# Patient Record
Sex: Male | Born: 1965 | Race: Black or African American | Hispanic: No | Marital: Single | Smoking: Former smoker
Health system: Southern US, Community
[De-identification: ages and names within clinical notes are randomized; demographics above are authoritative.]

## PROBLEM LIST (undated history)

## (undated) DIAGNOSIS — M542 Cervicalgia: Secondary | ICD-10-CM

## (undated) DIAGNOSIS — I10 Essential (primary) hypertension: Secondary | ICD-10-CM

## (undated) DIAGNOSIS — R29898 Other symptoms and signs involving the musculoskeletal system: Secondary | ICD-10-CM

## (undated) DIAGNOSIS — F419 Anxiety disorder, unspecified: Secondary | ICD-10-CM

## (undated) DIAGNOSIS — F32A Depression, unspecified: Secondary | ICD-10-CM

## (undated) DIAGNOSIS — R0600 Dyspnea, unspecified: Secondary | ICD-10-CM

## (undated) DIAGNOSIS — F329 Major depressive disorder, single episode, unspecified: Secondary | ICD-10-CM

## (undated) HISTORY — PX: NO PAST SURGERIES: SHX2092

## (undated) HISTORY — PX: MULTIPLE TOOTH EXTRACTIONS: SHX2053

---

## 2005-12-31 ENCOUNTER — Ambulatory Visit: Payer: Self-pay | Admitting: Otolaryngology

## 2011-08-10 ENCOUNTER — Emergency Department: Payer: Self-pay | Admitting: Emergency Medicine

## 2012-09-17 ENCOUNTER — Ambulatory Visit: Payer: Self-pay | Admitting: Family Medicine

## 2012-09-22 ENCOUNTER — Ambulatory Visit: Payer: Self-pay | Admitting: Family Medicine

## 2017-06-30 ENCOUNTER — Emergency Department: Payer: Self-pay

## 2017-06-30 ENCOUNTER — Encounter: Payer: Self-pay | Admitting: Emergency Medicine

## 2017-06-30 ENCOUNTER — Emergency Department
Admission: EM | Admit: 2017-06-30 | Discharge: 2017-06-30 | Disposition: A | Discharge status: Home / Self Care | Payer: Self-pay | Attending: Emergency Medicine | Admitting: Emergency Medicine

## 2017-06-30 DIAGNOSIS — F17210 Nicotine dependence, cigarettes, uncomplicated: Secondary | ICD-10-CM | POA: Insufficient documentation

## 2017-06-30 DIAGNOSIS — M4722 Other spondylosis with radiculopathy, cervical region: Secondary | ICD-10-CM | POA: Insufficient documentation

## 2017-06-30 DIAGNOSIS — I10 Essential (primary) hypertension: Secondary | ICD-10-CM | POA: Insufficient documentation

## 2017-06-30 DIAGNOSIS — R202 Paresthesia of skin: Secondary | ICD-10-CM | POA: Insufficient documentation

## 2017-06-30 DIAGNOSIS — M5410 Radiculopathy, site unspecified: Secondary | ICD-10-CM | POA: Insufficient documentation

## 2017-06-30 DIAGNOSIS — M542 Cervicalgia: Secondary | ICD-10-CM | POA: Insufficient documentation

## 2017-06-30 HISTORY — DX: Essential (primary) hypertension: I10

## 2017-06-30 MED ORDER — KETOROLAC TROMETHAMINE 30 MG/ML IJ SOLN
30.0000 mg | Freq: Once | INTRAMUSCULAR | Status: AC
  Administered 2017-06-30: 30 mg via INTRAMUSCULAR
  Filled 2017-06-30: qty 1

## 2017-06-30 MED ORDER — CYCLOBENZAPRINE HCL 5 MG PO TABS: ORAL_TABLET | ORAL | 0 refills | Status: DC

## 2017-06-30 MED ORDER — OXYCODONE-ACETAMINOPHEN 5-325 MG PO TABS
1.0000 | ORAL_TABLET | Freq: Once | ORAL | Status: AC
  Administered 2017-06-30: 1 via ORAL
  Filled 2017-06-30: qty 1

## 2017-06-30 MED ORDER — METHYLPREDNISOLONE SODIUM SUCC 125 MG IJ SOLR
125.0000 mg | Freq: Once | INTRAMUSCULAR | Status: AC
  Administered 2017-06-30: 125 mg via INTRAMUSCULAR
  Filled 2017-06-30: qty 2

## 2017-06-30 MED ORDER — IBUPROFEN 800 MG PO TABS: 800.0000 mg | ORAL_TABLET | Freq: Three times a day (TID) | ORAL | 0 refills | Status: DC | PRN

## 2017-06-30 MED ORDER — PREDNISONE 10 MG PO TABS: ORAL_TABLET | ORAL | 0 refills | Status: DC

## 2017-06-30 NOTE — ED Triage Notes (Signed)
Pt here with c/o neck pain that started a week ago, states he thinks he has a disc issue, states pain to right arm began last week as well with some tingling and occasional numbness in his fingers. Pt unable to sit still denies cp.

## 2017-06-30 NOTE — ED Provider Notes (Signed)
[facility] Emergency Department Provider Note  ____________________________________________  Time seen: Approximately 7:07 PM  I have reviewed the triage vital signs and the nursing notes.   HISTORY  Chief Complaint Neck Pain    HPI James Padilla is a 51 y.o. male that presents the emergency department for numbness and tingling in right arm for several years that worsened 1 month ago.  Patient states that he has a disc ruptured in his neck and causes him to get numbness and tingling in his right hand.  He had a CT scan done several years ago and was supposed to get surgery but never did.  Symptoms worsened 1 month ago.  He has been having difficulty sleeping this week due to tingling.  Symptoms have not changed in character but are more constant than they have been previously.  Wife has been rubbing cream on his arm without relief.  He denies any trauma.  No shortness of breath, chest pain, nausea, vomiting, abdominal pain.   Past Medical History:  Diagnosis Date  . Hypertension     There are no active problems to display for this patient.   History reviewed. No pertinent surgical history.  Prior to Admission medications   Medication Sig Start Date End Date Taking? Authorizing Provider  cyclobenzaprine (FLEXERIL) 5 MG tablet Take 1-2 tablets 3 times daily as needed 06/30/17   Laban Emperor, PA-C  ibuprofen (ADVIL,MOTRIN) 800 MG tablet Take 1 tablet (800 mg total) every 8 (eight) hours as needed by mouth. 06/30/17   Laban Emperor, PA-C  predniSONE (DELTASONE) 10 MG tablet Take 6 tablets on day 1, take 5 tablets on day 2, take 4 tablets on day 3, take 3 tablets on day 4, take 2 tablets on day 5, take 1 tablet on day 6 06/30/17   Laban Emperor, PA-C    Allergies Patient has no known allergies.  No family history on file.  Social History Social History   Tobacco Use  . Smoking status: Current Every Day Smoker    Packs/day: 1.00    Types: Cigarettes   . Smokeless tobacco: Never Used  Substance Use Topics  . Alcohol use: Yes    Comment: occas.   . Drug use: Yes    Comment: states not recently     Review of Systems  Constitutional: No fever/chills Cardiovascular: No chest pain. Respiratory: No cough. No SOB. Gastrointestinal: No abdominal pain.  No nausea, no vomiting.  Skin: Negative for rash, abrasions, lacerations, ecchymosis.   ____________________________________________   PHYSICAL EXAM:  VITAL SIGNS: ED Triage Vitals  Enc Vitals Group     BP 06/30/17 1628 (!) 173/99     Pulse Rate 06/30/17 1627 65     Resp 06/30/17 1627 18     Temp 06/30/17 1627 98.2 F (36.8 C)     Temp Source 06/30/17 1627 Oral     SpO2 06/30/17 1627 99 %     Weight 06/30/17 1628 142 lb (64.4 kg)     Height 06/30/17 1628 5\' 6"  (1.676 m)     Head Circumference --      Peak Flow --      Pain Score 06/30/17 1627 10     Pain Loc --      Pain Edu? --      Excl. in [place]? --      Constitutional: Alert and oriented. Well appearing and in no acute distress. Eyes: Conjunctivae are normal. PERRL. EOMI. Head: Atraumatic. ENT:  Ears:      Nose: No congestion/rhinnorhea.      Mouth/Throat: Mucous membranes are moist.  Neck: No stridor. No cervical spine tenderness to palpation. Cardiovascular: Normal rate, regular rhythm.  Good peripheral circulation.  Symmetric radial pulses bilaterally. Respiratory: Normal respiratory effort without tachypnea or retractions. Lungs CTAB. Good air entry to the bases with no decreased or absent breath sounds. Musculoskeletal: Full range of motion to all extremities. No gross deformities appreciated.  Tenderness to palpation throughout upper right back and right shoulder. Neurologic:  Normal speech and language. No gross focal neurologic deficits are appreciated.  Skin:  Skin is warm, dry and intact. No rash noted.   ____________________________________________   LABS (all labs ordered are listed, but only  abnormal results are displayed)  Labs Reviewed - No data to display ____________________________________________  EKG   ____________________________________________  RADIOLOGY Robinette Haines, personally viewed and evaluated these images (plain radiographs) as part of my medical decision making, as well as reviewing the written report by the radiologist.  Dg Cervical Spine 2-3 Views  Result Date: 06/30/2017 CLINICAL DATA:  Neck pain starting week ago. EXAM: CERVICAL SPINE - 2-3 VIEW COMPARISON:  09/22/2012 MRI, 09/17/2012 cervical spine radiographs. FINDINGS: Reversal of cervical lordosis with multilevel degenerative disc disease, mild at C2-3, C3-4 and moderate-to-marked from C4 through C7. No jumped or perched facets. No acute fracture. No suspicious osseous lesions. Uncovertebral joint osteoarthritis on the right at C3-4, bilaterally at C4-5 and C5-6. IMPRESSION: Cervical spondylosis with reversal of cervical lordosis. Multilevel degenerative disc and facet arthropathy with uncovertebral joint osteoarthritis. No acute fracture. Electronically Signed   By: Sharde Gover Royalty M.D.   On: 06/30/2017 18:06    ____________________________________________    PROCEDURES  Procedure(s) performed:    Procedures    Medications  oxyCODONE-acetaminophen (PERCOCET/ROXICET) 5-325 MG per tablet 1 tablet (1 tablet Oral Given 06/30/17 1736)  ketorolac (TORADOL) 30 MG/ML injection 30 mg (30 mg Intramuscular Given 06/30/17 1834)  methylPREDNISolone sodium succinate (SOLU-MEDROL) 125 mg/2 mL injection 125 mg (125 mg Intramuscular Given 06/30/17 1834)     ____________________________________________   INITIAL IMPRESSION / ASSESSMENT AND PLAN / ED COURSE  Pertinent labs & imaging results that were available during my care of the patient were reviewed by me and considered in my medical decision making (see chart for details).  Review of the [place] CSRS was performed in accordance of the [place] prior to  dispensing any controlled drugs.   Patient presented to the emergency department for worsening radicular symptoms.  Vital signs and exam are reassuring.  Cervical x-ray consistent with osteoarthritis and cervical spondylosis with reversal of cervical lordosis.  No changes on EKG.  Patient felt better after oxycodone. He was then given IM Toradol, Solu-Medrol.  Patient will be discharged home with prescriptions for prednisone, ibuprofen and Flexeril.  Patient will follow up with previous surgeon. Patient is given ED precautions to return to the ED for any worsening or new symptoms.     ____________________________________________  FINAL CLINICAL IMPRESSION(S) / ED DIAGNOSES  Final diagnoses:  Neck pain  Radiculopathy, unspecified spinal region  Osteoarthritis of spine with radiculopathy, cervical region      NEW MEDICATIONS STARTED DURING THIS VISIT:  This SmartLink is deprecated. Use AVSMEDLIST instead to display the medication list for a patient.      This chart was dictated using voice recognition software/Dragon. Despite best efforts to proofread, errors can occur which can change the meaning. Any change was purely unintentional.    Earleen Newport,  Genevie Cheshire 06/30/17 1912    Hinda Kehr, MD 06/30/17 [phone]

## 2017-09-30 ENCOUNTER — Other Ambulatory Visit: Payer: Self-pay

## 2017-09-30 ENCOUNTER — Encounter: Payer: Self-pay | Admitting: Emergency Medicine

## 2017-09-30 ENCOUNTER — Emergency Department
Admission: EM | Admit: 2017-09-30 | Discharge: 2017-09-30 | Disposition: A | Discharge status: Home / Self Care | Payer: Self-pay | Attending: Emergency Medicine | Admitting: Emergency Medicine

## 2017-09-30 DIAGNOSIS — I10 Essential (primary) hypertension: Secondary | ICD-10-CM | POA: Insufficient documentation

## 2017-09-30 DIAGNOSIS — L0291 Cutaneous abscess, unspecified: Secondary | ICD-10-CM

## 2017-09-30 DIAGNOSIS — L02212 Cutaneous abscess of back [any part, except buttock]: Secondary | ICD-10-CM | POA: Insufficient documentation

## 2017-09-30 DIAGNOSIS — F17210 Nicotine dependence, cigarettes, uncomplicated: Secondary | ICD-10-CM | POA: Insufficient documentation

## 2017-09-30 MED ORDER — LIDOCAINE HCL (PF) 1 % IJ SOLN
INTRAMUSCULAR | Status: AC
  Administered 2017-09-30: 10 mL via INTRADERMAL
  Filled 2017-09-30: qty 10

## 2017-09-30 MED ORDER — SULFAMETHOXAZOLE-TRIMETHOPRIM 800-160 MG PO TABS
1.0000 | ORAL_TABLET | Freq: Once | ORAL | Status: AC
  Administered 2017-09-30: 1 via ORAL
  Filled 2017-09-30: qty 1

## 2017-09-30 MED ORDER — SULFAMETHOXAZOLE-TRIMETHOPRIM 800-160 MG PO TABS: 1.0000 | ORAL_TABLET | Freq: Two times a day (BID) | ORAL | 0 refills | Status: DC

## 2017-09-30 MED ORDER — OXYCODONE-ACETAMINOPHEN 5-325 MG PO TABS: 1.0000 | ORAL_TABLET | Freq: Four times a day (QID) | ORAL | 0 refills | Status: DC | PRN

## 2017-09-30 MED ORDER — OXYCODONE HCL 5 MG PO TABS
10.0000 mg | ORAL_TABLET | Freq: Once | ORAL | Status: AC
  Administered 2017-09-30: 10 mg via ORAL
  Filled 2017-09-30: qty 2

## 2017-09-30 MED ORDER — LIDOCAINE HCL (PF) 1 % IJ SOLN
10.0000 mL | Freq: Once | INTRAMUSCULAR | Status: AC
  Administered 2017-09-30: 10 mL via INTRADERMAL
  Filled 2017-09-30: qty 10

## 2017-09-30 NOTE — ED Notes (Signed)
Patient educated about not driving or performing other critical tasks (such as operating heavy machinery, caring for infant/toddler/child) due to sedative nature of narcotic medications received while in the ED.  Pt/caregiver verbalized understanding.   

## 2017-09-30 NOTE — ED Notes (Signed)
Pt stating that he has a "knot on my back." Pt has a dressed area on the upper left portion of his back . Pt is unsure of fevers. Pt stating it drained a yellow/bloody drainage. Old drainage noted. No edema noted.

## 2017-09-30 NOTE — ED Triage Notes (Signed)
Presents with a possible abscess area to back

## 2017-09-30 NOTE — ED Notes (Signed)
ED Provider at bedside for procedure. 

## 2017-10-01 NOTE — ED Provider Notes (Signed)
[facility] Emergency Department Provider Note  ____________________________________________  Time seen: Approximately 7:07 PM  I have reviewed the triage vital signs and the nursing notes.   HISTORY  Chief Complaint Abscess   HPI James Padilla is a 52 y.o. male who presents to the emergency department for evaluation and treatment of a lesion on his left upper back that has been present for several months, but has not been tender or draining as it is now.  He denies fever.  He denies previous skin infection.  No alleviating measures have been attempted for this complaint prior to arrival.  Past Medical History:  Diagnosis Date  . Hypertension     There are no active problems to display for this patient.   History reviewed. No pertinent surgical history.  Prior to Admission medications   Medication Sig Start Date End Date Taking? Authorizing Provider  cyclobenzaprine (FLEXERIL) 5 MG tablet Take 1-2 tablets 3 times daily as needed 06/30/17   Laban Emperor, PA-C  ibuprofen (ADVIL,MOTRIN) 800 MG tablet Take 1 tablet (800 mg total) every 8 (eight) hours as needed by mouth. 06/30/17   Laban Emperor, PA-C  oxyCODONE-acetaminophen (PERCOCET) 5-325 MG tablet Take 1 tablet by mouth every 6 (six) hours as needed for severe pain. 09/30/17   Gem Ducre, Johnette Abraham B, FNP  predniSONE (DELTASONE) 10 MG tablet Take 6 tablets on day 1, take 5 tablets on day 2, take 4 tablets on day 3, take 3 tablets on day 4, take 2 tablets on day 5, take 1 tablet on day 6 06/30/17   Laban Emperor, PA-C  sulfamethoxazole-trimethoprim (BACTRIM DS,SEPTRA DS) 800-160 MG tablet Take 1 tablet by mouth 2 (two) times daily. 09/30/17   Victorino Dike, FNP    Allergies Patient has no known allergies.  No family history on file.  Social History Social History   Tobacco Use  . Smoking status: Current Every Day Smoker    Packs/day: 1.00    Types: Cigarettes  . Smokeless tobacco: Never Used  Substance  Use Topics  . Alcohol use: Yes    Comment: occas.   . Drug use: Yes    Comment: states not recently    Review of Systems  Constitutional: Negative for fever. Respiratory: Negative for cough or shortness of breath.  Musculoskeletal: Negative for myalgias Skin: Positive for lesion on the upper back Neurological: Negative for numbness or paresthesias. ____________________________________________   PHYSICAL EXAM:  VITAL SIGNS: ED Triage Vitals  Enc Vitals Group     BP 09/30/17 1845 (!) 147/87     Pulse Rate 09/30/17 2003 67     Resp 09/30/17 1845 20     Temp 09/30/17 1845 98.5 F (36.9 C)     Temp Source 09/30/17 1845 Oral     SpO2 09/30/17 1845 100 %     Weight 09/30/17 1846 140 lb (63.5 kg)     Height 09/30/17 1846 5\' 7"  (1.702 m)     Head Circumference --      Peak Flow --      Pain Score 09/30/17 1846 8     Pain Loc --      Pain Edu? --      Excl. in [place]? --      Constitutional: Well appearing. Eyes: Conjunctivae are clear without discharge or drainage. Nose: No rhinorrhea noted. Mouth/Throat: Airway is patent.  Neck: No stridor. Unrestricted range of motion observed.  Cardiovascular: Capillary refill is <3 seconds.  Respiratory: Respirations are even and unlabored.[name]  Musculoskeletal: Unrestricted range of motion observed. Neurologic: Awake, alert, and oriented x 4.  Skin: Approximately 5 cm fluctuant epidermal cyst that now has surrounding induration and erythema of the skin.  There is a scant amount of purulent drainage noted.  ____________________________________________   LABS (all labs ordered are listed, but only abnormal results are displayed)  Labs Reviewed - No data to display ____________________________________________  EKG  Not indicated ____________________________________________  RADIOLOGY  Not indicated ____________________________________________   PROCEDURES  .Marland KitchenIncision and Drainage Date/Time: 10/01/2017 12:05 AM Performed by:  Victorino Dike, FNP Authorized by: Victorino Dike, FNP   Consent:    Consent obtained:  Verbal   Consent given by:  Patient   Risks discussed:  Bleeding, infection, incomplete drainage and pain   Alternatives discussed:  Alternative treatment, delayed treatment and observation Location:    Type:  Abscess   Location:  Trunk   Trunk location:  Back Pre-procedure details:    Skin preparation:  Betadine Anesthesia (see MAR for exact dosages):    Anesthesia method:  Local infiltration   Local anesthetic:  Lidocaine 1% w/o epi Procedure type:    Complexity:  Complex Procedure details:    Incision types:  Stab incision   Scalpel blade:  11   Wound management:  Probed and deloculated   Drainage:  Purulent and bloody   Drainage amount:  Moderate   Wound treatment:  Drain placed   Packing materials:  1/4 in iodoform gauze Post-procedure details:    Patient tolerance of procedure:  Tolerated well, no immediate complications   ____________________________________________   INITIAL IMPRESSION / ASSESSMENT AND PLAN / ED COURSE  James Padilla is a 52 y.o. male who presents to the emergency department for treatment and evaluation of abscess to the left upper back.  Incision and drainage was completed and the patient tolerated the procedure well.  He is to return to the emergency department in 2 days for packing removal and reassessment of the wound.  He will be placed on Bactrim and given Percocet for pain.  He was advised to return to the emergency department sooner for symptoms that change or worsen.  Medications  lidocaine (PF) (XYLOCAINE) 1 % injection 10 mL (10 mLs Intradermal Given 09/30/17 1937)  oxyCODONE (Oxy IR/ROXICODONE) immediate release tablet 10 mg (10 mg Oral Given 09/30/17 2004)  sulfamethoxazole-trimethoprim (BACTRIM DS,SEPTRA DS) 800-160 MG per tablet 1 tablet (1 tablet Oral Given 09/30/17 2005)     Pertinent labs & imaging results that were available during my care of  the patient were reviewed by me and considered in my medical decision making (see chart for details). ____________________________________________   FINAL CLINICAL IMPRESSION(S) / ED DIAGNOSES  Final diagnoses:  Abscess    ED Discharge Orders        Ordered    sulfamethoxazole-trimethoprim (BACTRIM DS,SEPTRA DS) 800-160 MG tablet  2 times daily     09/30/17 2001    oxyCODONE-acetaminophen (PERCOCET) 5-325 MG tablet  Every 6 hours PRN     09/30/17 2001       Note:  This document was prepared using Dragon voice recognition software and may include unintentional dictation errors.    Victorino Dike, FNP 10/01/17 [place], [place], MD 10/01/17 1515

## 2017-10-02 ENCOUNTER — Emergency Department
Admission: EM | Admit: 2017-10-02 | Discharge: 2017-10-02 | Disposition: A | Discharge status: Home / Self Care | Payer: Self-pay | Attending: Emergency Medicine | Admitting: Emergency Medicine

## 2017-10-02 ENCOUNTER — Other Ambulatory Visit: Payer: Self-pay

## 2017-10-02 ENCOUNTER — Encounter: Payer: Self-pay | Admitting: Emergency Medicine

## 2017-10-02 DIAGNOSIS — F17210 Nicotine dependence, cigarettes, uncomplicated: Secondary | ICD-10-CM | POA: Insufficient documentation

## 2017-10-02 DIAGNOSIS — I10 Essential (primary) hypertension: Secondary | ICD-10-CM | POA: Insufficient documentation

## 2017-10-02 DIAGNOSIS — L723 Sebaceous cyst: Secondary | ICD-10-CM | POA: Insufficient documentation

## 2017-10-02 DIAGNOSIS — Z5189 Encounter for other specified aftercare: Secondary | ICD-10-CM | POA: Insufficient documentation

## 2017-10-02 MED ORDER — SULFAMETHOXAZOLE-TRIMETHOPRIM 800-160 MG PO TABS
1.0000 | ORAL_TABLET | Freq: Once | ORAL | Status: AC
  Administered 2017-10-02: 1 via ORAL
  Filled 2017-10-02: qty 1

## 2017-10-02 MED ORDER — OXYCODONE-ACETAMINOPHEN 5-325 MG PO TABS
1.0000 | ORAL_TABLET | Freq: Once | ORAL | Status: AC
  Administered 2017-10-02: 1 via ORAL
  Filled 2017-10-02: qty 1

## 2017-10-02 MED ORDER — LIDOCAINE HCL (PF) 1 % IJ SOLN
INTRAMUSCULAR | Status: AC
  Filled 2017-10-02: qty 5

## 2017-10-02 NOTE — ED Triage Notes (Signed)
States he was seen 2 days ago   Had abscess area lanced and packed    Here for packing removal

## 2017-10-02 NOTE — ED Provider Notes (Signed)
[facility] Emergency Department Provider Note   ____________________________________________   First MD Initiated Contact with Patient 10/02/17 1603     (approximate)  I have reviewed the triage vital signs and the nursing notes.   HISTORY  Chief Complaint Wound Check    HPI James Padilla is a 52 y.o. male patient here today for reevaluation of an abscess which was incised and drained 2 days ago.  Lesion was packed with iodoform gauze.  Patient reveals that he did not fill the prescription for the antibiotics and pain medication secondary to lack of funds.  Patient stated the lesion has worsened.  Patient rates pain as a 10/10.  No palates measured for complaint.  Past Medical History:  Diagnosis Date  . Hypertension     There are no active problems to display for this patient.   History reviewed. No pertinent surgical history.  Prior to Admission medications   Medication Sig Start Date End Date Taking? Authorizing Provider  cyclobenzaprine (FLEXERIL) 5 MG tablet Take 1-2 tablets 3 times daily as needed 06/30/17   Laban Emperor, PA-C  ibuprofen (ADVIL,MOTRIN) 800 MG tablet Take 1 tablet (800 mg total) every 8 (eight) hours as needed by mouth. 06/30/17   Laban Emperor, PA-C  oxyCODONE-acetaminophen (PERCOCET) 5-325 MG tablet Take 1 tablet by mouth every 6 (six) hours as needed for severe pain. 09/30/17   Triplett, Johnette Abraham B, FNP  predniSONE (DELTASONE) 10 MG tablet Take 6 tablets on day 1, take 5 tablets on day 2, take 4 tablets on day 3, take 3 tablets on day 4, take 2 tablets on day 5, take 1 tablet on day 6 06/30/17   Laban Emperor, PA-C  sulfamethoxazole-trimethoprim (BACTRIM DS,SEPTRA DS) 800-160 MG tablet Take 1 tablet by mouth 2 (two) times daily. 09/30/17   Victorino Dike, FNP    Allergies Patient has no known allergies.  No family history on file.  Social History Social History   Tobacco Use  . Smoking status: Current Every Day Smoker   Packs/day: 1.00    Types: Cigarettes  . Smokeless tobacco: Never Used  Substance Use Topics  . Alcohol use: Yes    Comment: occas.   . Drug use: Yes    Comment: states not recently    Review of Systems  Constitutional: No fever/chills Eyes: No visual changes. ENT: No sore throat. Cardiovascular: Denies chest pain. Respiratory: Denies shortness of breath. Gastrointestinal: No abdominal pain.  No nausea, no vomiting.  No diarrhea.  No constipation. Genitourinary: Negative for dysuria. Musculoskeletal: Negative for back pain. Skin: Negative for rash.  Abscess upper back Neurological: Negative for headaches, focal weakness or numbness. Endocrine:Hypertension   ____________________________________________   PHYSICAL EXAM:  VITAL SIGNS: ED Triage Vitals [10/02/17 1528]  Enc Vitals Group     BP 131/90     Pulse Rate 75     Resp 18     Temp 98.5 F (36.9 C)     Temp Source Oral     SpO2 98 %     Weight 138 lb (62.6 kg)     Height 5\' 6"  (1.676 m)     Head Circumference      Peak Flow      Pain Score      Pain Loc      Pain Edu?      Excl. in [place]?    Constitutional: Alert and oriented. Well appearing and in no acute distress. Cardiovascular: Normal rate, regular rhythm. Grossly normal  heart sounds.  Good peripheral circulation. Respiratory: Normal respiratory effort.  No retractions. Lungs CTAB. Neurologic:  Normal speech and language. No gross focal neurologic deficits are appreciated. No gait instability. Skin:  Skin is warm, dry and intact.  Nodular lesion on erythematous base upper back.   Psychiatric: Mood and affect are normal. Speech and behavior are normal.  ____________________________________________   LABS (all labs ordered are listed, but only abnormal results are displayed)  Labs Reviewed - No data to display ____________________________________________  EKG   ____________________________________________  RADIOLOGY  ED MD interpretation:     Official radiology report(s): No results found.  ____________________________________________   PROCEDURES  Procedure(s) performed: None  Procedures  Critical Care performed: No  ____________________________________________   INITIAL IMPRESSION / ASSESSMENT AND PLAN / ED COURSE  As part of my medical decision making, I reviewed the following data within the electronic MEDICAL RECORD NUMBER    Infected sebaceous cyst upper back.  Poor prognosis secondary to noncompliance of prescription medication.  Packing material was removed.  Lesion was irrigated and purulent material expressed.  Lesion was repacked and bandaged.  Patient states that he can afford the antibiotics and pain medication this evening.  Advised to return back in 2 days for wound check.      ____________________________________________   FINAL CLINICAL IMPRESSION(S) / ED DIAGNOSES  Final diagnoses:  Wound check, abscess     ED Discharge Orders    None       Note:  This document was prepared using Dragon voice recognition software and may include unintentional dictation errors.    Sable Feil, PA-C 10/02/17 1610    Schaevitz, Randall An, MD 10/02/17 [phone]

## 2017-10-02 NOTE — Discharge Instructions (Signed)
Medication as directed

## 2017-10-04 ENCOUNTER — Emergency Department
Admission: EM | Admit: 2017-10-04 | Discharge: 2017-10-04 | Disposition: A | Discharge status: Home / Self Care | Payer: Self-pay | Attending: Emergency Medicine | Admitting: Emergency Medicine

## 2017-10-04 ENCOUNTER — Encounter: Payer: Self-pay | Admitting: Emergency Medicine

## 2017-10-04 ENCOUNTER — Other Ambulatory Visit: Payer: Self-pay

## 2017-10-04 DIAGNOSIS — I10 Essential (primary) hypertension: Secondary | ICD-10-CM | POA: Insufficient documentation

## 2017-10-04 DIAGNOSIS — F17210 Nicotine dependence, cigarettes, uncomplicated: Secondary | ICD-10-CM | POA: Insufficient documentation

## 2017-10-04 DIAGNOSIS — L02212 Cutaneous abscess of back [any part, except buttock]: Secondary | ICD-10-CM | POA: Insufficient documentation

## 2017-10-04 DIAGNOSIS — Z5189 Encounter for other specified aftercare: Secondary | ICD-10-CM | POA: Insufficient documentation

## 2017-10-04 NOTE — ED Triage Notes (Signed)
Wound reckeck, states abscess drained 2 and 4 days ago.

## 2017-10-04 NOTE — Discharge Instructions (Signed)
Continue to wear dressing over the top of your wound site as needed for drainage.  Continue taking antibiotics as directed.  Begin using warm moist compresses to the area Saturday and Sunday. [facility]-up with [facility] if any continued problems.

## 2017-10-04 NOTE — ED Provider Notes (Signed)
[facility] Emergency Department Provider Note  ____________________________________________   First MD Initiated Contact with Patient 10/04/17 1025     (approximate)  I have reviewed the triage vital signs and the nursing notes.   HISTORY  Chief Complaint Wound Check   HPI James Padilla is a 52 y.o. male is here for recheck of his abscess I&D.  Patient had an abscess on his back drained 4 days ago and returned 2 days ago where it was repacked again.  On his second visit it was discovered that patient was not taking his antibiotics.  Since that time patient has gotten his antibiotics and been taking them.  He denies any problems with his back at this time.   Past Medical History:  Diagnosis Date  . Hypertension     There are no active problems to display for this patient.   History reviewed. No pertinent surgical history.  Prior to Admission medications   Medication Sig Start Date End Date Taking? Authorizing Provider  oxyCODONE-acetaminophen (PERCOCET) 5-325 MG tablet Take 1 tablet by mouth every 6 (six) hours as needed for severe pain. 09/30/17   Triplett, Johnette Abraham B, FNP  sulfamethoxazole-trimethoprim (BACTRIM DS,SEPTRA DS) 800-160 MG tablet Take 1 tablet by mouth 2 (two) times daily. 09/30/17   Victorino Dike, FNP    Allergies Patient has no known allergies.  No family history on file.  Social History Social History   Tobacco Use  . Smoking status: Current Every Day Smoker    Packs/day: 1.00    Types: Cigarettes  . Smokeless tobacco: Never Used  Substance Use Topics  . Alcohol use: Yes    Comment: occas.   . Drug use: Yes    Comment: states not recently    Review of Systems Constitutional: No fever/chills Eyes: No visual changes. Cardiovascular: Denies chest pain. Respiratory: Denies shortness of breath. Musculoskeletal: Negative for back pain. Skin: Positive for abscess. ___________________________________________   PHYSICAL  EXAM:  VITAL SIGNS: ED Triage Vitals  Enc Vitals Group     BP 10/04/17 1007 (!) 141/90     Pulse Rate 10/04/17 1007 66     Resp 10/04/17 1007 18     Temp 10/04/17 1007 (!) 97.4 F (36.3 C)     Temp Source 10/04/17 1007 Oral     SpO2 10/04/17 1007 98 %     Weight 10/04/17 1009 138 lb (62.6 kg)     Height 10/04/17 1009 5\' 6"  (1.676 m)     Head Circumference --      Peak Flow --      Pain Score 10/04/17 1009 7     Pain Loc --      Pain Edu? --      Excl. in [place]? --    Constitutional: Alert and oriented. Well appearing and in no acute distress. Eyes: Conjunctivae are normal.  Head: Atraumatic. Neck: No stridor.   Cardiovascular: Normal rate, regular rhythm. Grossly normal heart sounds.  Good peripheral circulation. Respiratory: Normal respiratory effort.  No retractions. Lungs CTAB. Musculoskeletal: Moves upper and lower extremities without any difficulty and normal gait was noted. Neurologic:  Normal speech and language. No gross focal neurologic deficits are appreciated.  Skin:  Skin is warm, dry.  Abscess mid back.  No active drainage is noted.  No surrounding cellulitis. Psychiatric: Mood and affect are normal. Speech and behavior are normal.  ____________________________________________   LABS (all labs ordered are listed, but only abnormal results are displayed)  Labs  Reviewed - No data to display   PROCEDURES  Procedure(s) performed: Packing removed by myself.  Procedures  Critical Care performed: No  ____________________________________________   INITIAL IMPRESSION / ASSESSMENT AND PLAN / ED COURSE  The packing was removed and the area does appear to be healing.  There was no active drainage today.  Patient is encouraged to continue taking his antibiotics until completely finished.  Patient was given a note to return to work on Monday.  He is to follow-up with [facility]  ____________________________________________   FINAL CLINICAL IMPRESSION(S) / ED DIAGNOSES  Final diagnoses:  Wound check, abscess     ED Discharge Orders    None       Note:  This document was prepared using Dragon voice recognition software and may include unintentional dictation errors.    Johnn Hai, PA-C 10/04/17 1152    Schuyler Amor, MD 10/04/17 [phone]

## 2018-03-06 ENCOUNTER — Ambulatory Visit
Admission: RE | Admit: 2018-03-06 | Discharge: 2018-03-06 | Disposition: A | Discharge status: Home / Self Care | Payer: Disability Insurance | Source: Ambulatory Visit | Attending: Dentistry | Admitting: Dentistry

## 2018-03-06 ENCOUNTER — Other Ambulatory Visit: Payer: Self-pay | Admitting: Dentistry

## 2018-03-06 DIAGNOSIS — I10 Essential (primary) hypertension: Secondary | ICD-10-CM | POA: Diagnosis not present

## 2018-03-06 DIAGNOSIS — M545 Low back pain: Secondary | ICD-10-CM

## 2018-03-06 DIAGNOSIS — I700 Atherosclerosis of aorta: Secondary | ICD-10-CM | POA: Diagnosis not present

## 2018-03-06 DIAGNOSIS — F329 Major depressive disorder, single episode, unspecified: Secondary | ICD-10-CM | POA: Insufficient documentation

## 2018-03-06 DIAGNOSIS — M5030 Other cervical disc degeneration, unspecified cervical region: Secondary | ICD-10-CM | POA: Insufficient documentation

## 2018-07-11 ENCOUNTER — Emergency Department: Payer: Disability Insurance

## 2018-07-11 ENCOUNTER — Other Ambulatory Visit: Payer: Self-pay

## 2018-07-11 ENCOUNTER — Encounter: Payer: Self-pay | Admitting: Emergency Medicine

## 2018-07-11 ENCOUNTER — Emergency Department
Admission: EM | Admit: 2018-07-11 | Discharge: 2018-07-11 | Disposition: A | Discharge status: Home / Self Care | Payer: Disability Insurance | Attending: Emergency Medicine | Admitting: Emergency Medicine

## 2018-07-11 DIAGNOSIS — M79601 Pain in right arm: Secondary | ICD-10-CM | POA: Insufficient documentation

## 2018-07-11 DIAGNOSIS — Z79899 Other long term (current) drug therapy: Secondary | ICD-10-CM | POA: Insufficient documentation

## 2018-07-11 DIAGNOSIS — F17210 Nicotine dependence, cigarettes, uncomplicated: Secondary | ICD-10-CM | POA: Insufficient documentation

## 2018-07-11 DIAGNOSIS — M5412 Radiculopathy, cervical region: Secondary | ICD-10-CM | POA: Insufficient documentation

## 2018-07-11 DIAGNOSIS — I10 Essential (primary) hypertension: Secondary | ICD-10-CM | POA: Insufficient documentation

## 2018-07-11 LAB — BASIC METABOLIC PANEL
ANION GAP: 7 (ref 5–15)
BUN: 11 mg/dL (ref 6–20)
CHLORIDE: 107 mmol/L (ref 98–111)
CO2: 25 mmol/L (ref 22–32)
Calcium: 8.9 mg/dL (ref 8.9–10.3)
Creatinine, Ser: 1.3 mg/dL — ABNORMAL HIGH (ref 0.61–1.24)
GFR calc non Af Amer: >60 mL/min (ref >60)
GLUCOSE: 95 mg/dL (ref 70–99)
POTASSIUM: 3.6 mmol/L (ref 3.5–5.1)
Sodium: 139 mmol/L (ref 135–145)

## 2018-07-11 LAB — CBC WITH DIFFERENTIAL/PLATELET
Abs Immature Granulocytes: 0.01 10*3/uL (ref 0.00–0.07)
BASOS ABS: 0 10*3/uL (ref 0.0–0.1)
Basophils Relative: 1 %
EOS ABS: 0.2 10*3/uL (ref 0.0–0.5)
EOS PCT: 5 %
HCT: 44.3 % (ref 39.0–52.0)
HEMOGLOBIN: 15.1 g/dL (ref 13.0–17.0)
IMMATURE GRANULOCYTES: 0 %
Lymphocytes Relative: 46 %
Lymphs Abs: 1.9 10*3/uL (ref 0.7–4.0)
MCH: 34.6 pg — ABNORMAL HIGH (ref 26.0–34.0)
MCHC: 34.1 g/dL (ref 30.0–36.0)
MCV: 101.4 fL — AB (ref 80.0–100.0)
MONO ABS: 0.3 10*3/uL (ref 0.1–1.0)
Monocytes Relative: 8 %
NEUTROS ABS: 1.6 10*3/uL — AB (ref 1.7–7.7)
Neutrophils Relative %: 40 %
Platelets: 158 10*3/uL (ref 150–400)
RBC: 4.37 MIL/uL (ref 4.22–5.81)
RDW: 12.2 % (ref 11.5–15.5)
WBC: 4 10*3/uL (ref 4.0–10.5)
nRBC: 0 % (ref 0.0–0.2)

## 2018-07-11 MED ORDER — KETOROLAC TROMETHAMINE 60 MG/2ML IM SOLN
60.0000 mg | Freq: Once | INTRAMUSCULAR | Status: AC
  Administered 2018-07-11: 60 mg via INTRAMUSCULAR
  Filled 2018-07-11: qty 2

## 2018-07-11 MED ORDER — DEXAMETHASONE SODIUM PHOSPHATE 10 MG/ML IJ SOLN
10.0000 mg | Freq: Once | INTRAMUSCULAR | Status: AC
  Administered 2018-07-11: 10 mg via INTRAMUSCULAR
  Filled 2018-07-11: qty 1

## 2018-07-11 NOTE — ED Triage Notes (Addendum)
Pt here for right arm weakness and pain.  Has had pain from neck/upper back for years but over last week started with numbness and weakness.  Very weak/minimal grip in right arm; pt reports unable to use knife today. Arm is numb.

## 2018-07-11 NOTE — ED Notes (Signed)
R radial pulse 2+; SpO2 monitor attached to pointer finger of R hand (100% RA); warm; appropriate color.

## 2018-07-11 NOTE — Discharge Instructions (Addendum)
Please seek medical attention for any high fevers, chest pain, shortness of breath, change in behavior, persistent vomiting, bloody stool or any other new or concerning symptoms.  

## 2018-07-11 NOTE — ED Notes (Signed)
Pt reports MRI 2 months ago "across street" but cannot see any results.

## 2018-07-11 NOTE — ED Provider Notes (Signed)
[facility] Emergency Department Provider Note   ____________________________________________   I have reviewed the triage vital signs and the nursing notes.   HISTORY  Chief Complaint Extremity Weakness and Arm Pain   History limited by: Not Limited   HPI James Padilla is a 52 y.o. male who presents to the emergency department today with continued right upper extremity weakness and pain.  The patient has had the symptoms for months.  He states that he feels like the symptoms are getting worse.  He does have difficulty holding onto objects with his right hand.  Additionally he complains of intermittent feelings of coolness to that right upper extremity.  This is worse around the fifth digit.  The patient has seen neurosurgery in the past for this and states he tried contacting their office Friday.  Denies any recent trauma to his shoulder or arm.  Per medical record review patient has a history of HTN  Past Medical History:  Diagnosis Date  . Hypertension     There are no active problems to display for this patient.   History reviewed. No pertinent surgical history.  Prior to Admission medications   Medication Sig Start Date End Date Taking? Authorizing Provider  oxyCODONE-acetaminophen (PERCOCET) 5-325 MG tablet Take 1 tablet by mouth every 6 (six) hours as needed for severe pain. 09/30/17   Triplett, Johnette Abraham B, FNP  sulfamethoxazole-trimethoprim (BACTRIM DS,SEPTRA DS) 800-160 MG tablet Take 1 tablet by mouth 2 (two) times daily. 09/30/17   Victorino Dike, FNP    Allergies Patient has no known allergies.  History reviewed. No pertinent family history.  Social History Social History   Tobacco Use  . Smoking status: Current Every Day Smoker    Packs/day: 1.00    Types: Cigarettes  . Smokeless tobacco: Never Used  Substance Use Topics  . Alcohol use: Yes    Comment: occas.   . Drug use: Yes    Comment: states not recently    Review of  Systems Constitutional: No fever/chills Eyes: No visual changes. ENT: No sore throat. Cardiovascular: Denies chest pain. Respiratory: Denies shortness of breath. Gastrointestinal: No abdominal pain.  No nausea, no vomiting.  No diarrhea.   Genitourinary: Negative for dysuria. Musculoskeletal: Positive for right upper extremity weakness and pain. Skin: Negative for rash. Neurological: Negative for headaches, focal weakness or numbness.  ____________________________________________   PHYSICAL EXAM:  VITAL SIGNS: ED Triage Vitals  Enc Vitals Group     BP 07/11/18 1738 (!) 155/109     Pulse Rate 07/11/18 1738 83     Resp 07/11/18 1738 20     Temp 07/11/18 1738 (!) 97.4 F (36.3 C)     Temp Source 07/11/18 1738 Oral     SpO2 07/11/18 1738 100 %     Weight 07/11/18 1740 132 lb (59.9 kg)     Height 07/11/18 1740 5\' 4"  (1.626 m)     Head Circumference --      Peak Flow --      Pain Score 07/11/18 1740 10   Constitutional: Alert and oriented.  Eyes: Conjunctivae are normal.  ENT      Head: Normocephalic and atraumatic.      Nose: No congestion/rhinnorhea.      Mouth/Throat: Mucous membranes are moist.      Neck: No stridor. Hematological/Lymphatic/Immunilogical: No cervical lymphadenopathy. Cardiovascular: Normal rate, regular rhythm.  No murmurs, rubs, or gallops.  Respiratory: Normal respiratory effort without tachypnea nor retractions. Breath sounds are clear and  equal bilaterally. No wheezes/rales/rhonchi. Gastrointestinal: Soft and non tender. No rebound. No guarding.  Genitourinary: Deferred Musculoskeletal: Weakness to right upper extremity, radial and ulnar pulse 2+. No deformity. Neurologic:  Normal speech and language. Right upper extremity weakness. Skin:  Skin is warm, dry and intact. No rash noted. Psychiatric: Mood and affect are normal. Speech and behavior are normal. Patient exhibits appropriate insight and  judgment.  ____________________________________________    LABS (pertinent positives/negatives)  None  ____________________________________________   EKG  None  ____________________________________________    RADIOLOGY  None  ____________________________________________   PROCEDURES  Procedures  ____________________________________________   INITIAL IMPRESSION / ASSESSMENT AND PLAN / ED COURSE  Pertinent labs & imaging results that were available during my care of the patient were reviewed by me and considered in my medical decision making (see chart for details).   Patient presented to the emergency department because of concern for continued and worsening right arm pain and weakness. This has been an ongoing problem for the patient for months and he has been evaluated by neurosurgery. Does not appear that any new symptoms have developed recently. Good distal pulses. At this point do not feel any emergent re imaging would be beneficial.  Will give patient shot of steroids and toradol. Discussed importance of follow up with neurosurgery.  ____________________________________________   FINAL CLINICAL IMPRESSION(S) / ED DIAGNOSES  Final diagnoses:  Right arm pain  Cervical radiculopathy     Note: This dictation was prepared with Dragon dictation. Any transcriptional errors that result from this process are unintentional     Nance Pear, MD 07/11/18 2256

## 2018-07-11 NOTE — ED Notes (Signed)
Discussed concern of symptoms with dr Joni Fears. Orders placed

## 2018-07-11 NOTE — ED Notes (Signed)
Some home meds left at bedside when pt left. Sent to pharm.

## 2018-07-11 NOTE — ED Triage Notes (Signed)
Last MRI June of this year, care everywhere

## 2018-08-03 DIAGNOSIS — M5010 Cervical disc disorder with radiculopathy, unspecified cervical region: Secondary | ICD-10-CM | POA: Diagnosis not present

## 2018-08-03 DIAGNOSIS — M5412 Radiculopathy, cervical region: Secondary | ICD-10-CM | POA: Diagnosis not present

## 2018-08-14 DIAGNOSIS — M79601 Pain in right arm: Secondary | ICD-10-CM | POA: Diagnosis not present

## 2018-08-27 DIAGNOSIS — M79601 Pain in right arm: Secondary | ICD-10-CM | POA: Diagnosis not present

## 2018-08-27 DIAGNOSIS — M4802 Spinal stenosis, cervical region: Secondary | ICD-10-CM | POA: Diagnosis not present

## 2018-08-27 DIAGNOSIS — R531 Weakness: Secondary | ICD-10-CM | POA: Diagnosis not present

## 2018-08-27 DIAGNOSIS — M2578 Osteophyte, vertebrae: Secondary | ICD-10-CM | POA: Diagnosis not present

## 2018-08-27 DIAGNOSIS — M4722 Other spondylosis with radiculopathy, cervical region: Secondary | ICD-10-CM | POA: Diagnosis not present

## 2018-09-18 ENCOUNTER — Ambulatory Visit: Payer: Medicaid Other | Admitting: Family Medicine

## 2018-09-18 ENCOUNTER — Ambulatory Visit: Payer: Self-pay

## 2018-09-18 ENCOUNTER — Encounter: Payer: Self-pay | Admitting: Family Medicine

## 2018-09-18 VITALS — BP 140/86 | HR 98 | Temp 98.1°F | Resp 18 | Ht 65.0 in | Wt 127.2 lb

## 2018-09-18 DIAGNOSIS — G8929 Other chronic pain: Secondary | ICD-10-CM

## 2018-09-18 DIAGNOSIS — Z5900 Homelessness unspecified: Secondary | ICD-10-CM

## 2018-09-18 DIAGNOSIS — M542 Cervicalgia: Secondary | ICD-10-CM

## 2018-09-18 DIAGNOSIS — M549 Dorsalgia, unspecified: Secondary | ICD-10-CM

## 2018-09-18 NOTE — Patient Instructions (Signed)
1. Please consider contacting [place] for emergency housing. 2. Utilize resources given today for emergency housing and hot meals. 3. Referral will be placed to [facility] for additional housing resources. I will provide [name] contact information per your request to them. 4. CCM RN CM will follow up with you next week.  James Padilla was given information about Care Management services today including:  1. Case Management services includes personalized support from designated clinical staff supervised by his physician, including individualized plan of care and coordination with other care providers 2. 24/7 contact phone numbers for assistance for urgent and routine care needs. 3. The patient may stop case management services at any time by phone call to the office staff.  Patient agreed to services and verbal consent obtained.    CCM (Chronic Care Management) Team   Trish Fountain RN, BSN Nurse Care Coordinator  [phone]  Ruben Reason PharmD  Clinical Pharmacist  [phone]   Goals Addressed            This Visit's Progress   . "I am living out of my truck" (pt-stated)       .Current Barriers:  [name] Knowledge of housing resources . finances   Nurse Case Manager Clinical Goal(s): Patient will verbalize utilization of resources for emergency housing given today  Interventions:  . Provided patient via telephone contact information for emergency housing and hot meal including  [facility] [address] [place] [phone]  [place] [facility] [employer] [facility] [employer]) [phone] [address] [place] Meals Sat 1130-130   Sunday Noon-200   . Place referral to C3    *initial goal documentation

## 2018-09-18 NOTE — Chronic Care Management (AMB) (Signed)
  Care Management   Initial Visit Note  09/18/2018 Name: James Padilla MRN: [id number] DOB: 21-Jul-1966  Referred by: James Ensign, FNP (PCP) Reason for referral : Assistance with emergency housing  Subjective: "I was living in a group home but it got too expensive so I moved out and now I live in my truck"  Objective:  Assessment: [name] is a 53 year old patient of James Ensign, FNP who was in the office today to establish care with her for primary care. CCM RN CM received a call from James Padilla requesting assistance from the Chronic Care Management team for emergency housing. CCM RN CM spoke with patient via telephone. Patients only medical history provided in EMR is ongoing shoulder, neck and back pain that is followed by [facility] neurosurgery.  Review of patient status, including review of consultants reports, relevant laboratory and other test results, and collaboration with appropriate care team members and the patient's provider was performed as part of comprehensive patient evaluation and provision of chronic care management services.    4 ED visit and 0 inpatient admissions in the last 12 months  <no information>  Goals Addressed            This Visit's Progress   . "I am living out of my truck" (pt-stated)       .James Padilla admits to homelessness. He is currently living out of his truck. He is in a relationship with "James Padilla" who presents with him at todays appointment. Per James Padilla, she lives in a home with her family who will not provide James Padilla with shelter because they are not married. James Padilla recently moved out of a group home because it became to expensive. James Padilla has been approved for food stamps but limited in the foods he can buy secondary to not having a home. Per James Padilla, he does not want to utilize homeless shelters housing in surrounding cities such as [place]. He has contacted [facility] as was told they are not accepting new residents at this  time.  Current Barriers:  [name] Knowledge of housing resources . finances   Nurse Case Manager Clinical Goal(s): Patient will verbalize utilization of resources for emergency housing given today  Interventions:  . Provided patient via telephone contact information for emergency housing and hot meal including  [facility] [address] [place] [phone]  [facility] [employer]) [phone] [address] [place] Meals Sat 1130-130   Sunday Noon-200   . Place referral to C3    *initial goal documentation         Follow up plan:  The CM team will reach out to the patient again over the next 7 days.   James Padilla was given information about Care Management services today including:  1. Case Management services include personalized support from designated clinical staff supervised by a physician, including individualized plan of care and coordination with other care providers 2. 24/7 contact phone numbers for assistance for urgent and routine care needs. 3. The patient may stop CCM services at any time (effective at the end of the month) by phone call to the office staff.  Patient agreed to services and verbal consent obtained.    James Padilla E. Rollene Rotunda, RN, BSN Nurse Care Coordinator [facility] Care Management  [phone]

## 2018-09-18 NOTE — Patient Instructions (Signed)
[employer] [facility] [place]

## 2018-09-18 NOTE — Progress Notes (Signed)
Name: James Padilla   MRN: [id number]    DOB: [birth year]   Date:09/19/2018       Progress Note  Subjective  Chief Complaint  Chief Complaint  Patient presents with  . Establish Care  . Shoulder Pain    right, painful, hard to sleep, numbness in hands, can't write  . Back Pain  . Neck Pain    HPI  Pt presents to establish care and for the following concerns:  Homelessness: Sleeping in his car right now, but it is very cold outside right now.  He was staying at a boarding house, but wasn't able to afford it any longer. He states he has called the homeless shelters in [place] and none have openings. He does not have any family or friends that he is able to stay with right now.  We will connect him with resources today.  RIGHT shoulder, neck, and upper back pain: Ongoing for many years, but states worsening lately.  He states the pain is unbearable.  Has been taking gabapentin and tramadol without relief of pain.  He is working on obtaining disability. I advised patient of our office policy regarding opiate prescriptions, and that I will not be prescribing any additional pain medications, he verbalizes understanding.  Has been seeing [facility] neurosurgery - he last spoke with Neurosurgery on 09/11/2018 and he was able to make a follow up appointment with them in 2 weeks.    Patient Active Problem List   Diagnosis Date Noted  . Homelessness 09/19/2018  . Chronic neck pain 09/19/2018  . Chronic bilateral back pain 09/19/2018  . Right arm pain 08/14/2018   History reviewed. No pertinent surgical history.  History reviewed. No pertinent family history.  Social History   Socioeconomic History  . Marital status: Single    Spouse name: Not on file  . Number of children: Not on file  . Years of education: Not on file  . Highest education level: Not on file  Occupational History  . Occupation: unemployed  Social Needs  . Financial resource strain: Very hard  . Food insecurity:      Worry: Often true    Inability: Often true  . Transportation needs:    Medical: Yes    Non-medical: Yes  Tobacco Use  . Smoking status: Current Every Day Smoker    Packs/day: 1.00    Types: Cigarettes  . Smokeless tobacco: Never Used  Substance and Sexual Activity  . Alcohol use: Yes    Comment: occas.   . Drug use: Yes    Types: Marijuana, Cocaine  . Sexual activity: Yes    Partners: Female  Lifestyle  . Physical activity:    Days per week: 0 days    Minutes per session: 0 min  . Stress: Very much  Relationships  . Social connections:    Talks on phone: Not on file    Gets together: Not on file    Attends religious service: Not on file    Active member of club or organization: Not on file    Attends meetings of clubs or organizations: Not on file    Relationship status: Not on file  . Intimate partner violence:    Fear of current or ex partner: Not on file    Emotionally abused: Not on file    Physically abused: Not on file    Forced sexual activity: Not on file  Other Topics Concern  . Not on file  Social History Narrative  .  Not on file     Current Outpatient Medications:  .  traMADol (ULTRAM) 50 MG tablet, Take 50 mg by mouth every 8 (eight) hours as needed., Disp: , Rfl:   No Known Allergies  I personally reviewed active problem list, medication list, allergies, social history, notes from last encounter, lab results with the patient/caregiver today.   ROS Constitutional: Negative for fever or weight change.  Respiratory: Negative for cough and shortness of breath.   Cardiovascular: Negative for chest pain or palpitations.  Gastrointestinal: Negative for abdominal pain, no bowel changes.  Musculoskeletal: See HPI Skin: Negative for rash.  Neurological: Negative for dizziness or headache.  No other specific complaints in a complete review of systems (except as listed in HPI above).   Objective  Vitals:   09/18/18 1259  BP: 140/86  Pulse: 98   Resp: 18  Temp: 98.1 F (36.7 C)  TempSrc: Oral  SpO2: 99%  Weight: 127 lb 3.2 oz (57.7 kg)  Height: 5\' 5"  (1.651 m)    Body mass index is 21.17 kg/m.  Physical Exam Constitutional: Patient appears well-developed and well-nourished. No distress.  HENT: Head: Normocephalic and atraumatic. Ears: bilateral TMs with no erythema or effusion; Nose: Nose normal. Mouth/Throat: Oropharynx is clear and moist. No oropharyngeal exudate or tonsillar swelling.  Eyes: Conjunctivae and EOM are normal. No scleral icterus.  Pupils are equal, round, and reactive to light.  Neck: Normal range of motion. Neck supple. No JVD present. No thyromegaly present.  Cardiovascular: Normal rate, regular rhythm and normal heart sounds.  No murmur heard. No BLE edema. Pulmonary/Chest: Effort normal and breath sounds normal. No respiratory distress. Abdominal: Soft. Bowel sounds are normal, no distension. There is no tenderness. No masses. Musculoskeletal: Some limited AROM of the RUE at the shoulder, otherwise moves all extremities normally, no joint effusions. No gross deformities Neurological: Pt is alert and oriented to person, place, and time. No cranial nerve deficit. Coordination, balance, speech and gait are normal. RUE has weakened grip and strength of shoulder joint. Skin: Skin is warm and dry. No rash noted. No erythema.  Psychiatric: Patient has a normal mood and affect. behavior is normal. Judgment and thought content normal.  No results found for this or any previous visit (from the past 72 hour(s)).  PHQ2/9: Depression screen [facility] 2/9 09/18/2018  Decreased Interest 0  Down, Depressed, Hopeless 0  PHQ - 2 Score 0  Altered sleeping 0  Tired, decreased energy 0  Change in appetite 0  Feeling bad or failure about yourself  0  Trouble concentrating 0  Moving slowly or fidgety/restless 0  Suicidal thoughts 0  PHQ-9 Score 0  Difficult doing work/chores Not difficult at all    Fall Risk: Fall Risk   09/18/2018  Falls in the past year? 0  Number falls in past yr: 0  Injury with Fall? 0  Follow up Falls evaluation completed    Assessment & Plan  1. Homelessness - Patient is able to speak directly to Truddie Crumble, RN, Nurse Case Manager via telephone while in our office today.  He is provided with several housing options as well as foodbank options for his immediate needs, and she with follow up with the patient after the weekend for additional assistance. - Ambulatory referral to Chronic Care Management Services  2. Chronic neck pain - Advised several times throughout the visit that I am not able to prescribe any additional opiate therapy as he needs to follow up with the neurosurgery group that has  been following his case. Despite his expressed frustration, he does verbalize understanding. - Ambulatory referral to Chronic Care Management Services  3. Chronic bilateral back pain, unspecified back location - See above. - Ambulatory referral to Chronic Care Management Services  Health Maintenance: Unfortunately, our visit was limited by the patient's immediate housing needs and discussion around his chronic pain.  We will plan to follow up in 2 weeks to better address health maintenance and any additional medical problems that were not able to be addressed today. Return in about 2 weeks (around 10/02/2018) for Follow Up.

## 2018-09-19 ENCOUNTER — Encounter: Payer: Self-pay | Admitting: Family Medicine

## 2018-09-19 DIAGNOSIS — Z5900 Homelessness unspecified: Secondary | ICD-10-CM | POA: Insufficient documentation

## 2018-09-19 DIAGNOSIS — M549 Dorsalgia, unspecified: Secondary | ICD-10-CM

## 2018-09-19 DIAGNOSIS — G8929 Other chronic pain: Secondary | ICD-10-CM | POA: Insufficient documentation

## 2018-09-19 DIAGNOSIS — M542 Cervicalgia: Secondary | ICD-10-CM

## 2018-09-21 ENCOUNTER — Telehealth: Payer: Self-pay

## 2018-09-21 NOTE — Telephone Encounter (Signed)
Copied from [place] [id number]. Topic: Referral - Status >> Sep 21, 2018 35:46 PM James Padilla wrote:  [id number]/68/1275 Spoke with girlfriend James Padilla (per patient request) about resources for emergency housing and economic services through [place] and Medicaid.[place]

## 2018-09-24 ENCOUNTER — Telehealth: Payer: Self-pay

## 2018-09-24 ENCOUNTER — Ambulatory Visit: Payer: Self-pay

## 2018-09-24 NOTE — Chronic Care Management (AMB) (Signed)
.   Care Management   Note  09/24/2018 Name: TAHEEM FRICKE MRN: [id number] DOB: 07-17-1966   [name] is a 53 year old patient of Raelyn Ensign, FNP who was in the office today to establish care with her for primary care. CCM RN CM received a call from Ms. Boyce requesting assistance from the Chronic Care Management team for emergency housing. CCM RN CM spoke with patient and girlfriend DeeDee via telephone speakerphone. Patients only medical history provided in EMR is ongoing shoulder, neck and back pain that is followed by [facility] neurosurgery. Patient request that DeeDee be contacted for follow up as his cell phone was to be disconnected 09/19/2018.  Per EMR, patient has been given information for housing resources and encouraged to contact his Medicaid worker for further assistance by [employer].   Was unable to reach DeeDee/patient via telephone today for followup. I have left HIPAA compliant voicemail with DeeDee asking patient to return my call. (unsuccessful outreach #1).   Plan: Will follow-up within 7 business days via telephone.       Stefanny Pieri E. Rollene Rotunda, RN, BSN Nurse Care Coordinator [facility] Care Management  [phone]

## 2018-10-02 ENCOUNTER — Ambulatory Visit: Payer: Medicaid Other | Admitting: Family Medicine

## 2018-10-05 ENCOUNTER — Other Ambulatory Visit (HOSPITAL_COMMUNITY)
Admission: RE | Admit: 2018-10-05 | Discharge: 2018-10-05 | Disposition: A | Discharge status: Home / Self Care | Payer: Medicaid Other | Source: Ambulatory Visit | Attending: Family Medicine | Admitting: Family Medicine

## 2018-10-05 ENCOUNTER — Ambulatory Visit: Payer: Medicaid Other | Admitting: Family Medicine

## 2018-10-05 ENCOUNTER — Encounter: Payer: Self-pay | Admitting: Family Medicine

## 2018-10-05 VITALS — BP 132/84 | HR 65 | Temp 97.3°F | Resp 18 | Ht 65.0 in | Wt 130.0 lb

## 2018-10-05 DIAGNOSIS — R454 Irritability and anger: Secondary | ICD-10-CM

## 2018-10-05 DIAGNOSIS — H539 Unspecified visual disturbance: Secondary | ICD-10-CM

## 2018-10-05 DIAGNOSIS — Z5900 Homelessness unspecified: Secondary | ICD-10-CM

## 2018-10-05 DIAGNOSIS — R350 Frequency of micturition: Secondary | ICD-10-CM

## 2018-10-05 DIAGNOSIS — M79601 Pain in right arm: Secondary | ICD-10-CM

## 2018-10-05 DIAGNOSIS — Z23 Encounter for immunization: Secondary | ICD-10-CM

## 2018-10-05 DIAGNOSIS — Z1212 Encounter for screening for malignant neoplasm of rectum: Secondary | ICD-10-CM

## 2018-10-05 DIAGNOSIS — Z113 Encounter for screening for infections with a predominantly sexual mode of transmission: Secondary | ICD-10-CM | POA: Diagnosis not present

## 2018-10-05 DIAGNOSIS — Z1159 Encounter for screening for other viral diseases: Secondary | ICD-10-CM | POA: Diagnosis not present

## 2018-10-05 DIAGNOSIS — Z114 Encounter for screening for human immunodeficiency virus [HIV]: Secondary | ICD-10-CM

## 2018-10-05 DIAGNOSIS — M549 Dorsalgia, unspecified: Secondary | ICD-10-CM | POA: Diagnosis not present

## 2018-10-05 DIAGNOSIS — R51 Headache: Secondary | ICD-10-CM | POA: Diagnosis not present

## 2018-10-05 DIAGNOSIS — M542 Cervicalgia: Secondary | ICD-10-CM

## 2018-10-05 DIAGNOSIS — Z716 Tobacco abuse counseling: Secondary | ICD-10-CM | POA: Insufficient documentation

## 2018-10-05 DIAGNOSIS — R3589 Other polyuria: Secondary | ICD-10-CM

## 2018-10-05 DIAGNOSIS — G8929 Other chronic pain: Secondary | ICD-10-CM

## 2018-10-05 DIAGNOSIS — R519 Headache, unspecified: Secondary | ICD-10-CM

## 2018-10-05 DIAGNOSIS — Z1211 Encounter for screening for malignant neoplasm of colon: Secondary | ICD-10-CM

## 2018-10-05 DIAGNOSIS — Z131 Encounter for screening for diabetes mellitus: Secondary | ICD-10-CM

## 2018-10-05 DIAGNOSIS — Z13220 Encounter for screening for lipoid disorders: Secondary | ICD-10-CM

## 2018-10-05 DIAGNOSIS — F332 Major depressive disorder, recurrent severe without psychotic features: Secondary | ICD-10-CM

## 2018-10-05 DIAGNOSIS — R358 Other polyuria: Secondary | ICD-10-CM

## 2018-10-05 LAB — POCT URINALYSIS DIPSTICK
Bilirubin, UA: NEGATIVE
Glucose, UA: NEGATIVE
KETONES UA: NEGATIVE
Leukocytes, UA: NEGATIVE
NITRITE UA: NEGATIVE
PROTEIN UA: NEGATIVE
RBC UA: NEGATIVE
SPEC GRAV UA: 1.015 (ref 1.010–1.025)
UROBILINOGEN UA: NEGATIVE U/dL — AB
pH, UA: 5 (ref 5.0–8.0)

## 2018-10-05 MED ORDER — DULOXETINE HCL 30 MG PO CPEP: ORAL_CAPSULE | ORAL | 1 refills | Status: DC

## 2018-10-05 MED ORDER — BUSPIRONE HCL 5 MG PO TABS: 5.0000 mg | ORAL_TABLET | Freq: Two times a day (BID) | ORAL | 0 refills | Status: DC | PRN

## 2018-10-05 MED ORDER — DULOXETINE HCL 30 MG PO CPEP: 30.0000 mg | ORAL_CAPSULE | Freq: Every day | ORAL | 1 refills | Status: DC

## 2018-10-05 NOTE — Assessment & Plan Note (Signed)
We will trial cymbalta as this could potentially help his chronic pain that is greatly contributing to his low mood.

## 2018-10-05 NOTE — Progress Notes (Signed)
Name: James Padilla   MRN: [id number]    DOB: 26-Nov-1965   Date:10/05/2018       Progress Note  Subjective  Chief Complaint  Chief Complaint  Patient presents with  . Follow-up    HPI  Homelessness: Sleeping in his car right now.  He was staying at a boarding house, but wasn't able to afford it any longer. At last visit he was referred to [facility] team, they spoke with him directly on the phone at that visit, he then did not return a call from the [facility] team after that visit.  RIGHT shoulder, neck, and upper back pain: Ongoing for many years, but states worsening lately.  He states the pain is unbearable.  Has been taking gabapentin and tramadol without relief of pain.  He is working on obtaining disability. I advised patient of our office policy regarding opiate prescriptions, and that I will not be prescribing any additional pain medications, he verbalizes understanding.  Has been seeing [facility] neurosurgery - he last spoke with Neurosurgery on 09/11/2018 and he was able to make a follow up appointment with them later this week.  He notes that he has no way of transportation to [facility], and would like referral to local location - we will refer to [facility].   Chronic Headaches: He has been getting headaches for many years.  He does note some worsening vision lately - will refer to eye doctor.  Polyuria: He endorses polyphagia, polyuria, and polydipsia. Endorses fatigue - is homeless and sleeping in his car and does not sleep well at night.   IPSS Questionnaire (AUA-7): Over the past month.   1)  How often have you had a sensation of not emptying your bladder completely after you finish urinating?  4 - More than half the time  2)  How often have you had to urinate again less than two hours after you finished urinating? 4 - More than half the time  3)  How often have you found you stopped and started again several times when you urinated?  0 - Not at all  4) How difficult have you found it to postpone  urination?  5 - Almost always  5) How often have you had a weak urinary stream?  3 - About half the time  6) How often have you had to push or strain to begin urination?  3 - About half the time  7) How many times did you most typically get up to urinate from the time you went to bed until the time you got up in the morning?  3 - 3 times  Total score:  0-7 mildly symptomatic   8-19 moderately symptomatic   20-35 severely symptomatic  Score of 22. Mild prostamegaly in 2019 noted on CT scan.  Depression: Very elevated today.  He is under a lot of stress lately, is homeless, dealing with significant chronic pain, and is working on obtaining disability.  Denies SI/HI. He does endorse being easily irritable.  See PHQ-9 for details on symptoms.  We wills tart cymbalta today to help with mood and with symptoms.    Office Visit from 10/05/2018 in [facility]  PHQ-9 Total Score  24      Patient Active Problem List   Diagnosis Date Noted  . Nonintractable episodic headache 10/05/2018  . Vision changes 10/05/2018  . Frequency of urination and polyuria 10/05/2018  . Homelessness 09/19/2018  . Chronic neck pain 09/19/2018  . Chronic  bilateral back pain 09/19/2018  . Right arm pain 08/14/2018    No past surgical history on file.  No family history on file.  Social History   Socioeconomic History  . Marital status: Single    Spouse name: Not on file  . Number of children: Not on file  . Years of education: Not on file  . Highest education level: Not on file  Occupational History  . Occupation: unemployed  Social Needs  . Financial resource strain: Very hard  . Food insecurity:    Worry: Often true    Inability: Often true  . Transportation needs:    Medical: Yes    Non-medical: Yes  Tobacco Use  . Smoking status: Current Every Day Smoker    Packs/day: 0.25    Types: Cigarettes  . Smokeless tobacco: Never Used  Substance and Sexual Activity  . Alcohol use: Yes     Comment: occas.   . Drug use: Yes    Types: Marijuana, Cocaine  . Sexual activity: Yes    Partners: Female  Lifestyle  . Physical activity:    Days per week: 0 days    Minutes per session: 0 min  . Stress: Very much  Relationships  . Social connections:    Talks on phone: Not on file    Gets together: Not on file    Attends religious service: Not on file    Active member of club or organization: Not on file    Attends meetings of clubs or organizations: Not on file    Relationship status: Not on file  . Intimate partner violence:    Fear of current or ex partner: Not on file    Emotionally abused: Not on file    Physically abused: Not on file    Forced sexual activity: Not on file  Other Topics Concern  . Not on file  Social History Narrative  . Not on file     Current Outpatient Medications:  .  busPIRone (BUSPAR) 5 MG tablet, Take 1 tablet (5 mg total) by mouth 2 (two) times daily as needed., Disp: 60 tablet, Rfl: 0 .  DULoxetine (CYMBALTA) 30 MG capsule, Take 1 capsule by mouth once daily x7 days, then increase to one capsule by mouth twice daily, Disp: 180 capsule, Rfl: 1  Allergies  Allergen Reactions  . Septra [Sulfamethoxazole-Trimethoprim]     Renal Failure    I personally reviewed active problem list, medication list, allergies, family history, social history, health maintenance, notes from last encounter, lab results with the patient/caregiver today.   ROS Ten systems reviewed and is negative except as mentioned in HPI.  Objective  Vitals:   10/05/18 1113  BP: 132/84  Pulse: 65  Resp: 18  Temp: (!) 97.3 F (36.3 C)  TempSrc: Oral  SpO2: 98%  Weight: 130 lb (59 kg)  Height: 5\' 5"  (1.651 m)    Body mass index is 21.63 kg/m.  Physical Exam Constitutional: Patient appears well-developed and well-nourished. No distress.  HENT: Head: Normocephalic and atraumatic. Eyes: Conjunctivae and EOM are normal. No scleral icterus. Neck: Normal range  of motion. Neck supple. No JVD present. No thyromegaly present.  Cardiovascular: Normal rate, regular rhythm and normal heart sounds.  No murmur heard. No BLE edema. Pulmonary/Chest: Effort normal and breath sounds normal. No respiratory distress. Musculoskeletal: RIGHT shoulder in sling, limited AROM, back brace on, limited lumbar AROM. Otherwise WNL. Neurological: Pt is alert and oriented to person, place, and time. No cranial nerve  deficit. Coordination, balance, strength, speech and gait are normal.  Skin: Skin is warm and dry. No rash noted. No erythema.  Psychiatric: Patient has a normal mood and affect. behavior is normal. Judgment and thought content normal. Rectal: External hemorrhoids present but non-thrombosed, prostate is slightly enlarged and normal consistency. +Hemoccult.  Results for orders placed or performed in visit on 10/05/18 (from the past 72 hour(s))  POCT urinalysis dipstick     Status: Abnormal   Collection Time: 10/05/18 12:25 PM  Result Value Ref Range   Color, UA yellow    Clarity, UA clear    Glucose, UA Negative Negative   Bilirubin, UA negative    Ketones, UA negative    Spec Grav, UA 1.015 1.010 - 1.025   Blood, UA negative    pH, UA 5.0 5.0 - 8.0   Protein, UA Negative Negative   Urobilinogen, UA negative (A) 0.2 or 1.0 E.U./dL   Nitrite, UA negative    Leukocytes, UA Negative Negative   Appearance clear    Odor none    PHQ2/9: Depression screen [facility] 2/9 10/05/2018 10/05/2018 09/18/2018  Decreased Interest 3 0 0  Down, Depressed, Hopeless 3 0 0  PHQ - 2 Score 6 0 0  Altered sleeping 3 0 0  Tired, decreased energy 3 0 0  Change in appetite 3 0 0  Feeling bad or failure about yourself  3 0 0  Trouble concentrating 3 0 0  Moving slowly or fidgety/restless 3 0 0  Suicidal thoughts - 0 0  PHQ-9 Score 24 0 0  Difficult doing work/chores - Not difficult at all Not difficult at all   Fall Risk: Fall Risk  10/05/2018 09/18/2018  Falls in the past year? 1  0  Number falls in past yr: 1 0  Injury with Fall? 1 0  Risk for fall due to : History of fall(s) -  Follow up Falls evaluation completed Falls evaluation completed   Assessment & Plan  Problem List Items Addressed This Visit      Other   Homelessness    Still living in his car and/or temporarily with friends.      Chronic neck pain - Primary   Relevant Medications   DULoxetine (CYMBALTA) 30 MG capsule   Other Relevant Orders   Ambulatory referral to Spine Surgery   Chronic bilateral back pain   Relevant Medications   DULoxetine (CYMBALTA) 30 MG capsule   Other Relevant Orders   Ambulatory referral to Spine Surgery   Right arm pain   Relevant Medications   DULoxetine (CYMBALTA) 30 MG capsule   Other Relevant Orders   Ambulatory referral to Spine Surgery   Nonintractable episodic headache    Advised to keep headache diary. Try to eat regular meals, and stay well hydrated. Stress reduction discussed in detail.      Relevant Medications   DULoxetine (CYMBALTA) 30 MG capsule   Vision changes    Referred to eye doctor      Relevant Orders   Ambulatory referral to Ophthalmology   Frequency of urination and polyuria    UA negative, will check labs today.      Relevant Orders   PSA   COMPLETE METABOLIC PANEL WITH GFR   Hemoglobin A1c   POCT urinalysis dipstick (Completed)   Diabetes mellitus screening   Relevant Orders   COMPLETE METABOLIC PANEL WITH GFR   Hemoglobin A1c   Severe episode of recurrent major depressive disorder, without psychotic features ([place])  We will trial cymbalta as this could potentially help his chronic pain that is greatly contributing to his low mood.      Relevant Medications   busPIRone (BUSPAR) 5 MG tablet   DULoxetine (CYMBALTA) 30 MG capsule   Irritability and anger    Cymbalta, will also trial buspar to help PRN.      Relevant Medications   busPIRone (BUSPAR) 5 MG tablet   DULoxetine (CYMBALTA) 30 MG capsule    Other Visit  Diagnoses    Lipid screening       Relevant Orders   Lipid panel   Need for hepatitis C screening test       Relevant Orders   Hepatitis C antibody   Encounter for screening for HIV       Relevant Orders   HIV Antibody (routine testing w rflx)   Routine screening for STI (sexually transmitted infection)       Relevant Orders   Hepatitis C antibody   HIV Antibody (routine testing w rflx)   RPR   Urine cytology ancillary only   Screening for colorectal cancer       Relevant Orders   Ambulatory referral to Gastroenterology   Need for Tdap vaccination       Relevant Orders   Tdap vaccine greater than or equal to 7yo IM (Completed)   Needs flu shot       Relevant Orders   Flu Vaccine QUAD 6+ mos PF IM (Fluarix Quad PF) (Completed)

## 2018-10-05 NOTE — Assessment & Plan Note (Signed)
Advised to keep headache diary. Try to eat regular meals, and stay well hydrated. Stress reduction discussed in detail.

## 2018-10-05 NOTE — Assessment & Plan Note (Signed)
UA negative, will check labs today.

## 2018-10-05 NOTE — Assessment & Plan Note (Signed)
Still living in his car and/or temporarily with friends.

## 2018-10-05 NOTE — Assessment & Plan Note (Signed)
Referred to eye doctor

## 2018-10-05 NOTE — Assessment & Plan Note (Signed)
Cymbalta, will also trial buspar to help PRN.

## 2018-10-06 ENCOUNTER — Other Ambulatory Visit: Payer: Self-pay

## 2018-10-06 ENCOUNTER — Ambulatory Visit: Payer: Self-pay

## 2018-10-06 DIAGNOSIS — Z5900 Homelessness unspecified: Secondary | ICD-10-CM

## 2018-10-06 DIAGNOSIS — M542 Cervicalgia: Principal | ICD-10-CM

## 2018-10-06 DIAGNOSIS — G8929 Other chronic pain: Secondary | ICD-10-CM

## 2018-10-06 DIAGNOSIS — Z1211 Encounter for screening for malignant neoplasm of colon: Secondary | ICD-10-CM

## 2018-10-06 LAB — URINE CYTOLOGY ANCILLARY ONLY
Chlamydia: NEGATIVE
Neisseria Gonorrhea: NEGATIVE
Trichomonas: NEGATIVE

## 2018-10-06 LAB — COMPLETE METABOLIC PANEL WITH GFR
AG Ratio: 1.4 (calc) (ref 1.0–2.5)
ALT: 12 U/L (ref 9–46)
AST: 18 U/L (ref 10–35)
Albumin: 4.3 g/dL (ref 3.6–5.1)
Alkaline phosphatase (APISO): 84 U/L (ref 35–144)
BUN/Creatinine Ratio: 8 (calc) (ref 6–22)
BUN: 14 mg/dL (ref 7–25)
CALCIUM: 9.2 mg/dL (ref 8.6–10.3)
CO2: 25 mmol/L (ref 20–32)
Chloride: 107 mmol/L (ref 98–110)
Creat: 1.66 mg/dL — ABNORMAL HIGH (ref 0.70–1.33)
GFR, EST AFRICAN AMERICAN: 53 mL/min/{1.73_m2} — AB (ref >=60)
GFR, Est Non African American: 46 mL/min/{1.73_m2} — ABNORMAL LOW (ref >=60)
GLUCOSE: 80 mg/dL (ref 65–99)
Globulin: 3 g/dL (calc) (ref 1.9–3.7)
Potassium: 4.2 mmol/L (ref 3.5–5.3)
Sodium: 139 mmol/L (ref 135–146)
TOTAL PROTEIN: 7.3 g/dL (ref 6.1–8.1)
Total Bilirubin: 0.9 mg/dL (ref 0.2–1.2)

## 2018-10-06 LAB — HEMOGLOBIN A1C
Hgb A1c MFr Bld: 5.4 %{Hb}
Mean Plasma Glucose: 108 (calc)
eAG (mmol/L): 6 (calc)

## 2018-10-06 LAB — LIPID PANEL
Cholesterol: 113 mg/dL
HDL: 70 mg/dL
LDL Cholesterol (Calc): 28 mg/dL
Non-HDL Cholesterol (Calc): 43 mg/dL
Total CHOL/HDL Ratio: 1.6 (calc)
Triglycerides: 66 mg/dL

## 2018-10-06 LAB — PSA: PSA: 0.7 ng/mL

## 2018-10-06 LAB — RPR: RPR: NONREACTIVE

## 2018-10-06 LAB — HEPATITIS C ANTIBODY
Hepatitis C Ab: NONREACTIVE
SIGNAL TO CUT-OFF: 0.01

## 2018-10-06 LAB — HIV ANTIBODY (ROUTINE TESTING W REFLEX): HIV 1&2 Ab, 4th Generation: NONREACTIVE
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
## 2018-10-09 DIAGNOSIS — M4802 Spinal stenosis, cervical region: Secondary | ICD-10-CM | POA: Diagnosis not present

## 2018-10-12 ENCOUNTER — Other Ambulatory Visit: Payer: Self-pay

## 2018-10-12 ENCOUNTER — Telehealth: Payer: Self-pay | Admitting: Gastroenterology

## 2018-10-12 MED ORDER — NA SULFATE-K SULFATE-MG SULF 17.5-3.13-1.6 GM/177ML PO SOLN: 1.0000 | Freq: Once | ORAL | 0 refills | Status: AC

## 2018-10-12 NOTE — Telephone Encounter (Signed)
Pt left vm he needs his rx for procedure 10/15/18

## 2018-10-12 NOTE — Telephone Encounter (Signed)
Patient has been informed that his rx was attached to his colonoscopy instructions.  He said he hasn't done so.  I've sent his rx to the pharmacy electronically.  Thanks [employer]

## 2018-10-12 NOTE — Telephone Encounter (Signed)
Pt left vm he states he was told to call our office to get his rx filled please call pt

## 2018-10-14 ENCOUNTER — Encounter: Payer: Self-pay | Admitting: Emergency Medicine

## 2018-10-15 ENCOUNTER — Encounter
Admission: RE | Disposition: A | Discharge status: Home / Self Care | Payer: Self-pay | Source: Home / Self Care | Attending: Gastroenterology

## 2018-10-15 ENCOUNTER — Ambulatory Visit
Admission: RE | Admit: 2018-10-15 | Discharge: 2018-10-15 | Disposition: A | Discharge status: Home / Self Care | Payer: Medicaid Other | Attending: Gastroenterology | Admitting: Gastroenterology

## 2018-10-15 ENCOUNTER — Ambulatory Visit: Payer: Medicaid Other | Admitting: Certified Registered"

## 2018-10-15 DIAGNOSIS — Z79899 Other long term (current) drug therapy: Secondary | ICD-10-CM | POA: Diagnosis not present

## 2018-10-15 DIAGNOSIS — K635 Polyp of colon: Secondary | ICD-10-CM | POA: Diagnosis not present

## 2018-10-15 DIAGNOSIS — D122 Benign neoplasm of ascending colon: Secondary | ICD-10-CM | POA: Insufficient documentation

## 2018-10-15 DIAGNOSIS — D126 Benign neoplasm of colon, unspecified: Secondary | ICD-10-CM | POA: Diagnosis not present

## 2018-10-15 DIAGNOSIS — I10 Essential (primary) hypertension: Secondary | ICD-10-CM | POA: Insufficient documentation

## 2018-10-15 DIAGNOSIS — Z1211 Encounter for screening for malignant neoplasm of colon: Secondary | ICD-10-CM | POA: Insufficient documentation

## 2018-10-15 DIAGNOSIS — K5730 Diverticulosis of large intestine without perforation or abscess without bleeding: Secondary | ICD-10-CM | POA: Insufficient documentation

## 2018-10-15 DIAGNOSIS — F17210 Nicotine dependence, cigarettes, uncomplicated: Secondary | ICD-10-CM | POA: Insufficient documentation

## 2018-10-15 DIAGNOSIS — D124 Benign neoplasm of descending colon: Secondary | ICD-10-CM | POA: Diagnosis not present

## 2018-10-15 DIAGNOSIS — K5790 Diverticulosis of intestine, part unspecified, without perforation or abscess without bleeding: Secondary | ICD-10-CM | POA: Diagnosis not present

## 2018-10-15 DIAGNOSIS — F329 Major depressive disorder, single episode, unspecified: Secondary | ICD-10-CM | POA: Insufficient documentation

## 2018-10-15 HISTORY — PX: COLONOSCOPY WITH PROPOFOL: SHX5780

## 2018-10-15 SURGERY — COLONOSCOPY WITH PROPOFOL
Anesthesia: General

## 2018-10-15 MED ORDER — SODIUM CHLORIDE 0.9 % IV SOLN
INTRAVENOUS | Status: DC
  Administered 2018-10-15: 1000 mL via INTRAVENOUS

## 2018-10-15 MED ORDER — LIDOCAINE HCL (CARDIAC) PF 100 MG/5ML IV SOSY
PREFILLED_SYRINGE | INTRAVENOUS | Status: DC | PRN
  Administered 2018-10-15: 80 mg via INTRAVENOUS

## 2018-10-15 MED ORDER — FENTANYL CITRATE (PF) 100 MCG/2ML IJ SOLN
INTRAMUSCULAR | Status: AC
  Filled 2018-10-15: qty 2

## 2018-10-15 MED ORDER — PHENYLEPHRINE HCL 10 MG/ML IJ SOLN
INTRAMUSCULAR | Status: DC | PRN
  Administered 2018-10-15: 200 ug via INTRAVENOUS

## 2018-10-15 MED ORDER — PROPOFOL 10 MG/ML IV BOLUS
INTRAVENOUS | Status: AC
  Filled 2018-10-15: qty 40

## 2018-10-15 MED ORDER — PROPOFOL 500 MG/50ML IV EMUL
INTRAVENOUS | Status: DC | PRN
  Administered 2018-10-15: 150 ug/kg/min via INTRAVENOUS

## 2018-10-15 MED ORDER — PROPOFOL 10 MG/ML IV BOLUS
INTRAVENOUS | Status: DC | PRN
  Administered 2018-10-15: 100 mg via INTRAVENOUS

## 2018-10-15 NOTE — H&P (Signed)
James Bellows, MD [address] [place], [place], [place], [address] [address], [place], [place], [place], [address] Phone: [phone]  Fax: [phone]  Primary Care Physician:  [name], FNP   Pre-Procedure History & Physical: HPI:  James MAU is a 53 y.o. male is here for an colonoscopy.   Past Medical History:  Diagnosis Date  . Hypertension     History reviewed. No pertinent surgical history.  Prior to Admission medications   Medication Sig Start Date End Date Taking? Authorizing Provider  busPIRone (BUSPAR) 5 MG tablet Take 1 tablet (5 mg total) by mouth 2 (two) times daily as needed. 10/05/18  Yes [name], FNP  DULoxetine (CYMBALTA) 30 MG capsule Take 1 capsule by mouth once daily x7 days, then increase to one capsule by mouth twice daily 10/05/18  Yes [name], FNP    Allergies as of 10/06/2018 - Review Complete 10/05/2018  Allergen Reaction Noted  . Septra [sulfamethoxazole-trimethoprim]  10/05/2018    History reviewed. No pertinent family history.  Social History   Socioeconomic History  . Marital status: Single    Spouse name: Not on file  . Number of children: Not on file  . Years of education: Not on file  . Highest education level: Not on file  Occupational History  . Occupation: unemployed  Social Needs  . Financial resource strain: Very hard  . Food insecurity:    Worry: Often true    Inability: Often true  . Transportation needs:    Medical: Yes    Non-medical: Yes  Tobacco Use  . Smoking status: Current Every Day Smoker    Packs/day: 0.25    Types: Cigarettes  . Smokeless tobacco: Never Used  Substance and Sexual Activity  . Alcohol use: Yes    Comment: occas.   . Drug use: Yes    Types: Marijuana, Cocaine  . Sexual activity: Yes    Partners: Female  Lifestyle  . Physical activity:    Days per week: 0 days    Minutes per session: 0 min  . Stress: Very much  Relationships  . Social connections:   Talks on phone: Not on file    Gets together: Not on file    Attends religious service: Not on file    Active member of club or organization: Not on file    Attends meetings of clubs or organizations: Not on file    Relationship status: Not on file  . Intimate partner violence:    Fear of current or ex partner: Not on file    Emotionally abused: Not on file    Physically abused: Not on file    Forced sexual activity: Not on file  Other Topics Concern  . Not on file  Social History Narrative  . Not on file    Review of Systems: See HPI, otherwise negative ROS  Physical Exam: BP 129/81   Pulse (!) 55   Temp 97.7 F (36.5 C) (Tympanic)   Resp 20   Wt 55.3 kg   SpO2 100%   BMI 20.30 kg/m  General:   Alert,  pleasant and cooperative in NAD Head:  Normocephalic and atraumatic. Neck:  Supple; no masses or thyromegaly. Lungs:  Clear throughout to auscultation, normal respiratory effort.    Heart:  +S1, +S2, Regular rate and rhythm, No edema. Abdomen:  Soft, nontender and nondistended. Normal bowel sounds, without guarding, and without rebound.   Neurologic:  Alert and  oriented x4;  grossly normal neurologically.  Impression/Plan: James Padilla is here for an colonoscopy to be performed for Screening colonoscopy average risk   Risks, benefits, limitations, and alternatives regarding  colonoscopy have been reviewed with the patient.  Questions have been answered.  All parties agreeable.   James Bellows, MD  10/15/2018, 8:24 AM

## 2018-10-15 NOTE — Anesthesia Post-op Follow-up Note (Signed)
Anesthesia QCDR form completed.        

## 2018-10-15 NOTE — Transfer of Care (Signed)
Immediate Anesthesia Transfer of Care Note  Patient: James Padilla  Procedure(s) Performed: COLONOSCOPY WITH PROPOFOL (N/A )  Patient Location: PACU  Anesthesia Type:General  Level of Consciousness: awake and sedated  Airway & Oxygen Therapy: Patient Spontanous Breathing  Post-op Assessment: Report given to RN and Post -op Vital signs reviewed and stable  Post vital signs: Reviewed and stable  Last Vitals:  Vitals Value Taken Time  BP    Temp    Pulse    Resp    SpO2      Last Pain:  Vitals:   10/15/18 0803  TempSrc: Tympanic  PainSc: 0-No pain         Complications: No apparent anesthesia complications

## 2018-10-15 NOTE — Op Note (Signed)
[facility] Gastroenterology Patient Name: James Padilla Procedure Date: 10/15/2018 8:28 AM MRN: [id number] Account #: [id number] Date of Birth: [birth year] Admit Type: Outpatient Age: 54 Room: [facility] ENDO ROOM 3 Gender: Male Note Status: Finalized Procedure:            Colonoscopy Indications:          Screening for colorectal malignant neoplasm Providers:            Jonathon Bellows MD, MD Referring MD:         [place] (Referring MD) Medicines:            Monitored Anesthesia Care Complications:        No immediate complications. Procedure:            Pre-Anesthesia Assessment:                       - Prior to the procedure, a History and Physical was                        performed, and patient medications, allergies and                        sensitivities were reviewed. The patient's tolerance of                        previous anesthesia was reviewed.                       - The risks and benefits of the procedure and the                        sedation options and risks were discussed with the                        patient. All questions were answered and informed                        consent was obtained.                       - ASA Grade Assessment: II - A patient with mild                        systemic disease.                       After obtaining informed consent, the colonoscope was                        passed under direct vision. Throughout the procedure,                        the patient's blood pressure, pulse, and oxygen                        saturations were monitored continuously. The                        Colonoscope was introduced through the anus and  advanced to the the cecum, identified by the                        appendiceal orifice, IC valve and transillumination.                        The colonoscopy was performed with ease. The patient                        tolerated the procedure well. The  quality of the bowel                        preparation was good. Findings:      The perianal and digital rectal examinations were normal.      Two sessile polyps were found in the descending colon and ascending       colon. The polyps were 3 to 4 mm in size. These polyps were removed with       a cold biopsy forceps. Resection and retrieval were complete.      Multiple small-mouthed diverticula were found in the sigmoid colon.      The exam was otherwise without abnormality on direct and retroflexion       views. Impression:           - Two 3 to 4 mm polyps in the descending colon and in                        the ascending colon, removed with a cold biopsy                        forceps. Resected and retrieved.                       - Diverticulosis in the sigmoid colon.                       - The examination was otherwise normal on direct and                        retroflexion views. Recommendation:       - Discharge patient to home (with escort).                       - Resume previous diet.                       - Continue present medications.                       - Await pathology results.                       - Repeat colonoscopy for surveillance. Procedure Code(s):    --- Professional ---                       [phone], Colonoscopy, flexible; with biopsy, single or                        multiple Diagnosis Code(s):    --- Professional ---  Z12.11, Encounter for screening for malignant neoplasm                        of colon                       D12.4, Benign neoplasm of descending colon                       D12.2, Benign neoplasm of ascending colon                       K57.30, Diverticulosis of large intestine without                        perforation or abscess without bleeding CPT copyright 2018 American Medical Association. All rights reserved. The codes documented in this report are preliminary and upon coder review may  be revised to meet  current compliance requirements. Jonathon Bellows, MD Jonathon Bellows MD, MD 10/15/2018 8:58:02 AM This report has been signed electronically. Number of Addenda: 0 Note Initiated On: 10/15/2018 8:28 AM Scope Withdrawal Time: 0 hours 13 minutes 54 seconds  Total Procedure Duration: 0 hours 18 minutes 50 seconds       [facility]

## 2018-10-15 NOTE — Anesthesia Preprocedure Evaluation (Signed)
Anesthesia Evaluation  Patient identified by MRN, date of birth, ID band Patient awake    Reviewed: Allergy & Precautions, H&P , NPO status , Patient's Chart, lab work & pertinent test results, reviewed documented beta blocker date and time   Airway Mallampati: II   Neck ROM: full    Dental  (+) Poor Dentition   Pulmonary neg pulmonary ROS, Current Smoker,    Pulmonary exam normal        Cardiovascular Exercise Tolerance: Good hypertension, On Medications negative cardio ROS Normal cardiovascular exam Rhythm:regular Rate:Normal     Neuro/Psych  Headaches, PSYCHIATRIC DISORDERS Depression    GI/Hepatic negative GI ROS, Neg liver ROS,   Endo/Other  negative endocrine ROS  Renal/GU negative Renal ROS  negative genitourinary   Musculoskeletal   Abdominal   Peds  Hematology negative hematology ROS (+)   Anesthesia Other Findings Past Medical History: No date: Hypertension History reviewed. No pertinent surgical history. BMI    Body Mass Index:  20.30 kg/m     Reproductive/Obstetrics negative OB ROS                             Anesthesia Physical Anesthesia Plan  ASA: II  Anesthesia Plan: General   Post-op Pain Management:    Induction:   PONV Risk Score and Plan:   Airway Management Planned:   Additional Equipment:   Intra-op Plan:   Post-operative Plan:   Informed Consent: I have reviewed the patients History and Physical, chart, labs and discussed the procedure including the risks, benefits and alternatives for the proposed anesthesia with the patient or authorized representative who has indicated his/her understanding and acceptance.     Dental Advisory Given  Plan Discussed with: CRNA  Anesthesia Plan Comments:         Anesthesia Quick Evaluation

## 2018-10-16 ENCOUNTER — Encounter: Payer: Self-pay | Admitting: Gastroenterology

## 2018-10-16 LAB — SURGICAL PATHOLOGY

## 2018-10-16 NOTE — Anesthesia Postprocedure Evaluation (Signed)
Anesthesia Post Note  Patient: James Padilla  Procedure(s) Performed: COLONOSCOPY WITH PROPOFOL (N/A )  Patient location during evaluation: PACU Anesthesia Type: General Level of consciousness: awake and alert Pain management: pain level controlled Vital Signs Assessment: post-procedure vital signs reviewed and stable Respiratory status: spontaneous breathing, nonlabored ventilation, respiratory function stable and patient connected to nasal cannula oxygen Cardiovascular status: blood pressure returned to baseline and stable Postop Assessment: no apparent nausea or vomiting Anesthetic complications: no     Last Vitals:  Vitals:   10/15/18 0917 10/15/18 0927  BP: 106/74 (!) 144/95  Pulse: (!) 54 (!) 59  Resp: 12 14  Temp:    SpO2: 100% 96%    Last Pain:  Vitals:   10/15/18 0927  TempSrc:   PainSc: 0-No pain                 Molli Barrows

## 2018-10-18 ENCOUNTER — Encounter: Payer: Self-pay | Admitting: Gastroenterology

## 2018-10-28 DIAGNOSIS — G959 Disease of spinal cord, unspecified: Secondary | ICD-10-CM | POA: Diagnosis not present

## 2018-11-02 DIAGNOSIS — Z0100 Encounter for examination of eyes and vision without abnormal findings: Secondary | ICD-10-CM | POA: Diagnosis not present

## 2018-11-02 DIAGNOSIS — H5213 Myopia, bilateral: Secondary | ICD-10-CM | POA: Diagnosis not present

## 2018-11-06 ENCOUNTER — Inpatient Hospital Stay: Admission: RE | Admit: 2018-11-06 | Payer: Medicaid Other | Source: Ambulatory Visit

## 2018-11-10 ENCOUNTER — Other Ambulatory Visit: Payer: Self-pay

## 2018-11-10 ENCOUNTER — Encounter: Payer: Self-pay | Admitting: Anesthesiology

## 2018-11-10 ENCOUNTER — Encounter
Admission: RE | Admit: 2018-11-10 | Discharge: 2018-11-10 | Disposition: A | Discharge status: Home / Self Care | Payer: Medicaid Other | Source: Ambulatory Visit | Attending: Neurosurgery | Admitting: Neurosurgery

## 2018-11-10 ENCOUNTER — Encounter: Payer: Self-pay | Admitting: *Deleted

## 2018-11-10 DIAGNOSIS — Z01818 Encounter for other preprocedural examination: Secondary | ICD-10-CM | POA: Diagnosis not present

## 2018-11-10 DIAGNOSIS — R9431 Abnormal electrocardiogram [ECG] [EKG]: Secondary | ICD-10-CM | POA: Diagnosis not present

## 2018-11-10 LAB — SURGICAL PCR SCREEN
MRSA, PCR: NEGATIVE
STAPHYLOCOCCUS AUREUS: NEGATIVE

## 2018-11-10 LAB — CBC
HEMATOCRIT: 37.4 % — AB (ref 39.0–52.0)
Hemoglobin: 12.7 g/dL — ABNORMAL LOW (ref 13.0–17.0)
MCH: 33.6 pg (ref 26.0–34.0)
MCHC: 34 g/dL (ref 30.0–36.0)
MCV: 98.9 fL (ref 80.0–100.0)
Platelets: 202 10*3/uL (ref 150–400)
RBC: 3.78 MIL/uL — ABNORMAL LOW (ref 4.22–5.81)
RDW: 12.5 % (ref 11.5–15.5)
WBC: 4.8 10*3/uL (ref 4.0–10.5)
nRBC: 0 % (ref 0.0–0.2)

## 2018-11-10 LAB — BASIC METABOLIC PANEL
Anion gap: 6 (ref 5–15)
BUN: 15 mg/dL (ref 6–20)
CO2: 23 mmol/L (ref 22–32)
Calcium: 8.6 mg/dL — ABNORMAL LOW (ref 8.9–10.3)
Chloride: 109 mmol/L (ref 98–111)
Creatinine, Ser: 1.41 mg/dL — ABNORMAL HIGH (ref 0.61–1.24)
GFR calc non Af Amer: 56 mL/min — ABNORMAL LOW (ref >60)
Glucose, Bld: 93 mg/dL (ref 70–99)
Potassium: 4 mmol/L (ref 3.5–5.1)
Sodium: 138 mmol/L (ref 135–145)

## 2018-11-10 LAB — PROTIME-INR
INR: 0.9 (ref 0.8–1.2)
Prothrombin Time: 12.1 seconds (ref 11.4–15.2)

## 2018-11-10 LAB — URINALYSIS, COMPLETE (UACMP) WITH MICROSCOPIC
Bacteria, UA: NONE SEEN
Bilirubin Urine: NEGATIVE
Glucose, UA: NEGATIVE mg/dL
Hgb urine dipstick: NEGATIVE
Ketones, ur: NEGATIVE mg/dL
Leukocytes,Ua: NEGATIVE
Nitrite: NEGATIVE
Protein, ur: NEGATIVE mg/dL
SPECIFIC GRAVITY, URINE: 1.005 (ref 1.005–1.030)
Squamous Epithelial / HPF: NONE SEEN (ref 0–5)
pH: 6 (ref 5.0–8.0)

## 2018-11-10 LAB — TYPE AND SCREEN
ABO/RH(D): O POS
Antibody Screen: NEGATIVE

## 2018-11-10 LAB — APTT: APTT: 31 s (ref 24–36)

## 2018-11-10 NOTE — Pre-Procedure Instructions (Signed)
States "unable to afford BP medication".

## 2018-11-10 NOTE — Patient Instructions (Signed)
Your procedure is scheduled on: 11/18/2018 Wed Report to Same Day Surgery 2nd floor medical mall [facility] Entrance-take elevator on left to 2nd floor.  Check in with surgery information desk.) To find out your arrival time please call [phone] between 1PM - 3PM on 11/17/2018 Tues  Remember: Instructions that are not followed completely may result in serious medical risk, up to and including death, or upon the discretion of your surgeon and anesthesiologist your surgery may need to be rescheduled.    _x___ 1. Do not eat food after midnight the night before your procedure. You may drink clear liquids up to 2 hours before you are scheduled to arrive at the hospital for your procedure.  Do not drink clear liquids within 2 hours of your scheduled arrival to the hospital.  Clear liquids include  --Water or Apple juice without pulp  --Clear carbohydrate beverage such as ClearFast or Gatorade  --Black Coffee or Clear Tea (No milk, no creamers, do not add anything to                  the coffee or Tea Type 1 and type 2 diabetics should only drink water.   ____Ensure clear carbohydrate drink on the way to the hospital for bariatric patients  ____Ensure clear carbohydrate drink 3 hours before surgery for Dr Dwyane Luo patients if physician instructed.   No gum chewing or hard candies.     __x__ 2. No Alcohol for 24 hours before or after surgery.   __x__3. No Smoking or e-cigarettes for 24 prior to surgery.  Do not use any chewable tobacco products for at least 6 hour prior to surgery   ____  4. Bring all medications with you on the day of surgery if instructed.    __x__ 5. Notify your doctor if there is any change in your medical condition     (cold, fever, infections).    x___6. On the morning of surgery brush your teeth with toothpaste and water.  You may rinse your mouth with mouth wash if you wish.  Do not swallow any toothpaste or mouthwash.   Do not wear jewelry, make-up,  hairpins, clips or nail polish.  Do not wear lotions, powders, or perfumes. You may wear deodorant.  Do not shave 48 hours prior to surgery. Men may shave face and neck.  Do not bring valuables to the hospital.    [facility] is not responsible for any belongings or valuables.               Contacts, dentures or bridgework may not be worn into surgery.  Leave your suitcase in the car. After surgery it may be brought to your room.  For patients admitted to the hospital, discharge time is determined by your                       treatment team.  _  Patients discharged the day of surgery will not be allowed to drive home.  You will need someone to drive you home and stay with you the night of your procedure.    Please read over the following fact sheets that you were given:   [facility] Preparing for Surgery and or MRSA Information   _x___ Take anti-hypertensive listed below, cardiac, seizure, asthma,     anti-reflux and psychiatric medicines. These include:  1. busPIRone (BUSPAR) 5 MG tablet  2.DULoxetine (CYMBALTA) 30 MG capsule  3.  4.  5.  6.  ____Fleets enema or Magnesium Citrate as directed.   _x___ Use CHG Soap or sage wipes as directed on instruction sheet   ____ Use inhalers on the day of surgery and bring to hospital day of surgery  ____ Stop Metformin and Janumet 2 days prior to surgery.    ____ Take 1/2 of usual insulin dose the night before surgery and none on the morning     surgery.   _x___ Follow recommendations from Cardiologist, Pulmonologist or PCP regarding          stopping Aspirin, Coumadin, Plavix ,Eliquis, Effient, or Pradaxa, and Pletal.  X____Stop Anti-inflammatories such as Advil, Aleve, Ibuprofen, Motrin, Naproxen, Naprosyn, Goodies powders or aspirin products. OK to take Tylenol and                          Celebrex.   _x___ Stop supplements until after surgery.  But may continue Vitamin D, Vitamin B,       and multivitamin.   ____ Bring C-Pap to  the hospital.

## 2018-11-10 NOTE — Pre-Procedure Instructions (Signed)
EKG REVIEWED AND OK BY DR Rosey Bath

## 2018-11-18 ENCOUNTER — Encounter
Admission: RE | Disposition: A | Discharge status: Home / Self Care | Payer: Self-pay | Source: Ambulatory Visit | Attending: Neurosurgery

## 2018-11-18 ENCOUNTER — Other Ambulatory Visit: Payer: Self-pay

## 2018-11-18 ENCOUNTER — Other Ambulatory Visit: Payer: Self-pay | Admitting: Emergency Medicine

## 2018-11-18 ENCOUNTER — Ambulatory Visit
Admission: RE | Admit: 2018-11-18 | Discharge: 2018-11-18 | Disposition: A | Discharge status: Home / Self Care | Payer: Medicaid Other | Source: Ambulatory Visit | Attending: Neurosurgery | Admitting: Neurosurgery

## 2018-11-18 ENCOUNTER — Encounter: Payer: Self-pay | Admitting: *Deleted

## 2018-11-18 DIAGNOSIS — G959 Disease of spinal cord, unspecified: Secondary | ICD-10-CM | POA: Insufficient documentation

## 2018-11-18 DIAGNOSIS — Z5309 Procedure and treatment not carried out because of other contraindication: Secondary | ICD-10-CM | POA: Insufficient documentation

## 2018-11-18 HISTORY — DX: Major depressive disorder, single episode, unspecified: F32.9

## 2018-11-18 HISTORY — DX: Depression, unspecified: F32.A

## 2018-11-18 HISTORY — DX: Other symptoms and signs involving the musculoskeletal system: R29.898

## 2018-11-18 HISTORY — DX: Anxiety disorder, unspecified: F41.9

## 2018-11-18 HISTORY — DX: Dyspnea, unspecified: R06.00

## 2018-11-18 HISTORY — DX: Cervicalgia: M54.2

## 2018-11-18 LAB — URINE DRUG SCREEN, QUALITATIVE (ARMC ONLY)
Amphetamines, Ur Screen: NOT DETECTED
BARBITURATES, UR SCREEN: NOT DETECTED
Benzodiazepine, Ur Scrn: NOT DETECTED
Cannabinoid 50 Ng, Ur ~~LOC~~: NOT DETECTED
Cocaine Metabolite,Ur ~~LOC~~: POSITIVE — AB
MDMA (Ecstasy)Ur Screen: NOT DETECTED
Methadone Scn, Ur: NOT DETECTED
Opiate, Ur Screen: NOT DETECTED
Phencyclidine (PCP) Ur S: NOT DETECTED
Tricyclic, Ur Screen: NOT DETECTED

## 2018-11-18 LAB — ABO/RH: ABO/RH(D): O POS

## 2018-11-18 SURGERY — ANTERIOR CERVICAL DECOMPRESSION/DISCECTOMY FUSION 3 LEVELS
Anesthesia: General

## 2018-11-18 MED ORDER — LACTATED RINGERS IV SOLN: INTRAVENOUS | Status: DC

## 2018-11-18 MED ORDER — FAMOTIDINE 20 MG PO TABS
20.0000 mg | ORAL_TABLET | Freq: Once | ORAL | Status: AC
  Administered 2018-11-18: 20 mg via ORAL

## 2018-11-18 MED ORDER — CEFAZOLIN SODIUM-DEXTROSE 1-4 GM/50ML-% IV SOLN: 1.0000 g | Freq: Once | INTRAVENOUS | Status: DC

## 2018-11-18 MED ORDER — FAMOTIDINE 20 MG PO TABS
ORAL_TABLET | ORAL | Status: AC
  Filled 2018-11-18: qty 1

## 2018-11-18 SURGICAL SUPPLY — 61 items
BAND RUBBER 3X1/6 TAN STRL (MISCELLANEOUS) ×3 IMPLANT
BLADE BOVIE TIP EXT 4 (BLADE) ×3 IMPLANT
BLADE SURG 15 STRL LF DISP TIS (BLADE) ×1 IMPLANT
BLADE SURG 15 STRL SS (BLADE) ×2
BULB RESERV EVAC DRAIN JP 100C (MISCELLANEOUS) IMPLANT
BUR NEURO DRILL SOFT 3.0X3.8M (BURR) ×3 IMPLANT
CANISTER SUCT 1200ML W/VALVE (MISCELLANEOUS) ×6 IMPLANT
CHLORAPREP W/TINT 26 (MISCELLANEOUS) ×3 IMPLANT
CLOSURE WOUND 1/2 X4 (GAUZE/BANDAGES/DRESSINGS)
COUNTER NEEDLE 20/40 LG (NEEDLE) ×3 IMPLANT
COVER LIGHT HANDLE STERIS (MISCELLANEOUS) ×6 IMPLANT
COVER WAND RF STERILE (DRAPES) ×3 IMPLANT
CRADLE LAMINECT ARM (MISCELLANEOUS) ×3 IMPLANT
CUP MEDICINE 2OZ PLAST GRAD ST (MISCELLANEOUS) ×6 IMPLANT
DERMABOND ADVANCED (GAUZE/BANDAGES/DRESSINGS) ×2
DERMABOND ADVANCED .7 DNX12 (GAUZE/BANDAGES/DRESSINGS) ×1 IMPLANT
DRAIN CHANNEL JP 10F RND 20C F (MISCELLANEOUS) IMPLANT
DRAPE C-ARM 42X72 X-RAY (DRAPES) ×6 IMPLANT
DRAPE INCISE IOBAN 66X45 STRL (DRAPES) ×3 IMPLANT
DRAPE LAPAROTOMY 77X122 PED (DRAPES) ×3 IMPLANT
DRAPE MICROSCOPE SPINE 48X150 (DRAPES) ×3 IMPLANT
DRAPE POUCH INSTRU U-SHP 10X18 (DRAPES) ×3 IMPLANT
DRAPE SHEET LG 3/4 BI-LAMINATE (DRAPES) ×3 IMPLANT
DRAPE SURG 17X11 SM STRL (DRAPES) ×12 IMPLANT
DRAPE TABLE BACK 80X90 (DRAPES) ×3 IMPLANT
ELECT CAUTERY BLADE TIP 2.5 (TIP) ×3
ELECTRODE CAUTERY BLDE TIP 2.5 (TIP) ×1 IMPLANT
FEE INTRAOP MONITOR IMPULS NCS (MISCELLANEOUS) IMPLANT
FRAME EYE SHIELD (PROTECTIVE WEAR) ×3 IMPLANT
GAUZE SPONGE 4X4 12PLY STRL (GAUZE/BANDAGES/DRESSINGS) ×3 IMPLANT
GLOVE SURG SYN 7.0 (GLOVE) ×6 IMPLANT
GLOVE SURG SYN 8.5  E (GLOVE) ×6
GLOVE SURG SYN 8.5 E (GLOVE) ×3 IMPLANT
GOWN SRG XL LVL 3 NONREINFORCE (GOWNS) ×1 IMPLANT
GOWN STRL NON-REIN TWL XL LVL3 (GOWNS) ×2
GOWN STRL REUS W/ TWL LRG LVL3 (GOWN DISPOSABLE) ×1 IMPLANT
GOWN STRL REUS W/TWL LRG LVL3 (GOWN DISPOSABLE) ×2
GRADUATE 1200CC STRL 31836 (MISCELLANEOUS) ×3 IMPLANT
INTRAOP MONITOR FEE IMPULS NCS (MISCELLANEOUS)
INTRAOP MONITOR FEE IMPULSE (MISCELLANEOUS)
IV CATH ANGIO 12GX3 LT BLUE (NEEDLE) ×3 IMPLANT
KIT TURNOVER KIT A (KITS) ×3 IMPLANT
MARKER SKIN DUAL TIP RULER LAB (MISCELLANEOUS) ×6 IMPLANT
NEEDLE HYPO 22GX1.5 SAFETY (NEEDLE) ×3 IMPLANT
NS IRRIG 1000ML POUR BTL (IV SOLUTION) ×3 IMPLANT
PACK LAMINECTOMY NEURO (CUSTOM PROCEDURE TRAY) ×3 IMPLANT
PIN CASPAR 14 (PIN) ×1 IMPLANT
PIN CASPAR 14MM (PIN) ×3
SPOGE SURGIFLO 8M (HEMOSTASIS) ×2
SPONGE KITTNER 5P (MISCELLANEOUS) ×6 IMPLANT
SPONGE SURGIFLO 8M (HEMOSTASIS) ×1 IMPLANT
STAPLER SKIN PROX 35W (STAPLE) IMPLANT
STRIP CLOSURE SKIN 1/2X4 (GAUZE/BANDAGES/DRESSINGS) IMPLANT
SUT V-LOC 90 ABS DVC 3-0 CL (SUTURE) ×3 IMPLANT
SUT VIC AB 3-0 SH 8-18 (SUTURE) ×3 IMPLANT
SYR 30ML LL (SYRINGE) ×3 IMPLANT
TAPE CLOTH 3X10 WHT NS LF (GAUZE/BANDAGES/DRESSINGS) ×3 IMPLANT
TOWEL OR 17X26 4PK STRL BLUE (TOWEL DISPOSABLE) ×12 IMPLANT
TRAY FOLEY MTR SLVR 16FR STAT (SET/KITS/TRAYS/PACK) IMPLANT
TUBING CONNECTING 10 (TUBING) ×2 IMPLANT
TUBING CONNECTING 10' (TUBING) ×1

## 2018-11-18 NOTE — OR Nursing (Signed)
Urine drug screen positive for cocaine. Case cancelled by Dr. Ronelle Nigh. Dr. Cari Caraway spoke to patient.

## 2018-12-04 ENCOUNTER — Encounter: Payer: Medicaid Other | Admitting: Family Medicine

## 2018-12-16 DIAGNOSIS — H524 Presbyopia: Secondary | ICD-10-CM | POA: Diagnosis not present

## 2018-12-17 ENCOUNTER — Encounter: Payer: Self-pay | Admitting: Surgery

## 2018-12-17 ENCOUNTER — Other Ambulatory Visit: Payer: Self-pay

## 2018-12-17 ENCOUNTER — Ambulatory Visit (INDEPENDENT_AMBULATORY_CARE_PROVIDER_SITE_OTHER): Payer: Medicaid Other | Admitting: Surgery

## 2018-12-17 ENCOUNTER — Ambulatory Visit (INDEPENDENT_AMBULATORY_CARE_PROVIDER_SITE_OTHER): Payer: Medicaid Other | Admitting: Family Medicine

## 2018-12-17 ENCOUNTER — Encounter: Payer: Self-pay | Admitting: Family Medicine

## 2018-12-17 VITALS — BP 112/72 | HR 68 | Temp 97.7°F | Ht 66.0 in | Wt 129.2 lb

## 2018-12-17 DIAGNOSIS — L723 Sebaceous cyst: Secondary | ICD-10-CM | POA: Diagnosis not present

## 2018-12-17 DIAGNOSIS — L0291 Cutaneous abscess, unspecified: Secondary | ICD-10-CM | POA: Diagnosis not present

## 2018-12-17 DIAGNOSIS — Z5900 Homelessness unspecified: Secondary | ICD-10-CM

## 2018-12-17 DIAGNOSIS — F1490 Cocaine use, unspecified, uncomplicated: Secondary | ICD-10-CM | POA: Insufficient documentation

## 2018-12-17 HISTORY — DX: Cutaneous abscess, unspecified: L02.91

## 2018-12-17 NOTE — Progress Notes (Addendum)
Name: James Padilla   MRN: [id number]    DOB: Jun 01, 1966   Date:12/17/2018       Progress Note  Subjective  Chief Complaint  No chief complaint on file.  I connected with  James Padilla on 12/17/18 at 10:00 AM EDT by telephone and verified that I am speaking with the correct person using two identifiers.   I discussed the limitations, risks, security and privacy concerns of performing an evaluation and management service by telephone and the availability of in person appointments. Staff also discussed with the patient that there may be a patient responsible charge related to this service. Patient Location: Home Provider Location: Home Additional Individuals present: None  HPI  Pt presents with concern for "boil" on the top left of his back over his shoulder blade.  He reports history of similar abscess in February 2019 for which he had multiple evaluation in the [facility] ER for.  This area has not completely healed since then, and is now larger and painful again.  No fevers/chills, extremity weakness or neck pain/stiffness. He does have transportation to see surgeon if able to be seen today.  He relapsed and started using drugs again - went back to smoking crack about a month ago when Dr. Izora Ribas had to reschedule his back surgery due to [place] pandemic.  Last use was 12/10/2018.   He remains homeless and is very stressed at this time - sleeping at his cousins house, and states does have food stamps to use.  Patient Active Problem List   Diagnosis Date Noted   Nonintractable episodic headache 10/05/2018   Vision changes 10/05/2018   Frequency of urination and polyuria 10/05/2018   Diabetes mellitus screening 10/05/2018   Severe episode of recurrent major depressive disorder, without psychotic features (Organ) 10/05/2018   Irritability and anger 10/05/2018   Homelessness 09/19/2018   Chronic neck pain 09/19/2018   Chronic bilateral back pain 09/19/2018   Right arm pain  08/14/2018    Social History   Tobacco Use   Smoking status: Current Every Day Smoker    Packs/day: 0.25    Types: Cigarettes   Smokeless tobacco: Never Used  Substance Use Topics   Alcohol use: Not Currently    Comment: occas.      Current Outpatient Medications:    busPIRone (BUSPAR) 5 MG tablet, Take 1 tablet (5 mg total) by mouth 2 (two) times daily as needed. (Patient taking differently: Take 5 mg by mouth daily. ), Disp: 60 tablet, Rfl: 0   DULoxetine (CYMBALTA) 30 MG capsule, Take 1 capsule by mouth once daily x7 days, then increase to one capsule by mouth twice daily (Patient taking differently: Take 30 mg by mouth daily. ), Disp: 180 capsule, Rfl: 1   hydrochlorothiazide (HYDRODIURIL) 12.5 MG tablet, Take by mouth., Disp: , Rfl:   Allergies  Allergen Reactions   Septra [Sulfamethoxazole-Trimethoprim] Other (See Comments)    Renal Failure   Lactose Nausea And Vomiting    I personally reviewed active problem list, medication list, allergies, notes from last encounter, lab results with the patient/caregiver today.  ROS  Ten systems reviewed and is negative except as mentioned in HPI.  Objective  Virtual encounter, vitals not obtained.  There is no height or weight on file to calculate BMI.  Nursing Note and Vital Signs reviewed.  Physical Exam  Pt does not have access to video for evaluation, and therefore my evaluation is quite limited, and patient has been a somewhat unreliable historian  in the past.  Constitutional: Patient appears well-developed and well-nourished. No distress.  Neck: Normal range of motion per patient verbal.  Pulmonary/Chest: Effort normal. No respiratory distress. Speaking in complete sentences Neurological: Pt is alert and oriented to person, place, and time. Speech is normal.  Psychiatric: Patient has a normal mood and affect. behavior is normal. Judgment and thought content normal.  Depression screen [facility] 2/9 12/17/2018  10/05/2018 10/05/2018 09/18/2018  Decreased Interest 1 3 0 0  Down, Depressed, Hopeless 2 3 0 0  PHQ - 2 Score 3 6 0 0  Altered sleeping 2 3 0 0  Tired, decreased energy 2 3 0 0  Change in appetite 2 3 0 0  Feeling bad or failure about yourself  2 3 0 0  Trouble concentrating 2 3 0 0  Moving slowly or fidgety/restless 1 3 0 0  Suicidal thoughts 0 - 0 0  PHQ-9 Score 14 24 0 0  Difficult doing work/chores Very difficult - Not difficult at all Not difficult at all   PHQ9 is positive, but improved from February.  Will address further at routine follow up.  No results found for this or any previous visit (from the past 72 hour(s)).  Assessment & Plan  1. Abscess - Urgent referral placed. Unfortunately, this patient is unable to utilize video enabled technology and my evaluation is quite limited.  I will refer to general surgery for possible I&D and further evaluation as this has been an ongoing issue for the patient with current exacerbation. - Ambulatory referral to General Surgery  2. Homelessness - Ambulatory referral to Chronic Care Management Services  3. Crack cocaine use - Discussed cessation, he is going to [place], recommend NA meetings as well.    -Red flags and when to present for emergency care or RTC including fever >101.76F, chest pain, shortness of breath, new/worsening/un-resolving symptoms, reviewed with patient at time of visit. Follow up and care instructions discussed and provided in AVS. - I discussed the assessment and treatment plan with the patient. The patient was provided an opportunity to ask questions and all were answered. The patient agreed with the plan and demonstrated an understanding of the instructions.  - The patient was advised to call back or seek an in-person evaluation if the symptoms worsen or if the condition fails to improve as anticipated.  I provided 23 minutes of non-face-to-face time during this encounter.  [name], FNP

## 2018-12-17 NOTE — Patient Instructions (Signed)
Patient will need to return to the office as needed, take tylenol for pain. Use gauze for drainage. Will remove cyst in the near future.    Call the office with any questions or concerns.      Epidermal Cyst  An epidermal cyst is a small, painless lump under your skin. The cyst contains a grayish-white, bad-smelling substance (keratin). Do not try to pop or open an epidermal cyst yourself. What are the causes?  A blocked hair follicle.  A hair that curls and re-enters the skin instead of growing straight out of the skin.  A blocked pore.  Irritated skin.  An injury to the skin.  Certain conditions that are passed along from parent to child (inherited).  Human papillomavirus (HPV).  Long-term sun damage to the skin. What increases the risk?  Having acne.  Being overweight.  Being 4-28 years old. What are the signs or symptoms? These cysts are usually harmless, but they can get infected. Symptoms of infection may include:  Redness.  Inflammation.  Tenderness.  Warmth.  Fever.  A grayish-white, bad-smelling substance drains from the cyst.  Pus drains from the cyst. How is this treated? In many cases, epidermal cysts go away on their own without treatment. If a cyst becomes infected, treatment may include:  Opening and draining the cyst, done by a doctor. After draining, you may need minor surgery to remove the rest of the cyst.  Antibiotic medicine.  Shots of medicines (steroids) that help to reduce inflammation.  Surgery to remove the cyst. Surgery may be done if the cyst: ? Becomes large. ? Bothers you. ? Has a chance of turning into cancer.  Do not try to open a cyst yourself. Follow these instructions at home:  Take over-the-counter and prescription medicines only as told by your doctor.  If you were prescribed an antibiotic medicine, take it it as told by your doctor. Do not stop using the antibiotic even if you start to feel better.  Keep  the area around your cyst clean and dry.  Wear loose, dry clothing.  Avoid touching your cyst.  Check your cyst every day for signs of infection. Check for: ? Redness, swelling, or pain. ? Fluid or blood. ? Warmth. ? Pus or a bad smell.  Keep all follow-up visits as told by your doctor. This is important. How is this prevented?  Wear clean, dry, clothing.  Avoid wearing tight clothing.  Keep your skin clean and dry. Take showers or baths every day. Contact a doctor if:  Your cyst has symptoms of infection.  Your condition does not improve or gets worse.  You have a cyst that looks different from other cysts you have had.  You have a fever. Get help right away if:  Redness spreads from the cyst into the area close by. Summary  An epidermal cyst is a sac made of skin tissue.  If a cyst becomes infected, treatment may include surgery to open and drain the cyst, or to remove it.  Take over-the-counter and prescription medicines only as told by your doctor.  Contact a doctor if your condition is not improving or is getting worse.  Keep all follow-up visits as told by your doctor. This is important. This information is not intended to replace advice given to you by your health care provider. Make sure you discuss any questions you have with your health care provider. Document Released: 09/19/2004 Document Revised: 02/23/2018 Document Reviewed: 06/14/2015 Elsevier Interactive Patient Education  2019  [employer].

## 2018-12-17 NOTE — Progress Notes (Signed)
Surgical Clinic History and Physical  Referring provider:  [name], FNP [address] [place], [place] [address]  HISTORY OF PRESENT ILLNESS (HPI):  53 y.o. male presents for evaluation of painful Left upper back swelling. Patient reports the lesion has been present >1 year, at which time he says an ED physician "poked at it several times" (09/2017). He was evaluated by primary care physician today via telemedicine and is referred for evaluation of Left upper back painful swelling accordingly. Patient otherwise denies any fever/chills or drainage from the lesion. Of note, patient has a history of cocaine, marijuana, tobacco, and alcohol abuse, from which he says he'd been clean until his back surgery was rescheduled due to Covid-19 pandemic restrictions on elective surgeries. He last used crack cocaine 4/16 and remains homeless, sleeping at cousin's house.  PAST MEDICAL HISTORY (PMH):  Past Medical History:  Diagnosis Date  . Anxiety   . Depression   . Dyspnea   . Hypertension   . Neck pain   . Weakness of extremity Right arm and hand    PAST SURGICAL HISTORY ([place]):  Past Surgical History:  Procedure Laterality Date  . COLONOSCOPY WITH PROPOFOL N/A 10/15/2018   Procedure: COLONOSCOPY WITH PROPOFOL;  Surgeon: Jonathon Bellows, MD;  Location[facility] ENDOSCOPY;  Service: Gastroenterology;  Laterality: N/A;  . NO PAST SURGERIES      MEDICATIONS:  Prior to Admission medications   Medication Sig Start Date End Date Taking? Authorizing Provider  busPIRone (BUSPAR) 5 MG tablet Take 1 tablet (5 mg total) by mouth 2 (two) times daily as needed. Patient taking differently: Take 5 mg by mouth daily.  10/05/18  Yes [name], FNP  DULoxetine (CYMBALTA) 30 MG capsule Take 1 capsule by mouth once daily x7 days, then increase to one capsule by mouth twice daily Patient taking differently: Take 30 mg by mouth daily.  10/05/18  Yes [name], FNP  hydrochlorothiazide (HYDRODIURIL)  12.5 MG tablet Take by mouth.   Yes [provider]    ALLERGIES:  Allergies  Allergen Reactions  . Septra [Sulfamethoxazole-Trimethoprim] Other (See Comments)    Renal Failure  . Lactose Nausea And Vomiting    SOCIAL HISTORY:  Social History   Socioeconomic History  . Marital status: Single    Spouse name: Not on file  . Number of children: 2  . Years of education: Not on file  . Highest education level: High school graduate  Occupational History  . Occupation: unemployed  Social Needs  . Financial resource strain: Very hard  . Food insecurity:    Worry: Often true    Inability: Often true  . Transportation needs:    Medical: Yes    Non-medical: Yes  Tobacco Use  . Smoking status: Current Some Day Smoker    Packs/day: 0.25    Types: Cigarettes  . Smokeless tobacco: Never Used  Substance and Sexual Activity  . Alcohol use: Not Currently    Comment: rarely   . Drug use: Yes    Types: Marijuana, Cocaine  . Sexual activity: Not Currently    Partners: Female  Lifestyle  . Physical activity:    Days per week: 0 days    Minutes per session: 0 min  . Stress: Very much  Relationships  . Social connections:    Talks on phone: Never    Gets together: Never    Attends religious service: 1 to 4 times per year    Active member of club  or organization: No    Attends meetings of clubs or organizations: Never    Relationship status: Never married  . Intimate partner violence:    Fear of current or ex partner: No    Emotionally abused: No    Physically abused: No    Forced sexual activity: No  Other Topics Concern  . Not on file  Social History Narrative  . Not on file    The patient currently resides (home / rehab facility / nursing home): Homeless, currently living at cousin's house The patient normally is (ambulatory / bedbound): Ambulatory  FAMILY HISTORY:  History reviewed. No pertinent family history.  Otherwise negative/non-contributory.  REVIEW OF  SYSTEMS:  Constitutional: denies any other weight loss, fever, chills, or sweats  Eyes: denies any other vision changes, history of eye injury  ENT: denies sore throat, hearing problems  Respiratory: denies shortness of breath, wheezing  Cardiovascular: denies chest pain, palpitations  Gastrointestinal: denies abdominal pain, N/V, or diarrhea Musculoskeletal: denies any other joint pains or cramps  Skin: Denies any other rashes or skin discolorations except as per HPI Neurological: denies any other headache, dizziness, weakness  Psychiatric: Denies any other depression, anxiety   All other review of systems were otherwise negative   VITAL SIGNS:  BP 112/72   Pulse 68   Temp 97.7 F (36.5 C) (Temporal)   Ht 5\' 6"  (1.676 m)   Wt 129 lb 3.2 oz (58.6 kg)   SpO2 100%   BMI 20.85 kg/m    PHYSICAL EXAM:  Constitutional:  -- Normal body habitus  -- Awake, alert, and oriented x3  Eyes:  -- Pupils equally round and reactive to light  -- No scleral icterus  Ear, nose, throat:  -- No jugular venous distension -- No nasal drainage, bleeding Pulmonary:  -- No crackles  -- Equal breath sounds bilaterally -- Breathing non-labored at rest Cardiovascular:  -- S1, S2 present  -- No pericardial rubs  Gastrointestinal:  -- Abdomen soft, non-tender to palpation, non-distended, no guarding/rebound tenderness -- No abdominal masses appreciated, pulsatile or otherwise  Musculoskeletal and Integumentary:  -- Wounds or skin discoloration: firm mildly-/moderately- tender to palpation/compression 1.5 - 2 cm Left upper back subcutaneous mass with central non-draining pore without surrounding erythema -- Extremities: B/L UE and LE FROM, hands and feet warm, no edema  Neurologic:  -- Motor function: Intact and symmetric -- Sensation: Intact and symmetric  Labs:  CBC Latest Ref Rng & Units 11/10/2018 07/11/2018  WBC 4.0 - 10.5 K/uL 4.8 4.0  Hemoglobin 13.0 - 17.0 g/dL 12.7(L) 15.1  Hematocrit  39.0 - 52.0 % 37.4(L) 44.3  Platelets 150 - 400 K/uL 202 158   CMP Latest Ref Rng & Units 11/10/2018 10/05/2018 07/11/2018  Glucose 70 - 99 mg/dL 93 80 95  BUN 6 - 20 mg/dL 15 14 11   Creatinine 0.61 - 1.24 mg/dL 1.41(H) 1.66(H) 1.30(H)  Sodium 135 - 145 mmol/L 138 139 139  Potassium 3.5 - 5.1 mmol/L 4.0 4.2 3.6  Chloride 98 - 111 mmol/L 109 107 107  CO2 22 - 32 mmol/L 23 25 25   Calcium 8.9 - 10.3 mg/dL 8.6(L) 9.2 8.9  Total Protein 6.1 - 8.1 g/dL - 7.3 -  Total Bilirubin 0.2 - 1.2 mg/dL - 0.9 -  AST 10 - 35 U/L - 18 -  ALT 9 - 46 U/L - 12 -   Imaging studies: No recent pertinent imaging studies available for review at this time   Assessment/Plan:  53 y.o. male  with a painful enlarged currently non-infected Left upper back sebaceous cyst, complicated by co-morbidities including HTN, chronic recurrent drug abuse (crack cocaine, marijuana, alcohol, and tobacco/smoking), chronic neck/back pain, major depression disorder, and generalized anxiety disorder.   - differential diagnoses discussed  - all risks, benefits, and alternatives to in-office excision of Left upper back non-infected sebaceous cyst (including risk of recurrence) were discussed with the patient, all of his questions were answered to his expressed satisfaction, patient expresses he wishes to proceed, and informed consent was obtained.  - will plan for in-office excision of painful non-infected Left upper back sebaceous cyst when restrictions on elective surgeries due to Covid-19 pandemic can safely be relieved  - indications for which to call office and/or seek further evaluation discussed  - generalized health measures including cessation of cocaine discussed   - instructed to call if any questions or concerns  All of the above recommendations were discussed with the patient, and all of patient's questions were answered to his expressed satisfaction.  Thank you for the opportunity to participate in this patient's  care.  -- Marilynne Drivers Rosana Hoes, MD, [place]: [facility] - Partnering for exceptional care. Office: [phone]

## 2018-12-18 ENCOUNTER — Ambulatory Visit: Payer: Self-pay

## 2018-12-18 DIAGNOSIS — Z5900 Homelessness unspecified: Secondary | ICD-10-CM

## 2018-12-18 DIAGNOSIS — Z131 Encounter for screening for diabetes mellitus: Secondary | ICD-10-CM

## 2018-12-18 DIAGNOSIS — F1490 Cocaine use, unspecified, uncomplicated: Secondary | ICD-10-CM

## 2018-12-18 NOTE — Chronic Care Management (AMB) (Signed)
  Care Management   Note  12/18/2018 Name: VALOR QUAINTANCE MRN: [id number] DOB: 12/02/1965  Care Coordination: Mr. Fortunato Nordin is a 53 year old patient of Raelyn Ensign, FNP who was in the office today to establish care with her for primary care. CCM RN CM received a call from Ms. Boyce requesting assistance from the Chronic Care Management team for emergency housing. 09/18/2018 CCM RN CM spoke with patientand girlfriend DeeDeevia telephone speakerphone. Patients only medical history provided in EMR at that time was ongoing shoulder, neck and back pain that is followed by [facility] neurosurgery.Patient was provided resources at that time with available shelters however he chose not to follow up because he wanted to remain in [place].  Second referral has been placed for ongoing assistance with social needs. Patient will be engaged by [facility] for assessment of social determinants of health and resources.  Follow Up Plan: Patient scheduled with CCM SW    Malic Rosten E. Rollene Rotunda, RN, BSN Nurse Care Coordinator [facility] Care Management  [phone]

## 2018-12-22 ENCOUNTER — Ambulatory Visit: Payer: Self-pay | Admitting: *Deleted

## 2018-12-22 ENCOUNTER — Encounter: Payer: Self-pay | Admitting: *Deleted

## 2018-12-22 DIAGNOSIS — F1490 Cocaine use, unspecified, uncomplicated: Secondary | ICD-10-CM

## 2018-12-22 DIAGNOSIS — Z5900 Homelessness unspecified: Secondary | ICD-10-CM

## 2018-12-22 NOTE — Patient Instructions (Signed)
Thank you allowing the Chronic Care Management Team to be a part of your care! It was a pleasure speaking with you today!  1. Please contact the [facility] regarding housing and substance abuse treatment. 2. Please contact [name] for out patient substance abuse counseling 3. Please contact this CCM social worker with any questions or concerns.      CCM (Chronic Care Management) Team   Trish Fountain RN, BSN Nurse Care Coordinator  [phone]  Ruben Reason PharmD  Clinical Pharmacist  [phone]   Elliot Gurney, LCSW Clinical Social Worker [phone]  Goals Addressed            This Visit's Progress   . "I need to live  in a better environment" (pt-stated)       Current Barriers:  . Financial constraints . Limited social support . Housing barriers . Mental Health Concerns  . Substance abuse issues - crack cocaine use  Clinical Social Work Clinical Goal(s):  [name] Over the next 30 days, client will work with SW to address concerns related to housing and substance abuse treatment  Interventions: . Patient interviewed and appropriate assessments performed . Provided mental health counseling with regard to stress and depression (mental health diagnosis or concern) . Provided patient with information about [name] [phone] and [place] outpatient substance abuse treatment . Discussed plans with patient for ongoing care management follow up and provided patient with direct contact information for care management team . Advised patient to contact [name] and [name] for outpatient substance abuse treatment.  Patient Self Care Activities:  . Self administers medications as prescribed . Attends all scheduled provider appointments . Calls provider office for new concerns or questions  Initial goal documentation         The patient verbalized understanding of instructions provided today and declined a print copy of patient  instruction materials.   The patient will call the [facility] and [name] * as advised to follow up on housing and substance abuse treatment.

## 2018-12-22 NOTE — Chronic Care Management (AMB) (Addendum)
   Care Management    Clinical Social Work General Note  12/22/2018 Name: James Padilla MRN: [id number] DOB: May 08, 1966  James Padilla is a 53 y.o. year old male who is a primary care patient of James Hartshorn, FNP. The CCM was consulted to assist the patient with [employer] related to housing, food insecurity and substance abuse.     SDOH (Social Determinants of Health) screening performed today. See Care Plan Entry related to challenges with: Financial Strain  Food Insecurity  Depression   Alcohol/Substance Use Stress  Goals Addressed            This Visit's Progress   . "I need to live  in a better environment" (pt-stated)       This social worker spoke with patient today to determine community resource needs. Per patient, he is unemployed and has no income at this time. Patient states that has been currently living  on a friends couch for the last 2-3 months, however it is not the best environment. Per patient, he did reside in a boarding house prior but had to move out when his unemployment stopped. Patient lost his vehicle at that time as well. Patient does receive Medicaid and food stamps(190.00 monthly) and is currently working with [employer] for assistance with re-applying for disability due to his history of denials. Patient uses medicaid transpotration to medical appointments and is able to take his medications independently. Patient states that he has 2 children, his daughter is helping to maintain his cell phone bill. Patient admits to recent drug use and living in an environment "that is just not good for me". Patient reports wanting to reside in the [place] area so that he can keep his current medical providers. Patient discussed increased stress and depression related to his current situation.     Current Barriers:  . Financial constraints . Limited social support . Housing barriers . Mental Health Concerns  . Substance abuse issues - crack  cocaine use  Clinical Social Work Clinical Goal(s):  [name] Over the next 30 days, client will work with SW to address concerns related to housing and substance abuse treatment  Interventions: . Patient interviewed and appropriate assessments performed . Provided mental health counseling with regard to stress and depression related to current situation. . Provided patient with information about [name] [phone] and [place] outpatient substance abuse treatment . Discussed plans with patient for ongoing care management follow up and provided patient with direct contact information for care management team . Advised patient to contact [name] and [name] for outpatient substance abuse treatment.   Patient Self Care Activities:  . Self administers medications as prescribed . Attends all scheduled provider appointments . Calls provider office for new concerns or questions  Initial goal documentation         Follow Up Plan: SW will follow up with patient by phone over the next 2 weeks       [place], [place] Worker  [facility] Care Management [phone]

## 2018-12-28 MED ORDER — HYDROCHLOROTHIAZIDE 12.5 MG PO TABS: 12.5000 mg | ORAL_TABLET | Freq: Every day | ORAL | 1 refills | Status: DC

## 2018-12-31 ENCOUNTER — Ambulatory Visit: Payer: Self-pay | Admitting: *Deleted

## 2018-12-31 DIAGNOSIS — Z5900 Homelessness unspecified: Secondary | ICD-10-CM

## 2018-12-31 DIAGNOSIS — F1490 Cocaine use, unspecified, uncomplicated: Secondary | ICD-10-CM

## 2018-12-31 NOTE — Chronic Care Management (AMB) (Signed)
   Care Management    Clinical Social Work Follow Up Note  12/31/2018 Name: James Padilla MRN: [id number] DOB: [birth year]  James Padilla is a 53 y.o. year old male who is a primary care patient of James Hartshorn, FNP. The CCM team was consulted for assistance with community resources for housing, food insecurity and substance abuse.  Review of patient status, including review of consultants reports, other relevant assessments, and collaboration with appropriate care team members and the patient's provider was performed as part of comprehensive patient evaluation and provision of chronic care management services.     Goals Addressed            This Visit's Progress   . "I need to live  in a better environment" (pt-stated)       This social worker spoke to patient today to follow up on community resources provided to him for housing and substance abuse treatment. Patient confirmed that he has received the resources, however has not made any contacts yet. Per patient, he was not feeling well today and has made an appointment to see James Padilla on Monday. Patient encouraged to contact the [facility] for housing as well as [name] for outpatient substance abuse treatment. Patient agreed to contact the resources provided.    Current Barriers:  . Financial constraints . Limited social support . Housing barriers . Mental Health Concerns  . Substance abuse issues - crack cocaine use  Clinical Social Work Clinical Goal(s):  [name] Over the next 30 days, client will work with SW to address concerns related to housing and substance abuse treatment  Interventions: . Followed up on community resources provided to patient for the  [place] and [place] outpatient substance abuse treatment [phone] . Patient stated that he has not contacted the resources to date . Advised patient to contact [name] and [name] for outpatient substance abuse treatment within the week .  Discussed plans with patient for ongoing care management follow up and provided patient with direct contact information for care management team   Patient Self Care Activities:  . Self administers medications as prescribed . Attends all scheduled provider appointments . Calls provider office for new concerns or questions  Please see past updates related to this goal by clicking on the "Past Updates" button in the selected goal          Follow Up Plan: SW will follow up with patient by phone over the next 2 weeks   [place], [place] Worker  [facility] Care Management [phone]

## 2018-12-31 NOTE — Patient Instructions (Signed)
Thank you allowing the Chronic Care Management Team to be a part of your care! It was a pleasure speaking with you today!  1. Please contact the [name] for housing resources [phone] 2. Please contact [name] for outpatient substance abuse resources [phone]      CCM (Chronic Care Management) Team   Trish Fountain RN, BSN Nurse Care Coordinator  [phone]  Ruben Reason PharmD  Clinical Pharmacist  [phone]   Elliot Gurney, LCSW Clinical Social Worker [phone]  Goals Addressed            This Visit's Progress   . "I need to live  in a better environment" (pt-stated)       Current Barriers:  . Financial constraints . Limited social support . Housing barriers . Mental Health Concerns  . Substance abuse issues - crack cocaine use  Clinical Social Work Clinical Goal(s):  [name] Over the next 30 days, client will work with SW to address concerns related to housing and substance abuse treatment  Interventions: . Followed up on community resources provided to patient for the  [place] and [place] outpatient substance abuse treatment . Patient stated that he has not contacted the resources to date . Advised patient to contact [name] and [name] for outpatient substance abuse treatment within the week . Discussed plans with patient for ongoing care management follow up and provided patient with direct contact information for care management team   Patient Self Care Activities:  . Self administers medications as prescribed . Attends all scheduled provider appointments . Calls provider office for new concerns or questions  Please see past updates related to this goal by clicking on the "Past Updates" button in the selected goal          The patient verbalized understanding of instructions provided today and declined a print copy of patient instruction materials.   The CM team will reach out to the  patient again over the next 14 days.

## 2019-01-04 ENCOUNTER — Ambulatory Visit (INDEPENDENT_AMBULATORY_CARE_PROVIDER_SITE_OTHER): Payer: Medicaid Other | Admitting: Family Medicine

## 2019-01-04 ENCOUNTER — Encounter: Payer: Self-pay | Admitting: Family Medicine

## 2019-01-04 ENCOUNTER — Other Ambulatory Visit: Payer: Self-pay

## 2019-01-04 VITALS — BP 138/72 | HR 84 | Temp 98.1°F | Resp 18 | Ht 66.0 in | Wt 129.6 lb

## 2019-01-04 DIAGNOSIS — M542 Cervicalgia: Secondary | ICD-10-CM | POA: Diagnosis not present

## 2019-01-04 DIAGNOSIS — F1490 Cocaine use, unspecified, uncomplicated: Secondary | ICD-10-CM

## 2019-01-04 DIAGNOSIS — R519 Headache, unspecified: Secondary | ICD-10-CM

## 2019-01-04 DIAGNOSIS — Z5900 Homelessness unspecified: Secondary | ICD-10-CM

## 2019-01-04 DIAGNOSIS — M549 Dorsalgia, unspecified: Secondary | ICD-10-CM

## 2019-01-04 DIAGNOSIS — I10 Essential (primary) hypertension: Secondary | ICD-10-CM | POA: Insufficient documentation

## 2019-01-04 DIAGNOSIS — Z716 Tobacco abuse counseling: Secondary | ICD-10-CM

## 2019-01-04 DIAGNOSIS — R51 Headache: Secondary | ICD-10-CM

## 2019-01-04 DIAGNOSIS — G8929 Other chronic pain: Secondary | ICD-10-CM

## 2019-01-04 DIAGNOSIS — L723 Sebaceous cyst: Secondary | ICD-10-CM

## 2019-01-04 DIAGNOSIS — F332 Major depressive disorder, recurrent severe without psychotic features: Secondary | ICD-10-CM | POA: Diagnosis not present

## 2019-01-04 DIAGNOSIS — R454 Irritability and anger: Secondary | ICD-10-CM

## 2019-01-04 DIAGNOSIS — M79601 Pain in right arm: Secondary | ICD-10-CM

## 2019-01-04 MED ORDER — DULOXETINE HCL 30 MG PO CPEP: 30.0000 mg | ORAL_CAPSULE | Freq: Every day | ORAL | 0 refills | Status: DC

## 2019-01-04 MED ORDER — BUSPIRONE HCL 5 MG PO TABS: 5.0000 mg | ORAL_TABLET | Freq: Every day | ORAL | 1 refills | Status: DC

## 2019-01-04 NOTE — Progress Notes (Signed)
Name: James Padilla   MRN: [id number]    DOB: 01-16-1966   Date:01/04/2019       Progress Note  Subjective  Chief Complaint  Chief Complaint  Patient presents with  . Follow-up    I connected with  James Padilla  on 01/04/19 at 10:20 AM EDT by a video enabled telemedicine application and verified that I am speaking with the correct person using two identifiers.  I discussed the limitations of evaluation and management by telemedicine and the availability of in person appointments. The patient expressed understanding and agreed to proceed. Staff also discussed with the patient that there may be a patient responsible charge related to this service. Patient Location: [place] Provider Location: Home Additional Individuals present: None  HPI  Sebaceous Cyst LEFT Shoulder: He saw Dr. Rosana Hoes and he is planning to remove the cyst when restrictions from COVID-19 are lifted.  He is doing okay - haing some purulent drainage and some pain.   Homelessness: Sleeping on his cousin's couch. He is working with IT trainer, LCSW with CCM team to obtain housing and possibly disability.  RIGHT shoulder, neck, and upper back pain:Ongoing for many years, but states worsening lately.Was taking gabapentin and tramadol without relief of pain, so he stopped. He is working on obtaining disability. I advised patient of our office policy regarding opiate prescriptions, and that I will not be prescribing any additional pain medications, he verbalizes understanding.Has been seeing [facility] neurosurgery - he last spoke with Neurosurgery 12/29/2018 and is hoping to have surgery later this month pending clearance from COVID-19 pandemic.   Chronic Headaches: He has been getting headaches for many years.  He does note some worsening vision lately - was referred to an eye doctor and has new prescription; finds he gets headaches when he doesn't have his glasses on.   Depression: Doing better today - only taking Cymbalta some days  - encouraged better compliance, taking buspar daily.  He is He is under a lot of stress lately, is homeless, dealing with significant chronic pain, and is working on obtaining disability.  Denies SI/HI. He does endorse being easily irritable, is using ccrack cocaine weekly and still smoking cigarettes.  HTN: Doing well on HCTZ, last BMP showed stable kidney function.  BP is upper limit of normal today. Had chronic headaches, but no worsening; no chest pain, shortness of breath, or BLE edema.  Crack Cocaine Use/Tobacco Use: Trying to quit again; last use he states was last Wednesday (~5 days ago).  Encouraged cessation and discussed health risks with him again today.    Office Visit from 01/04/2019 in [facility]  PHQ-9 Total Score  8      Patient Active Problem List   Diagnosis Date Noted  . Abscess 12/17/2018  . Crack cocaine use 12/17/2018  . Sebaceous cyst 12/17/2018  . Nonintractable episodic headache 10/05/2018  . Vision changes 10/05/2018  . Frequency of urination and polyuria 10/05/2018  . Diabetes mellitus screening 10/05/2018  . Severe episode of recurrent major depressive disorder, without psychotic features ([place]) 10/05/2018  . Irritability and anger 10/05/2018  . Homelessness 09/19/2018  . Chronic neck pain 09/19/2018  . Chronic bilateral back pain 09/19/2018  . Right arm pain 08/14/2018    Past Surgical History:  Procedure Laterality Date  . COLONOSCOPY WITH PROPOFOL N/A 10/15/2018   Procedure: COLONOSCOPY WITH PROPOFOL;  Surgeon: Jonathon Bellows, MD;  Location: [facility] ENDOSCOPY;  Service: Gastroenterology;  Laterality: N/A;  . NO PAST SURGERIES  No family history on file.  Social History   Socioeconomic History  . Marital status: Single    Spouse name: Not on file  . Number of children: 2  . Years of education: 60  . Highest education level: High school graduate  Occupational History  . Occupation: unemployed  Social Needs  . Financial  resource strain: Very hard  . Food insecurity:    Worry: Often true    Inability: Often true  . Transportation needs:    Medical: Yes    Non-medical: Yes  Tobacco Use  . Smoking status: Current Some Day Smoker    Packs/day: 0.25    Types: Cigarettes  . Smokeless tobacco: Never Used  Substance and Sexual Activity  . Alcohol use: Not Currently    Comment: rarely   . Drug use: Yes    Frequency: 2.0 times per week    Types: Marijuana, Cocaine  . Sexual activity: Not Currently    Partners: Female  Lifestyle  . Physical activity:    Days per week: 0 days    Minutes per session: 0 min  . Stress: Very much  Relationships  . Social connections:    Talks on phone: Never    Gets together: Never    Attends religious service: 1 to 4 times per year    Active member of club or organization: No    Attends meetings of clubs or organizations: Never    Relationship status: Never married  . Intimate partner violence:    Fear of current or ex partner: No    Emotionally abused: No    Physically abused: No    Forced sexual activity: No  Other Topics Concern  . Not on file  Social History Narrative  . Not on file     Current Outpatient Medications:  .  busPIRone (BUSPAR) 5 MG tablet, Take 1 tablet (5 mg total) by mouth 2 (two) times daily as needed. (Patient taking differently: Take 5 mg by mouth daily. ), Disp: 60 tablet, Rfl: 0 .  DULoxetine (CYMBALTA) 30 MG capsule, Take 1 capsule by mouth once daily x7 days, then increase to one capsule by mouth twice daily (Patient taking differently: Take 30 mg by mouth daily. ), Disp: 180 capsule, Rfl: 1 .  hydrochlorothiazide (HYDRODIURIL) 12.5 MG tablet, Take 1 tablet (12.5 mg total) by mouth daily., Disp: 90 tablet, Rfl: 1  Allergies  Allergen Reactions  . Septra [Sulfamethoxazole-Trimethoprim] Other (See Comments)    Renal Failure  . Lactose Nausea And Vomiting    I personally reviewed active problem list, medication list, allergies,  health maintenance, notes from last encounter, lab results with the patient/caregiver today.   ROS Constitutional: Negative for fever or weight change.  Respiratory: Negative for cough and shortness of breath.   Cardiovascular: Negative for chest pain or palpitations.  Gastrointestinal: Negative for abdominal pain, no bowel changes.  Musculoskeletal: Negative for gait problem or joint swelling.  Skin: Negative for rash.  Neurological: Negative for dizziness or headache.  No other specific complaints in a complete review of systems (except as listed in HPI above).  Objective  Today's Vitals   01/04/19 1026 01/04/19 1027  BP: 138/72   Pulse: 84   Resp: 18   Temp: 98.1 F (36.7 C)   TempSrc: Oral   SpO2: 96%   Weight: 129 lb 9.6 oz (58.8 kg)   Height: 5\' 6"  (1.676 m)   PainSc:  10-Worst pain ever   Body mass index is 20.92 kg/m.  Body mass index is 20.92 kg/m.  Physical Exam Constitutional: Patient appears well-developed and well-nourished. No distress.  HENT: Head: Normocephalic and atraumatic.  Neck: Normal range of motion. Pulmonary/Chest: Effort normal. No respiratory distress. Speaking in complete sentences Neurological: Pt is alert and oriented to person, place, and time. Coordination, speech and gait are normal.  Psychiatric: Patient has a normal mood and affect. behavior is normal. Judgment and thought content normal. Skin: Able to visualize Cyst on LEFT upper shoulder today - area is hyperpigmented with mild underlying erythema, no visible exudate.  No results found for this or any previous visit (from the past 72 hour(s)).  PHQ2/9: Depression screen [facility] 2/9 01/04/2019 12/22/2018 12/17/2018 10/05/2018 10/05/2018  Decreased Interest 1 1 1 3  0  Down, Depressed, Hopeless 1 1 2 3  0  PHQ - 2 Score 2 2 3 6  0  Altered sleeping 2 2 2 3  0  Tired, decreased energy 1 1 2 3  0  Change in appetite 1 1 2 3  0  Feeling bad or failure about yourself  1 2 2 3  0  Trouble  concentrating 1 1 2 3  0  Moving slowly or fidgety/restless 0 0 1 3 0  Suicidal thoughts 0 0 0 - 0  PHQ-9 Score 8 9 14 24  0  Difficult doing work/chores Very difficult Very difficult Very difficult - Not difficult at all   PHQ-2/9 Result is positive.    Fall Risk: Fall Risk  01/04/2019 12/22/2018 12/17/2018 12/17/2018 10/05/2018  Falls in the past year? 0 0 0 0 1  Number falls in past yr: 0 - 0 0 1  Injury with Fall? 0 - 0 0 1  Risk for fall due to : - - - - History of fall(s)  Follow up - - - - Falls evaluation completed    Assessment & Plan  1. Sebaceous cyst - Follow up with Dr. Rosana Hoes - plan is to excise once COVID-19 pandemic restrictions allow  2. Homelessness - Working with CCM LCSW for housing.  3. Chronic neck pain - Continue with neurosurgery - DULoxetine (CYMBALTA) 30 MG capsule; Take 1 capsule (30 mg total) by mouth daily.  Dispense: 90 capsule; Refill: 0  4. Chronic bilateral back pain, unspecified back location - Continue with neurosurgery - DULoxetine (CYMBALTA) 30 MG capsule; Take 1 capsule (30 mg total) by mouth daily.  Dispense: 90 capsule; Refill: 0  5. Right arm pain - Continue with neurosurgery - DULoxetine (CYMBALTA) 30 MG capsule; Take 1 capsule (30 mg total) by mouth daily.  Dispense: 90 capsule; Refill: 0  6. Nonintractable episodic headache, unspecified headache type - Improving since obtaining glasses; recommend crack cocaine cessation, continue with neurosurgery, tylenol PRN.2  7. Severe episode of recurrent major depressive disorder, without psychotic features ([facility]) - DULoxetine (CYMBALTA) 30 MG capsule; Take 1 capsule (30 mg total) by mouth daily.  Dispense: 90 capsule; Refill: 0 - busPIRone (BUSPAR) 5 MG tablet; Take 1 tablet (5 mg total) by mouth daily.  Dispense: 90 tablet; Refill: 1  8. Irritability and anger - DULoxetine (CYMBALTA) 30 MG capsule; Take 1 capsule (30 mg total) by mouth daily.  Dispense: 90 capsule; Refill: 0 - busPIRone (BUSPAR)  5 MG tablet; Take 1 tablet (5 mg total) by mouth daily.  Dispense: 90 tablet; Refill: 1  9. Essential hypertension - Stable on HCTZ low dose.  10. Crack cocaine use - Encouraged cessation  11. Encounter for tobacco use cessation counseling Encouraged cessation   I discussed the assessment and  treatment plan with the patient. The patient was provided an opportunity to ask questions and all were answered. The patient agreed with the plan and demonstrated an understanding of the instructions.  The patient was advised to call back or seek an in-person evaluation if the symptoms worsen or if the condition fails to improve as anticipated.  I provided 26 minutes of non-face-to-face time during this encounter.

## 2019-01-07 ENCOUNTER — Ambulatory Visit: Payer: Self-pay | Admitting: *Deleted

## 2019-01-07 DIAGNOSIS — Z5900 Homelessness unspecified: Secondary | ICD-10-CM

## 2019-01-07 DIAGNOSIS — F1490 Cocaine use, unspecified, uncomplicated: Secondary | ICD-10-CM

## 2019-01-07 NOTE — Patient Instructions (Signed)
Thank you allowing the Chronic Care Management Team to be a part of your care! It was a pleasure speaking with you today!  1. Please contact the [name] for housing options [phone] and [name]- outpatient substance abuse treatment [phone]  CCM (Chronic Care Management) Team   Trish Fountain RN, BSN Nurse Care Coordinator  [phone]  Ruben Reason PharmD  Clinical Pharmacist  [phone]   Elliot Gurney, LCSW Clinical Social Worker [phone]  Goals Addressed            This Visit's Progress   . "I need to live  in a better environment" (pt-stated)       Current Barriers:  . Financial constraints . Limited social support . Housing barriers . Mental Health Concerns  . Substance abuse issues - crack cocaine use  Clinical Social Work Clinical Goal(s):  [name] Over the next 30 days, client will work with SW to address concerns related to housing and substance abuse treatment  Interventions: . Followed up on community resources provided to patient for the  [place] and [place] outpatient substance abuse treatment [phone] . Patient stated that he has not contacted the resources to date . Patient goal and motivation to gain his independence and move out on his own re-visited . Patient goal to remain substance free also revisted . Advised patient to contact [name] and [name] for outpatient substance abuse treatment within the week . Discussed expectation that he will have called the community resources provided within the next week . Discussed plans with patient for ongoing care management follow up and provided patient with direct contact information for care management team   Patient Self Care Activities:  . Self administers medications as prescribed . Attends all scheduled provider appointments . Calls provider office for new concerns or questions  Please see past updates related to this goal by clicking  on the "Past Updates" button in the selected goal          The patient verbalized understanding of instructions provided today and declined a print copy of patient instruction materials.   The CM team will reach out to the patient again over the next 7  days.

## 2019-01-07 NOTE — Chronic Care Management (AMB) (Signed)
  Chronic Care Management    Clinical Social Work Follow Up Note  01/07/2019 Name: James Padilla MRN: [id number] DOB: [birth year]  James Padilla is a 53 y.o. year old male who is a primary care patient of [name], FNP. The CCM team was consulted for assistance with housing, food insecurity and substance abuse.   Review of patient status, including review of consultants reports, other relevant assessments, and collaboration with appropriate care team members and the patient's provider was performed as part of comprehensive patient evaluation and provision of chronic care management services.     Goals Addressed            This Visit's Progress   . "I need to live  in a better environment" (pt-stated)       Phone call to patient today to follow up on resources provided for housing and substance abuse treatment. Per patient, he has not contacted the resources provided, however intends to. Patient states that he has not done any drugs for the last  2 weeks and feels he is in a good place right now to begin substance abuse treatment. Patient discussed realizing that the environment that he is in currently is not a positive supportive one and he is ready for a change. Patient denies food insecurity, receiving $109.00 per month.  Current Barriers:  . 33Financial constraints . Limited social support . Housing barriers . Mental Health Concerns  . Substance abuse issues - crack cocaine use  Clinical Social Work Clinical Goal(s):  [name] Over the next 30 days, client will work with SW to address concerns related to housing and substance abuse treatment  Interventions: . Followed up on community resources provided to patient for the  [place] and [place] outpatient substance abuse treatment [phone] . Patient stated that he has not contacted the resources to date . Patient goal and motivation to gain his independence and move out on his own re-visited . Patient goal to  remain substance free also revisted . Advised patient to contact [name] and [name] for outpatient substance abuse treatment within the week . Discussed expectation that he will have called the community resources provided within the next week. . Discussed plans with patient for ongoing care management follow up and provided patient with direct contact information for care management team   Patient Self Care Activities:  . Self administers medications as prescribed . Attends all scheduled provider appointments . Calls provider office for new concerns or questions  Please see past updates related to this goal by clicking on the "Past Updates" button in the selected goal          Follow Up Plan: SW will follow up with patient by phone over the next 2 weeks  [place], [place] Worker  [facility] Care Management [phone]

## 2019-02-09 ENCOUNTER — Encounter: Payer: Self-pay | Admitting: Family Medicine

## 2019-02-09 ENCOUNTER — Other Ambulatory Visit: Payer: Self-pay

## 2019-02-09 ENCOUNTER — Ambulatory Visit (INDEPENDENT_AMBULATORY_CARE_PROVIDER_SITE_OTHER): Payer: Medicaid Other | Admitting: Family Medicine

## 2019-02-09 VITALS — BP 132/88 | HR 64 | Temp 98.2°F | Resp 14 | Ht 65.0 in | Wt 125.3 lb

## 2019-02-09 DIAGNOSIS — Z122 Encounter for screening for malignant neoplasm of respiratory organs: Secondary | ICD-10-CM | POA: Diagnosis not present

## 2019-02-09 DIAGNOSIS — K1379 Other lesions of oral mucosa: Secondary | ICD-10-CM | POA: Diagnosis not present

## 2019-02-09 DIAGNOSIS — Z0000 Encounter for general adult medical examination without abnormal findings: Secondary | ICD-10-CM

## 2019-02-09 DIAGNOSIS — Z716 Tobacco abuse counseling: Secondary | ICD-10-CM | POA: Diagnosis not present

## 2019-02-09 MED ORDER — LIDOCAINE VISCOUS HCL 2 % MT SOLN: 15.0000 mL | OROMUCOSAL | 0 refills | Status: DC | PRN

## 2019-02-09 NOTE — Patient Instructions (Signed)
Preventive Care 40-64 Years, Male Preventive care refers to lifestyle choices and visits with your health care provider that can promote health and wellness. What does preventive care include?   A yearly physical exam. This is also called an annual well check.  Dental exams once or twice a year.  Routine eye exams. Ask your health care provider how often you should have your eyes checked.  Personal lifestyle choices, including: ? Daily care of your teeth and gums. ? Regular physical activity. ? Eating a healthy diet. ? Avoiding tobacco and drug use. ? Limiting alcohol use. ? Practicing safe sex. ? Taking low-dose aspirin every day starting at age 50. What happens during an annual well check? The services and screenings done by your health care provider during your annual well check will depend on your age, overall health, lifestyle risk factors, and family history of disease. Counseling Your health care provider may ask you questions about your:  Alcohol use.  Tobacco use.  Drug use.  Emotional well-being.  Home and relationship well-being.  Sexual activity.  Eating habits.  Work and work environment. Screening You may have the following tests or measurements:  Height, weight, and BMI.  Blood pressure.  Lipid and cholesterol levels. These may be checked every 5 years, or more frequently if you are over 50 years old.  Skin check.  Lung cancer screening. You may have this screening every year starting at age 55 if you have a 30-pack-year history of smoking and currently smoke or have quit within the past 15 years.  Colorectal cancer screening. All adults should have this screening starting at age 50 and continuing until age 75. Your health care provider may recommend screening at age 45. You will have tests every 1-10 years, depending on your results and the type of screening test. People at increased risk should start screening at an earlier age. Screening tests may  include: ? Guaiac-based fecal occult blood testing. ? Fecal immunochemical test (FIT). ? Stool DNA test. ? Virtual colonoscopy. ? Sigmoidoscopy. During this test, a flexible tube with a tiny camera (sigmoidoscope) is used to examine your rectum and lower colon. The sigmoidoscope is inserted through your anus into your rectum and lower colon. ? Colonoscopy. During this test, a long, thin, flexible tube with a tiny camera (colonoscope) is used to examine your entire colon and rectum.  Prostate cancer screening. Recommendations will vary depending on your family history and other risks.  Hepatitis C blood test.  Hepatitis B blood test.  Sexually transmitted disease (STD) testing.  Diabetes screening. This is done by checking your blood sugar (glucose) after you have not eaten for a while (fasting). You may have this done every 1-3 years. Discuss your test results, treatment options, and if necessary, the need for more tests with your health care provider. Vaccines Your health care provider may recommend certain vaccines, such as:  Influenza vaccine. This is recommended every year.  Tetanus, diphtheria, and acellular pertussis (Tdap, Td) vaccine. You may need a Td booster every 10 years.  Varicella vaccine. You may need this if you have not been vaccinated.  Zoster vaccine. You may need this after age 60.  Measles, mumps, and rubella (MMR) vaccine. You may need at least one dose of MMR if you were born in 1957 or later. You may also need a second dose.  Pneumococcal 13-valent conjugate (PCV13) vaccine. You may need this if you have certain conditions and have not been vaccinated.  Pneumococcal polysaccharide (PPSV23) vaccine.   and rubella (MMR) vaccine. You may need at least one dose of MMR if you were born in 1957 or later. You may also need a second dose.   Pneumococcal 13-valent conjugate (PCV13) vaccine. You may need this if you have certain conditions and have not been vaccinated.   Pneumococcal polysaccharide (PPSV23) vaccine. You may need one or two doses if you smoke cigarettes or if you have certain conditions.   Meningococcal vaccine. You may need this if you have certain conditions.   Hepatitis A vaccine. You may need this if you have certain conditions or if you travel or work in  places where you may be exposed to hepatitis A.   Hepatitis B vaccine. You may need this if you have certain conditions or if you travel or work in places where you may be exposed to hepatitis B.   Haemophilus influenzae type b (Hib) vaccine. You may need this if you have certain risk factors.  Talk to your health care provider about which screenings and vaccines you need and how often you need them.  This information is not intended to replace advice given to you by your health care provider. Make sure you discuss any questions you have with your health care provider.  Document Released: 09/08/2015 Document Revised: 10/02/2017 Document Reviewed: 06/13/2015  Elsevier Interactive Patient Education  2019 [employer]

## 2019-02-09 NOTE — Progress Notes (Addendum)
Name: BRANDI ARMATO   MRN: [id number]    DOB: 07/04/1966   Date:02/09/2019       Progress Note  Subjective  Chief Complaint  Chief Complaint  Patient presents with  . Annual Exam    HPI  Patient presents for annual CPE. Does report dentures recently resized and he has a mouth sore from the new fit.  We will send him some lidocaine for this.  USPSTF grade A and B recommendations:  Diet: Struggles with food affordability - has been working with CCM Social worker on this and housing.  Is currently staying with a friend.  Exercise: Unable to exercise due to chronic pain.   Depression: phq 9 is negative Depression screen [facility] 2/9 02/09/2019 01/04/2019 12/22/2018 12/17/2018 10/05/2018  Decreased Interest 0 1 1 1 3   Down, Depressed, Hopeless 0 1 1 2 3   PHQ - 2 Score 0 2 2 3 6   Altered sleeping 0 2 2 2 3   Tired, decreased energy 0 1 1 2 3   Change in appetite 0 1 1 2 3   Feeling bad or failure about yourself  0 1 2 2 3   Trouble concentrating 0 1 1 2 3   Moving slowly or fidgety/restless 0 0 0 1 3  Suicidal thoughts 0 0 0 0 -  PHQ-9 Score 0 8 9 14 24   Difficult doing work/chores Not difficult at all Very difficult Very difficult Very difficult -    Hypertension:  BP Readings from Last 3 Encounters:  02/09/19 132/88  01/04/19 138/72  12/17/18 112/72    Obesity: Wt Readings from Last 3 Encounters:  02/09/19 125 lb 4.8 oz (56.8 kg)  01/04/19 129 lb 9.6 oz (58.8 kg)  12/17/18 129 lb 3.2 oz (58.6 kg)   BMI Readings from Last 3 Encounters:  02/09/19 20.85 kg/m  01/04/19 20.92 kg/m  12/17/18 20.85 kg/m     Lipids:  Lab Results  Component Value Date   CHOL 113 10/05/2018   Lab Results  Component Value Date   HDL 70 10/05/2018   Lab Results  Component Value Date   LDLCALC 28 10/05/2018   Lab Results  Component Value Date   TRIG 66 10/05/2018   Lab Results  Component Value Date   CHOLHDL 1.6 10/05/2018   No results found for: LDLDIRECT Glucose:  Glucose, Bld   Date Value Ref Range Status  11/10/2018 93 70 - 99 mg/dL Final  10/05/2018 80 65 - 99 mg/dL Final    Comment:    .            Fasting reference interval .   07/11/2018 95 70 - 99 mg/dL Final      [facility] Visit from 02/09/2019 [facility]  AUDIT-C Score  2    Last use for Crack was last week.  Last ETOH use was also last week. No injectable drugs.   Single STD testing and prevention (HIV/chl/gon/syphilis): HIV negative 09/2018.  No new partners since February.   Hep C: Negative February 2020  Skin cancer: No concerning lesions Colorectal cancer: Denies family or personal history of colorectal cancer, no changes in BM's - no blood in stool, dark and tarry stool, mucus in stool, or constipation/diarrhea.  Colonoscopy is UTD - due in 2027. Prostate cancer: No family history.  PSA in 09/2018 was WNL.  Lab Results  Component Value Date   PSA 0.7 10/05/2018   IPSS Questionnaire (AUA-7): Over the past month.   1)  How often have  you had a sensation of not emptying your bladder completely after you finish urinating?  0 - Not at all  2)  How often have you had to urinate again less than two hours after you finished urinating? 0 - Not at all  3)  How often have you found you stopped and started again several times when you urinated?  0 - Not at all  4) How difficult have you found it to postpone urination?  0 - Not at all  5) How often have you had a weak urinary stream?  0 - Not at all  6) How often have you had to push or strain to begin urination?  0 - Not at all  7) How many times did you most typically get up to urinate from the time you went to bed until the time you got up in the morning?  1 time  Total score:  0-7 mildly symptomatic   8-19 moderately symptomatic   20-35 severely symptomatic  Score of 1  Lung cancer:  Current Smoker - smoking only a few in a week. Low Dose CT Chest recommended if Age 6-80 years, 30 pack-year currently smoking OR have quit  w/in 15years. Patient does qualify. Has smoked since he was 53yo, was smoking 1ppd for most of the years he was smoking - about 40 pack year history. AAA:N/A The USPSTF recommends one-time screening with ultrasonography in men ages 38 to 28 years who have ever smoked ECG: EKG on file, no chest pain, shortness of breath or palpitations.  Advanced Care Planning: A voluntary discussion about advance care planning including the explanation and discussion of advance directives.  Discussed health care proxy and Living will, and the patient was not able to identify a health care proxy.  Patient does not have a living will at present time. If patient does have living will, I have requested they bring this to the clinic to be scanned in to their chart.  Patient Active Problem List   Diagnosis Date Noted  . Essential hypertension 01/04/2019  . Crack cocaine use 12/17/2018  . Sebaceous cyst 12/17/2018  . Nonintractable episodic headache 10/05/2018  . Frequency of urination and polyuria 10/05/2018  . Encounter for tobacco use cessation counseling 10/05/2018  . Severe episode of recurrent major depressive disorder, without psychotic features ([place]) 10/05/2018  . Irritability and anger 10/05/2018  . Homelessness 09/19/2018  . Chronic neck pain 09/19/2018  . Chronic bilateral back pain 09/19/2018  . Right arm pain 08/14/2018    Past Surgical History:  Procedure Laterality Date  . COLONOSCOPY WITH PROPOFOL N/A 10/15/2018   Procedure: COLONOSCOPY WITH PROPOFOL;  Surgeon: Jonathon Bellows, MD;  Location: [facility] ENDOSCOPY;  Service: Gastroenterology;  Laterality: N/A;  . MULTIPLE TOOTH EXTRACTIONS    . NO PAST SURGERIES      History reviewed. No pertinent family history.  Social History   Socioeconomic History  . Marital status: Single    Spouse name: Not on file  . Number of children: 2  . Years of education: 61  . Highest education level: High school graduate  Occupational History  . Occupation:  unemployed  Social Needs  . Financial resource strain: Very hard  . Food insecurity    Worry: Often true    Inability: Often true  . Transportation needs    Medical: Yes    Non-medical: Yes  Tobacco Use  . Smoking status: Current Some Day Smoker    Packs/day: 0.25    Types: Cigarettes  .  Smokeless tobacco: Never Used  Substance and Sexual Activity  . Alcohol use: Not Currently    Comment: rarely   . Drug use: Yes    Frequency: 2.0 times per week    Types: Marijuana, Cocaine  . Sexual activity: Not Currently    Partners: Female  Lifestyle  . Physical activity    Days per week: 0 days    Minutes per session: 0 min  . Stress: Very much  Relationships  . Social Herbalist on phone: Never    Gets together: Never    Attends religious service: 1 to 4 times per year    Active member of club or organization: No    Attends meetings of clubs or organizations: Never    Relationship status: Never married  . Intimate partner violence    Fear of current or ex partner: No    Emotionally abused: No    Physically abused: No    Forced sexual activity: No  Other Topics Concern  . Not on file  Social History Narrative  . Not on file     Current Outpatient Medications:  .  busPIRone (BUSPAR) 5 MG tablet, Take 1 tablet (5 mg total) by mouth daily., Disp: 90 tablet, Rfl: 1 .  DULoxetine (CYMBALTA) 30 MG capsule, Take 1 capsule (30 mg total) by mouth daily., Disp: 90 capsule, Rfl: 0 .  hydrochlorothiazide (HYDRODIURIL) 12.5 MG tablet, Take 1 tablet (12.5 mg total) by mouth daily., Disp: 90 tablet, Rfl: 1  Allergies  Allergen Reactions  . Septra [Sulfamethoxazole-Trimethoprim] Other (See Comments)    Renal Failure  . Lactose Nausea And Vomiting     ROS  Constitutional: Negative for fever or weight change.  Respiratory: Negative for cough and shortness of breath.   Cardiovascular: Negative for chest pain or palpitations.  Gastrointestinal: Negative for abdominal pain,  no bowel changes.  Musculoskeletal: Negative for gait problem or joint swelling.  Skin: Negative for rash.  Neurological: Negative for dizziness or headache.  No other specific complaints in a complete review of systems (except as listed in HPI above).  Objective  Vitals:   02/09/19 1050  BP: 132/88  Pulse: 64  Resp: 14  Temp: 98.2 F (36.8 C)  TempSrc: Oral  SpO2: 96%  Weight: 125 lb 4.8 oz (56.8 kg)  Height: 5\' 5"  (1.651 m)    Body mass index is 20.85 kg/m.  Physical Exam Constitutional: Patient appears well-developed and well-nourished. No distress.  HENT: Head: Normocephalic and atraumatic. Ears: B TMs ok, no erythema or effusion; Nose: Nose normal. Mouth/Throat: Oropharynx is clear and moist. No oropharyngeal exudate.  Eyes: Conjunctivae and EOM are normal. Pupils are equal, round, and reactive to light. No scleral icterus.  Neck: Normal range of motion. Neck supple. No JVD present. No thyromegaly present.  Cardiovascular: Normal rate, regular rhythm and normal heart sounds.  No murmur heard. No BLE edema. Pulmonary/Chest: Effort normal and breath sounds normal. No respiratory distress. Abdominal: Soft. Bowel sounds are normal, no distension. There is no tenderness. no masses MALE GENITALIA: Deferred RECTAL: Deferred Musculoskeletal: Normal range of motion, no joint effusions. No gross deformities Neurological: he is alert and oriented to person, place, and time. No cranial nerve deficit. Coordination, balance, strength, speech and gait are normal.  Skin: Skin is warm and dry. No rash noted. No erythema.  Psychiatric: Patient has a normal mood and affect. behavior is normal. Judgment and thought content normal.  Recent Results (from the past 2160 hour(s))  Urine Drug Screen, Qualitative ([facility] only)     Status: Abnormal   Collection Time: 11/18/18  6:20 AM  Result Value Ref Range   Tricyclic, Ur Screen NONE DETECTED NONE DETECTED   Amphetamines, Ur Screen NONE DETECTED  NONE DETECTED   MDMA (Ecstasy)Ur Screen NONE DETECTED NONE DETECTED   Cocaine Metabolite,Ur [place] POSITIVE (A) NONE DETECTED   Opiate, Ur Screen NONE DETECTED NONE DETECTED   Phencyclidine (PCP) Ur S NONE DETECTED NONE DETECTED   Cannabinoid 50 Ng, Ur [place] NONE DETECTED NONE DETECTED   Barbiturates, Ur Screen NONE DETECTED NONE DETECTED   Benzodiazepine, Ur Scrn NONE DETECTED NONE DETECTED   Methadone Scn, Ur NONE DETECTED NONE DETECTED    Comment: (NOTE) Tricyclics + metabolites, urine    Cutoff 1000 ng/mL Amphetamines + metabolites, urine  Cutoff 1000 ng/mL MDMA (Ecstasy), urine              Cutoff 500 ng/mL Cocaine Metabolite, urine          Cutoff 300 ng/mL Opiate + metabolites, urine        Cutoff 300 ng/mL Phencyclidine (PCP), urine         Cutoff 25 ng/mL Cannabinoid, urine                 Cutoff 50 ng/mL Barbiturates + metabolites, urine  Cutoff 200 ng/mL Benzodiazepine, urine              Cutoff 200 ng/mL Methadone, urine                   Cutoff 300 ng/mL The urine drug screen provides only a preliminary, unconfirmed analytical test result and should not be used for non-medical purposes. Clinical consideration and professional judgment should be applied to any positive drug screen result due to possible interfering substances. A more specific alternate chemical method must be used in order to obtain a confirmed analytical result. Gas chromatography / mass spectrometry (GC/MS) is the preferred confirmat ory method. Performed at [facility], [address] [place], [place] [address]   ABO/Rh     Status: None   Collection Time: 11/18/18  6:39 AM  Result Value Ref Range   ABO/RH(D)      O POS Performed at [facility], [place], [place] [address]      PHQ2/9: Depression screen [facility] 2/9 02/09/2019 01/04/2019 12/22/2018 12/17/2018 10/05/2018  Decreased Interest 0 1 1 1 3   Down, Depressed, Hopeless 0 1 1 2 3   PHQ - 2 Score 0 2 2 3 6   Altered  sleeping 0 2 2 2 3   Tired, decreased energy 0 1 1 2 3   Change in appetite 0 1 1 2 3   Feeling bad or failure about yourself  0 1 2 2 3   Trouble concentrating 0 1 1 2 3   Moving slowly or fidgety/restless 0 0 0 1 3  Suicidal thoughts 0 0 0 0 -  PHQ-9 Score 0 8 9 14 24   Difficult doing work/chores Not difficult at all Very difficult Very difficult Very difficult -   Fall Risk: Fall Risk  02/09/2019 01/04/2019 12/22/2018 12/17/2018 12/17/2018  Falls in the past year? 0 0 0 0 0  Number falls in past yr: 0 0 - 0 0  Injury with Fall? 0 0 - 0 0  Risk for fall due to : - - - - -  Follow up - - - - -    Functional Status Survey: Is the  patient deaf or have difficulty hearing?: No Does the patient have difficulty seeing, even when wearing glasses/contacts?: No Does the patient have difficulty concentrating, remembering, or making decisions?: No Does the patient have difficulty walking or climbing stairs?: No Does the patient have difficulty dressing or bathing?: No Does the patient have difficulty doing errands alone such as visiting a doctor's office or shopping?: No   Assessment & Plan  1. Annual physical exam -Prostate cancer screening and PSA options (with potential risks and benefits of testing vs not testing) were discussed along with recent recs/guidelines. -USPSTF grade A and B recommendations reviewed with patient; age-appropriate recommendations, preventive care, screening tests, etc discussed and encouraged; healthy living encouraged; see AVS for patient education given to patient -Discussed importance of 150 minutes of physical activity weekly, eat two servings of fish weekly, eat one serving of tree nuts ( cashews, pistachios, pecans, almonds.[name]) every other day, eat 6 servings of fruit/vegetables daily and drink plenty of water and avoid sweet beverages.   2. Encounter for screening for malignant neoplasm of respiratory organs - CT CHEST LUNG CA SCREEN LOW DOSE W/O CM; Future  3.  Tobacco abuse counseling - CT CHEST LUNG CA SCREEN LOW DOSE W/O CM; Future

## 2019-02-09 NOTE — Addendum Note (Signed)
Addended by: [name] on: 02/09/2019 11:29 AM   Modules accepted: Orders

## 2019-02-10 ENCOUNTER — Telehealth: Payer: Self-pay | Admitting: *Deleted

## 2019-02-10 DIAGNOSIS — Z87891 Personal history of nicotine dependence: Secondary | ICD-10-CM

## 2019-02-10 DIAGNOSIS — Z122 Encounter for screening for malignant neoplasm of respiratory organs: Secondary | ICD-10-CM

## 2019-02-10 NOTE — Telephone Encounter (Signed)
Received referral for initial lung cancer screening scan. Contacted patient and obtained smoking history,(current, 40 pack year) as well as answering questions related to screening process. Patient denies signs of lung cancer such as weight loss or hemoptysis. Patient denies comorbidity that would prevent curative treatment if lung cancer were found. Patient is scheduled for shared decision making visit and CT scan on 02/18/19 at 130pm.

## 2019-02-11 ENCOUNTER — Ambulatory Visit: Payer: Self-pay | Admitting: *Deleted

## 2019-02-11 DIAGNOSIS — Z5900 Homelessness unspecified: Secondary | ICD-10-CM

## 2019-02-11 DIAGNOSIS — F1490 Cocaine use, unspecified, uncomplicated: Secondary | ICD-10-CM

## 2019-02-11 NOTE — Patient Instructions (Signed)
Thank you allowing the Chronic Care Management Team to be a part of your care! It was a pleasure speaking with you today!  1. Please contact the [name] and [name] for outpatient substance abuse treatment  within the next week. 2. Please call this social worker with any additional community resource needs.      CCM (Chronic Care Management) Team   Trish Fountain RN, BSN Nurse Care Coordinator  [phone]  Ruben Reason PharmD  Clinical Pharmacist  [phone]   Elliot Gurney, LCSW Clinical Social Worker [phone]  Goals Addressed            This Visit's Progress   . "I need to live  in a better environment" (pt-stated)       Current Barriers:  . Financial constraints . Limited social support . Housing barriers . Mental Health Concerns  . Substance abuse issues - crack cocaine use  Clinical Social Work Clinical Goal(s):  [name] Over the next 30 days, client will work with SW to address concerns related to housing and substance abuse treatment  Interventions: . Followed up on community resources provided to patient for the  [place] and [place] outpatient substance abuse treatment [phone] . Confirmed that patient has contacted the resources, left a message but has not received a return call. . Confirmed that patient continues in motivation to gain his independence and move out on his own further confirming that he now lives with a cousin of his in a healthier environment . Patient goal to remain substance free also revisted . Advised patient to contact [name] and [name] for outpatient substance abuse treatment again within the week . Discussed expectation that he will have called the community resources provided within the next week, if no return call this social worker will assist. . Discussed plans with patient for ongoing care management follow up and provided patient with direct contact information for care  management team   Patient Self Care Activities:  . Self administers medications as prescribed . Attends all scheduled provider appointments . Calls provider office for new concerns or questions  Please see past updates related to this goal by clicking on the "Past Updates" button in the selected goal          The patient verbalized understanding of instructions provided today and declined a print copy of patient instruction materials.   The care management team will reach out to the patient again over the next 14 days.

## 2019-02-11 NOTE — Chronic Care Management (AMB) (Addendum)
   Care Management    Clinical Social Work Follow Up Note  02/11/2019 Name: James Padilla MRN: [id number] DOB: 03/28/1966  James Padilla is a 53 y.o. year old male who is a primary care patient of [name], FNP. The CCM team was consulted for assistance with [employer].   Review of patient status, including review of consultants reports, other relevant assessments, and collaboration with appropriate care team members and the patient's provider was performed as part of comprehensive patient evaluation and provision of chronic care management services.     Goals Addressed            This Visit's Progress   . "I need to live  in a better environment" (pt-stated)       Phone call to patient today who reported increase frustration as his stimulus check went to pay back child support. He did report that he still receives food stamps and was able to follow up with the dentist having 5 teeth pulled.  Current Barriers:  . Financial constraints . Limited social support . Housing barriers . Mental Health Concerns  . Substance abuse issues - crack cocaine use  Clinical Social Work Clinical Goal(s):  [name] Over the next 30 days, client will work with SW to address concerns related to housing and substance abuse treatment  Interventions: . Followed up on community resources provided to patient for the  [place] and [place] outpatient substance abuse treatment [phone] . Confirmed that patient has contacted the resources, left a message but has not received a return call. . Confirmed that patient continues in motivation to gain his independence and move out on his own further confirming that he now lives with a cousin of his in a healthier environment . Patient goal to remain substance free also revisted . Advised patient to contact [name] for outpatient substance abuse treatment again within the week . Discussed expectation that he  will have called the community resources provided within the next week, if no return call this social worker will assist. . Discussed plans with patient for ongoing care management follow up and provided patient with direct contact information for care management team   Patient Self Care Activities:  . Self administers medications as prescribed . Attends all scheduled provider appointments . Calls provider office for new concerns or questions  Please see past updates related to this goal by clicking on the "Past Updates" button in the selected goal          Follow Up Plan: SW will follow up with patient by phone over the next week   [place], [name]  [facility] Care Management [phone]

## 2019-02-17 ENCOUNTER — Encounter: Payer: Self-pay | Admitting: Nurse Practitioner

## 2019-02-18 ENCOUNTER — Ambulatory Visit: Payer: Self-pay | Admitting: *Deleted

## 2019-02-18 ENCOUNTER — Ambulatory Visit
Admission: RE | Admit: 2019-02-18 | Discharge: 2019-02-18 | Disposition: A | Discharge status: Home / Self Care | Payer: Medicaid Other | Source: Ambulatory Visit | Attending: Oncology | Admitting: Oncology

## 2019-02-18 ENCOUNTER — Other Ambulatory Visit: Payer: Self-pay

## 2019-02-18 ENCOUNTER — Inpatient Hospital Stay: Payer: Medicaid Other | Attending: Nurse Practitioner | Admitting: Nurse Practitioner

## 2019-02-18 DIAGNOSIS — Z5900 Homelessness unspecified: Secondary | ICD-10-CM

## 2019-02-18 DIAGNOSIS — Z122 Encounter for screening for malignant neoplasm of respiratory organs: Secondary | ICD-10-CM

## 2019-02-18 DIAGNOSIS — Z87891 Personal history of nicotine dependence: Secondary | ICD-10-CM

## 2019-02-18 DIAGNOSIS — F1490 Cocaine use, unspecified, uncomplicated: Secondary | ICD-10-CM

## 2019-02-19 ENCOUNTER — Telehealth: Payer: Self-pay | Admitting: *Deleted

## 2019-02-19 NOTE — Patient Instructions (Signed)
Thank you allowing the Chronic Care Management Team to be a part of your care! It was a pleasure speaking with you today!  1. Please call this social worker if you have any questions or concerns regarding your community resource needs  CCM (Chronic Care Management) Team   Trish Fountain RN, BSN Nurse Care Coordinator  [phone]  Ruben Reason PharmD  Clinical Pharmacist  [phone]   Elliot Gurney, [place] Social Worker [phone]  Goals Addressed            This Visit's Progress   . "I need to live  in a better environment" (pt-stated)       Current Barriers:  . Financial constraints . Limited social support . Housing barriers . Mental Health Concerns  . Substance abuse issues - crack cocaine use  Clinical Social Work Clinical Goal(s):  [name] Over the next 30 days, client will work with SW to address concerns related to housing and substance abuse treatment  Interventions: . Followed up on community resources provided to patient for the  [place] and [place] outpatient substance abuse treatment [phone] . Confirmed that patient has contacted the resources, left a message but has not received a return call. . Confirmed that patient continues in motivation to gain his independence and move out on his own further confirming that he now lives with a cousin. Despite the healthier environment he would like his own place . Confirmed that patient is now declining substance abuse counseling stating that he is cutting down on use on his own . Discussed plan to call the [name] on his behalf to discuss housing options as patient currently has no income. . Discussed the limits of identifying housing with no income . Confirmed that patient will have surgery on 03/10/19 and anticipates he may need some in home care to assist with his recovery . Discussed plans with patient for ongoing care management follow up and provided patient  with direct contact information for care management team   Patient Self Care Activities:  . Self administers medications as prescribed . Attends all scheduled provider appointments . Calls provider office for new concerns or questions  Please see past updates related to this goal by clicking on the "Past Updates" button in the selected goal          The patient verbalized understanding of instructions provided today and declined a print copy of patient instruction materials.   The care management team will reach out to the patient again over the next 14 days.

## 2019-02-19 NOTE — Telephone Encounter (Signed)
Had very late arrival for lung screening and was not able to have shared decision making visit or Ct. Voicemail left for patient in attempt to reschedule.

## 2019-02-19 NOTE — Chronic Care Management (AMB) (Signed)
   Care Management    Clinical Social Work Follow Up Note  02/19/2019 Name: James Padilla MRN: [id number] DOB: [birth year]  James Padilla is a 53 y.o. year old male who is a primary care patient of [name], FNP. The CCM team was consulted for assistance with [employer].   Review of patient status, including review of consultants reports, other relevant assessments, and collaboration with appropriate care team members and the patient's provider was performed as part of comprehensive patient evaluation and provision of chronic care management services.     Goals Addressed            This Visit's Progress   . "I need to live  in a better environment" (pt-stated)       Current Barriers:  . Financial constraints . Limited social support . Housing barriers . Mental Health Concerns  . Substance abuse issues - crack cocaine use  Clinical Social Work Clinical Goal(s):  [name] Over the next 30 days, client will work with SW to address concerns related to housing and substance abuse treatment  Interventions: . Followed up on community resources provided to patient for the  [facility] substance abuse treatment [phone] . Confirmed that patient has contacted the resources, left a message but has not received a return call. . Confirmed that patient continues in motivation to gain his independence and move out on his own further confirming that he now lives with a cousin. Despite the healthier environment he would like his own place . Confirmed that patient is now declining substance abuse counseling stating that he is cutting down on use on his own . Discussed plan to call the [name] on his behalf to discuss housing options as patient currently has no income. . Discussed the limits of identifying housing with no income . Confirmed that patient will have surgery on 03/10/19 and anticipates he may need some in home care to  assist with his recovery . Discussed plans with patient for ongoing care management follow up and provided patient with direct contact information for care management team   Patient Self Care Activities:  . Self administers medications as prescribed . Attends all scheduled provider appointments . Calls provider office for new concerns or questions  Please see past updates related to this goal by clicking on the "Past Updates" button in the selected goal          Follow Up Plan: SW will follow up with patient by phone over the next 14 days   [place], [place] Worker  [facility] [phone]

## 2019-02-22 ENCOUNTER — Ambulatory Visit: Payer: Self-pay | Admitting: *Deleted

## 2019-02-22 DIAGNOSIS — F1490 Cocaine use, unspecified, uncomplicated: Secondary | ICD-10-CM

## 2019-02-22 DIAGNOSIS — Z5900 Homelessness unspecified: Secondary | ICD-10-CM

## 2019-02-22 NOTE — Chronic Care Management (AMB) (Signed)
   Care Management    Clinical Social Work Follow Up Note  02/22/2019 Name: ABOUBACAR MATSUO MRN: [id number] DOB: Jul 07, 1966  [name] is a 53 y.o. year old male who is a primary care patient of [name], FNP. The CCM team was consulted for assistance with [employer] for housing.   Review of patient status, including review of consultants reports, other relevant assessments, and collaboration with appropriate care team members and the patient's provider was performed as part of comprehensive patient evaluation and provision of chronic care management services.     Goals Addressed            This Visit's Progress   . "I need to live  in a better environment" (pt-stated)       Current Barriers:  . Financial constraints . Limited social support . Housing barriers . Mental Health Concerns  . Substance abuse issues - crack cocaine use  Clinical Social Work Clinical Goal(s):  [name] Over the next 30 days, client will work with SW to address concerns related to housing and substance abuse treatment  Interventions: . Followed up on community resources provided to patient for the  [name], spoke with Georgia Lopes [phone] . Discussed need for patient to have a negative COVID test before entering the program . Confirmed that the program is a faith based substance abuse program.  . Confirmed that patient would be allowed to reside there rent free for the first 30 days but would be expected to find a job after the first 30 days and would be responsible for paying $55.00 a week for rent . Voicemail message left for patient to return call about the above information identified.      Patient Self Care Activities:  . Self administers medications as prescribed . Attends all scheduled provider appointments . Calls provider office for new concerns or questions  Please see past updates related to this goal by clicking on the "Past Updates" button in the selected  goal          Follow Up Plan: SW will follow up with patient by phone over the next 2 weeks   [place], [place] Worker  [facility] [phone]

## 2019-02-24 ENCOUNTER — Telehealth: Payer: Self-pay | Admitting: *Deleted

## 2019-02-24 NOTE — Telephone Encounter (Signed)
Contacted in attempt to reschedule lung screening scan. Patient will call back after he inquires about getting a ride and knows his availability.

## 2019-03-01 ENCOUNTER — Encounter
Admission: RE | Admit: 2019-03-01 | Discharge: 2019-03-01 | Disposition: A | Discharge status: Home / Self Care | Payer: Medicaid Other | Source: Ambulatory Visit | Attending: Neurosurgery | Admitting: Neurosurgery

## 2019-03-01 ENCOUNTER — Other Ambulatory Visit: Payer: Self-pay

## 2019-03-01 DIAGNOSIS — Z01812 Encounter for preprocedural laboratory examination: Secondary | ICD-10-CM | POA: Insufficient documentation

## 2019-03-01 LAB — BASIC METABOLIC PANEL
Anion gap: 8 (ref 5–15)
BUN: 9 mg/dL (ref 6–20)
CO2: 26 mmol/L (ref 22–32)
Calcium: 9 mg/dL (ref 8.9–10.3)
Chloride: 103 mmol/L (ref 98–111)
Creatinine, Ser: 1.43 mg/dL — ABNORMAL HIGH (ref 0.61–1.24)
GFR calc Af Amer: >60 mL/min (ref >60)
GFR calc non Af Amer: 55 mL/min — ABNORMAL LOW (ref >60)
Glucose, Bld: 76 mg/dL (ref 70–99)
Potassium: 3.8 mmol/L (ref 3.5–5.1)
Sodium: 137 mmol/L (ref 135–145)

## 2019-03-01 LAB — CBC
HCT: 41.3 % (ref 39.0–52.0)
Hemoglobin: 14.1 g/dL (ref 13.0–17.0)
MCH: 33.6 pg (ref 26.0–34.0)
MCHC: 34.1 g/dL (ref 30.0–36.0)
MCV: 98.3 fL (ref 80.0–100.0)
Platelets: 202 10*3/uL (ref 150–400)
RBC: 4.2 MIL/uL — ABNORMAL LOW (ref 4.22–5.81)
RDW: 12.5 % (ref 11.5–15.5)
WBC: 3.9 10*3/uL — ABNORMAL LOW (ref 4.0–10.5)
nRBC: 0 % (ref 0.0–0.2)

## 2019-03-01 LAB — TYPE AND SCREEN
ABO/RH(D): O POS
Antibody Screen: NEGATIVE

## 2019-03-01 LAB — URINALYSIS, ROUTINE W REFLEX MICROSCOPIC
Bacteria, UA: NONE SEEN
Bilirubin Urine: NEGATIVE
Glucose, UA: NEGATIVE mg/dL
Hgb urine dipstick: NEGATIVE
Ketones, ur: NEGATIVE mg/dL
Nitrite: NEGATIVE
Protein, ur: NEGATIVE mg/dL
Specific Gravity, Urine: 1.006 (ref 1.005–1.030)
pH: 5 (ref 5.0–8.0)

## 2019-03-01 LAB — APTT: aPTT: 30 seconds (ref 24–36)

## 2019-03-01 LAB — SURGICAL PCR SCREEN
MRSA, PCR: NEGATIVE
Staphylococcus aureus: NEGATIVE

## 2019-03-01 LAB — PROTIME-INR
INR: 1 (ref 0.8–1.2)
Prothrombin Time: 13.4 seconds (ref 11.4–15.2)

## 2019-03-01 NOTE — Patient Instructions (Signed)
Your procedure is scheduled on: Wed 7/15 Report to Day Surgery. To find out your arrival time please call [phone] between 1PM - 3PM on Tues 7/14.  Remember: Instructions that are not followed completely may result in serious medical risk,  up to and including death, or upon the discretion of your surgeon and anesthesiologist your  surgery may need to be rescheduled.     _X__ 1. Do not eat food after midnight the night before your procedure.                 No gum chewing or hard candies. You may drink clear liquids up to 2 hours                 before you are scheduled to arrive for your surgery- DO not drink clear                 liquids within 2 hours of the start of your surgery.                 Clear Liquids include:  water, apple juice without pulp, clear carbohydrate                 drink such as Clearfast of Gatorade, Black Coffee or Tea (Do not add                 anything to coffee or tea).  __X__2.  On the morning of surgery brush your teeth with toothpaste and water, you                may rinse your mouth with mouthwash if you wish.  Do not swallow any toothpaste of mouthwash.     _X__ 3.  No Alcohol for 24 hours before or after surgery.   _X__ 4.  Do Not Smoke or use e-cigarettes For 24 Hours Prior to Your Surgery.                 Do not use any chewable tobacco products for at least 6 hours prior to                 surgery.  ____  5.  Bring all medications with you on the day of surgery if instructed.   ____  6.  Notify your doctor if there is any change in your medical condition      (cold, fever, infections).     Do not wear jewelry, make-up, hairpins, clips or nail polish. Do not wear lotions, powders, or perfumes. You may wear deodorant. Do not shave 48 hours prior to surgery. Men may shave face and neck. Do not bring valuables to the hospital.    [facility] is not responsible for any belongings or valuables.  Contacts, dentures or  bridgework may not be worn into surgery. Leave your suitcase in the car. After surgery it may be brought to your room. For patients admitted to the hospital, discharge time is determined by your treatment team.   Patients discharged the day of surgery will not be allowed to drive home.   Please read over the following fact sheets that you were given:    __x__ Take these medicines the morning of surgery with A SIP OF WATER:    1. busPIRone (BUSPAR) 5 MG tablet  2. DULoxetine (CYMBALTA) 30 MG capsule  3.   4.  5.  6.  ____ Fleet Enema (as directed)   _x___ Use CHG Soap as directed  ____ Use inhalers on the day of surgery  ____ Stop metformin 2 days prior to surgery    ____ Take 1/2 of usual insulin dose the night before surgery. No insulin the morning          of surgery.   ____ Stop Coumadin/Plavix/aspirin on   __x__ Stop Anti-inflammatories on Wed. 7/8   ____ Stop supplements until after surgery.    ____ Bring C-Pap to the hospital.

## 2019-03-04 ENCOUNTER — Telehealth: Payer: Self-pay

## 2019-03-04 ENCOUNTER — Ambulatory Visit: Payer: Self-pay | Admitting: *Deleted

## 2019-03-04 NOTE — Chronic Care Management (AMB) (Signed)
  Care Management   Unsuccessful Call Note 03/04/2019 Name: James Padilla MRN: [id number] DOB: 1966-02-29   Patient  is a 53 year old male who sees Raelyn Ensign, [place]  for primary care. Raelyn Ensign, FNP asked the CCM team to consult the patient for housing resources.     This social worker was unable to reach patient via telephone today to provide him with housing resources identified. I have left HIPAA compliant voicemail asking patient to return my call. (unsuccessful outreach #2).   Plan: Will follow-up within 7 business days via telephone.     Elliot Gurney, [place] Administrator, [facility] [phone]

## 2019-03-05 ENCOUNTER — Other Ambulatory Visit
Admission: RE | Admit: 2019-03-05 | Discharge: 2019-03-05 | Disposition: A | Discharge status: Home / Self Care | Payer: Medicaid Other | Source: Ambulatory Visit | Attending: Neurosurgery | Admitting: Neurosurgery

## 2019-03-05 ENCOUNTER — Other Ambulatory Visit: Payer: Self-pay

## 2019-03-05 DIAGNOSIS — Z1159 Encounter for screening for other viral diseases: Secondary | ICD-10-CM | POA: Diagnosis not present

## 2019-03-05 DIAGNOSIS — Z01812 Encounter for preprocedural laboratory examination: Secondary | ICD-10-CM | POA: Insufficient documentation

## 2019-03-05 LAB — SARS CORONAVIRUS 2 (TAT 6-24 HRS): SARS Coronavirus 2: NEGATIVE

## 2019-03-08 ENCOUNTER — Ambulatory Visit: Payer: Self-pay | Admitting: *Deleted

## 2019-03-08 DIAGNOSIS — Z5900 Homelessness unspecified: Secondary | ICD-10-CM

## 2019-03-08 DIAGNOSIS — F1490 Cocaine use, unspecified, uncomplicated: Secondary | ICD-10-CM

## 2019-03-09 NOTE — Chronic Care Management (AMB) (Addendum)
   Care Management    Clinical Social Work Follow Up Note  03/09/2019 Name: James Padilla MRN: [id number] DOB: Oct 01, 1965  Late Entry James Padilla is a 53 y.o. year old male who is a primary care patient of [name], FNP. The CCM team was consulted for assistance with [employer] for housing.     Review of patient status, including review of consultants reports, other relevant assessments, and collaboration with appropriate care team members and the patient's provider was performed as part of comprehensive patient evaluation and provision of chronic care management services.     Goals Addressed            This Visit's Progress   . "I need to live  in a better environment" (pt-stated)       Current Barriers:  . Financial constraints . Limited social support . Housing barriers . Mental Health Concerns  . Substance abuse issues - crack cocaine use  Clinical Social Work Clinical Goal(s):  [name] Over the next 30 days, client will work with SW to address concerns related to housing and substance abuse treatment  Interventions: . Provided patient with community resource for the  [name], contact information for Georgia Lopes [phone] provided . Discussed need for patient to have a negative COVID test before entering the program . Confirmed that the program is a faith based substance abuse program.  . Confirmed that patient would be allowed to reside there rent free for the first 30 days but would be expected to find a job after the first 30 days and would be responsible for paying $55.00 a week for rent   Patient Self Care Activities:  . Self administers medications as prescribed . Attends all scheduled provider appointments . Calls provider office for new concerns or questions  Please see past updates related to this goal by clicking on the "Past Updates" button in the selected goal          Follow Up Plan: Client will contact the [facility] to follow up on housing and recovery options   Leanndra Pember, [facility] [phone]

## 2019-03-09 NOTE — Patient Instructions (Signed)
Thank you allowing the Chronic Care Management Team to be a part of your care! It was a pleasure speaking with you today!  1. Please call the CCM social worker with questions or concerns regarding housing resource provided.  CCM (Chronic Care Management) Team   Trish Fountain RN, BSN Nurse Care Coordinator  [phone]  Ruben Reason PharmD  Clinical Pharmacist  [phone]   Elliot Gurney, LCSW Clinical Social Worker [phone]  Goals Addressed            This Visit's Progress   . "I need to live  in a better environment" (pt-stated)       Current Barriers:  . Financial constraints . Limited social support . Housing barriers . Mental Health Concerns  . Substance abuse issues - crack cocaine use  Clinical Social Work Clinical Goal(s):  [name] Over the next 30 days, client will work with SW to address concerns related to housing and substance abuse treatment  Interventions: . Provided patient with community resource for the  [name], contact information for Georgia Lopes [phone] provided . Discussed need for patient to have a negative COVID test before entering the program . Confirmed that the program is a faith based substance abuse program.  . Confirmed that patient would be allowed to reside there rent free for the first 30 days but would be expected to find a job after the first 30 days and would be responsible for paying $55.00 a week for rent   Patient Self Care Activities:  . Self administers medications as prescribed . Attends all scheduled provider appointments . Calls provider office for new concerns or questions  Please see past updates related to this goal by clicking on the "Past Updates" button in the selected goal          The patient verbalized understanding of instructions provided today and declined a print copy of patient instruction materials.   The patient will call the [facility]* as advised to follow up  on housing and recovery options.

## 2019-03-10 ENCOUNTER — Encounter: Payer: Self-pay | Admitting: Anesthesiology

## 2019-03-10 ENCOUNTER — Encounter
Admission: RE | Disposition: A | Discharge status: Home / Self Care | Payer: Self-pay | Source: Home / Self Care | Attending: Neurosurgery

## 2019-03-10 ENCOUNTER — Ambulatory Visit: Payer: Self-pay | Admitting: *Deleted

## 2019-03-10 ENCOUNTER — Encounter: Payer: Self-pay | Admitting: *Deleted

## 2019-03-10 ENCOUNTER — Other Ambulatory Visit: Payer: Self-pay

## 2019-03-10 ENCOUNTER — Ambulatory Visit
Admission: RE | Admit: 2019-03-10 | Discharge: 2019-03-10 | Disposition: A | Discharge status: Home / Self Care | Payer: Medicaid Other | Attending: Neurosurgery | Admitting: Neurosurgery

## 2019-03-10 DIAGNOSIS — G959 Disease of spinal cord, unspecified: Secondary | ICD-10-CM | POA: Diagnosis not present

## 2019-03-10 DIAGNOSIS — Z5900 Homelessness unspecified: Secondary | ICD-10-CM

## 2019-03-10 DIAGNOSIS — F1490 Cocaine use, unspecified, uncomplicated: Secondary | ICD-10-CM

## 2019-03-10 DIAGNOSIS — Z539 Procedure and treatment not carried out, unspecified reason: Secondary | ICD-10-CM | POA: Insufficient documentation

## 2019-03-10 LAB — URINE DRUG SCREEN, QUALITATIVE (ARMC ONLY)
Amphetamines, Ur Screen: NOT DETECTED
Barbiturates, Ur Screen: NOT DETECTED
Benzodiazepine, Ur Scrn: NOT DETECTED
Cannabinoid 50 Ng, Ur ~~LOC~~: POSITIVE — AB
Cocaine Metabolite,Ur ~~LOC~~: POSITIVE — AB
MDMA (Ecstasy)Ur Screen: NOT DETECTED
Methadone Scn, Ur: NOT DETECTED
Opiate, Ur Screen: NOT DETECTED
Phencyclidine (PCP) Ur S: NOT DETECTED
Tricyclic, Ur Screen: NOT DETECTED

## 2019-03-10 SURGERY — ANTERIOR CERVICAL DECOMPRESSION/DISCECTOMY FUSION 3 LEVELS
Anesthesia: General

## 2019-03-10 MED ORDER — CEFAZOLIN SODIUM-DEXTROSE 2-4 GM/100ML-% IV SOLN: 2.0000 g | Freq: Once | INTRAVENOUS | Status: DC

## 2019-03-10 MED ORDER — FAMOTIDINE 20 MG PO TABS: 20.0000 mg | ORAL_TABLET | Freq: Once | ORAL | Status: DC

## 2019-03-10 MED ORDER — LACTATED RINGERS IV SOLN: INTRAVENOUS | Status: DC

## 2019-03-10 SURGICAL SUPPLY — 61 items
BAND RUBBER 3X1/6 TAN STRL (MISCELLANEOUS) ×3 IMPLANT
BLADE BOVIE TIP EXT 4 (BLADE) ×3 IMPLANT
BLADE SURG 15 STRL LF DISP TIS (BLADE) ×1 IMPLANT
BLADE SURG 15 STRL SS (BLADE) ×2
BULB RESERV EVAC DRAIN JP 100C (MISCELLANEOUS) IMPLANT
BUR NEURO DRILL SOFT 3.0X3.8M (BURR) ×3 IMPLANT
CANISTER SUCT 1200ML W/VALVE (MISCELLANEOUS) ×6 IMPLANT
CHLORAPREP W/TINT 26 (MISCELLANEOUS) ×3 IMPLANT
CLOSURE WOUND 1/2 X4 (GAUZE/BANDAGES/DRESSINGS)
COUNTER NEEDLE 20/40 LG (NEEDLE) ×3 IMPLANT
COVER LIGHT HANDLE STERIS (MISCELLANEOUS) ×6 IMPLANT
COVER WAND RF STERILE (DRAPES) ×3 IMPLANT
CRADLE LAMINECT ARM (MISCELLANEOUS) ×3 IMPLANT
CUP MEDICINE 2OZ PLAST GRAD ST (MISCELLANEOUS) ×6 IMPLANT
DERMABOND ADVANCED (GAUZE/BANDAGES/DRESSINGS) ×2
DERMABOND ADVANCED .7 DNX12 (GAUZE/BANDAGES/DRESSINGS) ×1 IMPLANT
DRAIN CHANNEL JP 10F RND 20C F (MISCELLANEOUS) IMPLANT
DRAPE C-ARM 42X72 X-RAY (DRAPES) ×6 IMPLANT
DRAPE INCISE IOBAN 66X45 STRL (DRAPES) ×3 IMPLANT
DRAPE LAPAROTOMY 77X122 PED (DRAPES) ×3 IMPLANT
DRAPE MICROSCOPE SPINE 48X150 (DRAPES) ×3 IMPLANT
DRAPE POUCH INSTRU U-SHP 10X18 (DRAPES) ×3 IMPLANT
DRAPE SHEET LG 3/4 BI-LAMINATE (DRAPES) ×3 IMPLANT
DRAPE SURG 17X11 SM STRL (DRAPES) ×12 IMPLANT
DRAPE TABLE BACK 80X90 (DRAPES) ×3 IMPLANT
ELECT CAUTERY BLADE TIP 2.5 (TIP) ×3
ELECTRODE CAUTERY BLDE TIP 2.5 (TIP) ×1 IMPLANT
FEE INTRAOP MONITOR IMPULS NCS (MISCELLANEOUS) IMPLANT
FRAME EYE SHIELD (PROTECTIVE WEAR) ×3 IMPLANT
GAUZE SPONGE 4X4 12PLY STRL (GAUZE/BANDAGES/DRESSINGS) ×3 IMPLANT
GLOVE SURG SYN 7.0 (GLOVE) ×6 IMPLANT
GLOVE SURG SYN 8.5  E (GLOVE) ×6
GLOVE SURG SYN 8.5 E (GLOVE) ×3 IMPLANT
GOWN SRG XL LVL 3 NONREINFORCE (GOWNS) ×1 IMPLANT
GOWN STRL NON-REIN TWL XL LVL3 (GOWNS) ×2
GOWN STRL REUS W/ TWL LRG LVL3 (GOWN DISPOSABLE) ×1 IMPLANT
GOWN STRL REUS W/TWL LRG LVL3 (GOWN DISPOSABLE) ×2
GRADUATE 1200CC STRL 31836 (MISCELLANEOUS) ×3 IMPLANT
INTRAOP MONITOR FEE IMPULS NCS (MISCELLANEOUS)
INTRAOP MONITOR FEE IMPULSE (MISCELLANEOUS)
IV CATH ANGIO 12GX3 LT BLUE (NEEDLE) ×3 IMPLANT
KIT TURNOVER KIT A (KITS) ×3 IMPLANT
MARKER SKIN DUAL TIP RULER LAB (MISCELLANEOUS) ×6 IMPLANT
NEEDLE HYPO 22GX1.5 SAFETY (NEEDLE) ×3 IMPLANT
NS IRRIG 1000ML POUR BTL (IV SOLUTION) ×3 IMPLANT
PACK LAMINECTOMY NEURO (CUSTOM PROCEDURE TRAY) ×3 IMPLANT
PIN CASPAR 14 (PIN) ×1 IMPLANT
PIN CASPAR 14MM (PIN) ×3
SPOGE SURGIFLO 8M (HEMOSTASIS) ×2
SPONGE KITTNER 5P (MISCELLANEOUS) ×6 IMPLANT
SPONGE SURGIFLO 8M (HEMOSTASIS) ×1 IMPLANT
STAPLER SKIN PROX 35W (STAPLE) IMPLANT
STRIP CLOSURE SKIN 1/2X4 (GAUZE/BANDAGES/DRESSINGS) IMPLANT
SUT V-LOC 90 ABS DVC 3-0 CL (SUTURE) ×3 IMPLANT
SUT VIC AB 3-0 SH 8-18 (SUTURE) ×3 IMPLANT
SYR 30ML LL (SYRINGE) ×3 IMPLANT
TAPE CLOTH 3X10 WHT NS LF (GAUZE/BANDAGES/DRESSINGS) ×3 IMPLANT
TOWEL OR 17X26 4PK STRL BLUE (TOWEL DISPOSABLE) ×12 IMPLANT
TRAY FOLEY MTR SLVR 16FR STAT (SET/KITS/TRAYS/PACK) IMPLANT
TUBING CONNECTING 10 (TUBING) ×2 IMPLANT
TUBING CONNECTING 10' (TUBING) ×1

## 2019-03-10 NOTE — Patient Instructions (Signed)
Thank you allowing the Chronic Care Management Team to be a part of your care! It was a pleasure speaking with you today!  1. Please call the [name] to discuss housing options and substance abuse treatment 2. Please call this social worker with any questions or concerns regarding your community resource needs  CCM (Chronic Care Management) Team   Trish Fountain RN, BSN Nurse Care Coordinator  [phone]  Ruben Reason PharmD  Clinical Pharmacist  [phone]   Elliot Gurney, LCSW Clinical Social Worker [phone]  Goals Addressed            This Visit's Progress   . "I need to live  in a better environment" (pt-stated)       Current Barriers:  . Financial constraints . Limited social support . Housing barriers . Mental Health Concerns  . Substance abuse issues - crack cocaine use  Clinical Social Work Clinical Goal(s):  [name] Over the next 30 days, client will work with SW to address concerns related to housing and substance abuse treatment  Interventions:  . Provided patient again with community resource for the  [name], contact information for Georgia Lopes [phone] per patient he could not have his surgery today due to positive toxicology screen for cocaine. . Patient discussed being aware of the need to make changes in his life in regards to substance use and need to reside in a stable environment . Discussed need for patient to have a negative COVID test before entering the program . Confirmed that the program is a faith based substance abuse program.  . Confirmed that patient would be allowed to reside there rent free for the first 30 days but would be expected to find a job after the first 30 days and would be responsible for paying $55.00 a week for rent   Patient Self Care Activities:  . Self administers medications as prescribed . Attends all scheduled provider appointments . Calls provider office for new concerns or  questions  Please see past updates related to this goal by clicking on the "Past Updates" button in the selected goal          The patient verbalized understanding of instructions provided today and declined a print copy of patient instruction materials.   The patient will call the [facility]* as advised to follow up on housing and substance abuse treatment options.

## 2019-03-10 NOTE — Chronic Care Management (AMB) (Signed)
  Chronic Care Management    Clinical Social Work Follow Up Note  03/10/2019 Name: James Padilla MRN: [id number] DOB: 10-17-1965  [name] is a 53 y.o. year old male who is a primary care patient of [name], FNP. The CCM team was consulted for assistance with [employer].   Review of patient status, including review of consultants reports, other relevant assessments, and collaboration with appropriate care team members and the patient's provider was performed as part of comprehensive patient evaluation and provision of chronic care management services.     Goals Addressed            This Visit's Progress   . "I need to live  in a better environment" (pt-stated)       Current Barriers:  . Financial constraints . Limited social support . Housing barriers . Mental Health Concerns  . Substance abuse issues - crack cocaine use  Clinical Social Work Clinical Goal(s):  [name] Over the next 30 days, client will work with SW to address concerns related to housing and substance abuse treatment  Interventions:  . Provided patient again with community resource for the  [name], contact information for Georgia Lopes [phone] per patient he could not have his surgery today due to positive toxicology screen for cocaine. . Patient discussed being aware of the need to make changes in his life in regards to substance use and need to reside in a stable environment . Discussed need for patient to have a negative COVID test before entering the program . Confirmed that the program is a faith based substance abuse program.  . Confirmed that patient would be allowed to reside there rent free for the first 30 days but would be expected to find a job after the first 30 days and would be responsible for paying $55.00 a week for rent   Patient Self Care Activities:  . Self administers medications as prescribed . Attends all scheduled provider appointments . Calls provider  office for new concerns or questions  Please see past updates related to this goal by clicking on the "Past Updates" button in the selected goal          Follow Up Plan: Client will contact the [facility] for housing and substance abuse treatment options   Katelee Schupp, [place] Worker  [facility] [phone]

## 2019-03-10 NOTE — OR Nursing (Signed)
Patient with a positive drug screen for cocaine. Dr. Rosey Bath and Dr. Izora Ribas notified. Case cancelled.

## 2019-03-10 NOTE — Progress Notes (Signed)
Pharmacy Antibiotic Note  James Padilla is a 53 y.o. male admitted on 03/10/2019 with surgical prophylaxis.  Pharmacy has been consulted for Cefazolin dosing.  Plan: Cefazolin 2g IV once preop     No data recorded.  No results for input(s): WBC, CREATININE, LATICACIDVEN, VANCOTROUGH, VANCOPEAK, VANCORANDOM, GENTTROUGH, GENTPEAK, GENTRANDOM, TOBRATROUGH, TOBRAPEAK, TOBRARND, AMIKACINPEAK, AMIKACINTROU, AMIKACIN in the last 168 hours.  Estimated Creatinine Clearance: 45.7 mL/min (A) (by C-G formula based on SCr of 1.43 mg/dL (H)).    Allergies  Allergen Reactions  . Septra [Sulfamethoxazole-Trimethoprim] Other (See Comments)    Renal Failure  . Lactose Nausea And Vomiting    Thank you for allowing pharmacy to be a part of this patient's care.  Paulina Fusi, PharmD, BCPS 03/10/2019 10:36 AM

## 2019-03-11 ENCOUNTER — Telehealth: Payer: Self-pay

## 2019-03-15 ENCOUNTER — Telehealth: Payer: Self-pay | Admitting: *Deleted

## 2019-03-15 NOTE — Telephone Encounter (Signed)
Received referral for low dose lung cancer screening CT scan. Message left at phone number listed in EMR for patient to call me back to facilitate scheduling scan.  

## 2019-03-25 ENCOUNTER — Ambulatory Visit: Payer: Self-pay | Admitting: *Deleted

## 2019-03-25 ENCOUNTER — Encounter: Payer: Self-pay | Admitting: *Deleted

## 2019-03-25 ENCOUNTER — Telehealth: Payer: Self-pay | Admitting: *Deleted

## 2019-03-25 NOTE — Progress Notes (Signed)
This encounter was created in error - please disregard.

## 2019-03-25 NOTE — Chronic Care Management (AMB) (Addendum)
    Care Management   Unsuccessful Call Note 03/25/2019 Name: James Padilla MRN: [id number] DOB: [birth year]  Patient  is a 53 year old male who sees Raelyn Ensign, FNP  for primary care. Raelyn Ensign, FNP  asked the CCM team to consult the patient for community resource needs.     This social worker was unable to reach patient via telephone today for follow up on housing and substance abuse treatment. I have left HIPAA compliant voicemail asking patient to return my call. (unsuccessful outreach #1).   Plan: Will follow-up within 7 business days via telephone.     Elliot Gurney, [place] Administrator, [facility] [phone]

## 2019-04-01 ENCOUNTER — Telehealth: Payer: Self-pay

## 2019-04-01 ENCOUNTER — Telehealth: Payer: Self-pay | Admitting: *Deleted

## 2019-04-01 ENCOUNTER — Ambulatory Visit: Payer: Self-pay | Admitting: *Deleted

## 2019-04-01 ENCOUNTER — Encounter: Payer: Self-pay | Admitting: *Deleted

## 2019-04-01 NOTE — Chronic Care Management (AMB) (Addendum)
   Care Management   Unsuccessful Call Note 04/01/2019 Name: James Padilla MRN: [id number] DOB: 29-Aug-1965  Patient  is a 53 year old malewho sees Raelyn Ensign, FNP  for primary care. Raelyn Ensign, FNP asked the CCM team to consult the patient for community resource needs.   This social worker was unable to reach patient via telephone today forfollow up on housing and substance abuse treatment. Ihave left HIPAA compliant voicemail asking patient to return my call. (unsuccessful outreach #2).    Plan: Will follow-up within 7 business days via telephone.      Elliot Gurney, [place] Administrator, [facility] [phone]

## 2019-04-01 NOTE — Progress Notes (Signed)
This encounter was created in error - please disregard.

## 2019-04-01 NOTE — Telephone Encounter (Signed)
Patient is rescheduled for shared decision making and lung screening scan on 04/06/19 at 2pm

## 2019-04-06 ENCOUNTER — Other Ambulatory Visit: Payer: Self-pay

## 2019-04-06 ENCOUNTER — Telehealth: Payer: Self-pay | Admitting: Family Medicine

## 2019-04-06 ENCOUNTER — Encounter: Payer: Self-pay | Admitting: Family Medicine

## 2019-04-06 ENCOUNTER — Ambulatory Visit
Admission: RE | Admit: 2019-04-06 | Discharge: 2019-04-06 | Disposition: A | Discharge status: Home / Self Care | Payer: Medicaid Other | Source: Ambulatory Visit | Attending: Oncology | Admitting: Oncology

## 2019-04-06 ENCOUNTER — Inpatient Hospital Stay: Payer: Medicaid Other | Attending: Oncology | Admitting: Oncology

## 2019-04-06 ENCOUNTER — Ambulatory Visit (INDEPENDENT_AMBULATORY_CARE_PROVIDER_SITE_OTHER): Payer: Medicaid Other | Admitting: Family Medicine

## 2019-04-06 DIAGNOSIS — M542 Cervicalgia: Secondary | ICD-10-CM | POA: Diagnosis not present

## 2019-04-06 DIAGNOSIS — M79601 Pain in right arm: Secondary | ICD-10-CM

## 2019-04-06 DIAGNOSIS — G8929 Other chronic pain: Secondary | ICD-10-CM

## 2019-04-06 DIAGNOSIS — Z716 Tobacco abuse counseling: Secondary | ICD-10-CM

## 2019-04-06 DIAGNOSIS — L723 Sebaceous cyst: Secondary | ICD-10-CM

## 2019-04-06 DIAGNOSIS — F1490 Cocaine use, unspecified, uncomplicated: Secondary | ICD-10-CM

## 2019-04-06 DIAGNOSIS — Z122 Encounter for screening for malignant neoplasm of respiratory organs: Secondary | ICD-10-CM

## 2019-04-06 DIAGNOSIS — R51 Headache: Secondary | ICD-10-CM

## 2019-04-06 DIAGNOSIS — Z87891 Personal history of nicotine dependence: Secondary | ICD-10-CM

## 2019-04-06 DIAGNOSIS — F332 Major depressive disorder, recurrent severe without psychotic features: Secondary | ICD-10-CM

## 2019-04-06 DIAGNOSIS — R519 Headache, unspecified: Secondary | ICD-10-CM

## 2019-04-06 DIAGNOSIS — M549 Dorsalgia, unspecified: Secondary | ICD-10-CM

## 2019-04-06 DIAGNOSIS — I10 Essential (primary) hypertension: Secondary | ICD-10-CM

## 2019-04-06 DIAGNOSIS — F17210 Nicotine dependence, cigarettes, uncomplicated: Secondary | ICD-10-CM

## 2019-04-06 DIAGNOSIS — R454 Irritability and anger: Secondary | ICD-10-CM

## 2019-04-06 MED ORDER — DULOXETINE HCL 30 MG PO CPEP: 30.0000 mg | ORAL_CAPSULE | Freq: Two times a day (BID) | ORAL | 1 refills | Status: DC

## 2019-04-06 NOTE — Telephone Encounter (Signed)
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  His last cholesterol reading looked really good, so I don't think that we need to start him on medication at this time (LDL was 28).  If he develops chest pain or shortness of breath he needs to go to the ER for evaluation or at the minimum let us know so that we can evaluate him further.  He needs to know that with this plaque build up, using cocaine puts him at an even high risk for heart attack or stroke, so I'd really encourage him to continue to stay clean as he has been the last few weeks.

## 2019-04-06 NOTE — Progress Notes (Signed)
Name: James Padilla   MRN: [id number]    DOB: 01-05-1966   Date:04/06/2019       Progress Note  Subjective  Chief Complaint  Chief Complaint  Patient presents with  . Follow-up    I connected with  James Padilla on 04/06/19 at 10:40 AM EDT by telephone and verified that I am speaking with the correct person using two identifiers.  I discussed the limitations, risks, security and privacy concerns of performing an evaluation and management service by telephone and the availability of in person appointments. Staff also discussed with the patient that there may be a patient responsible charge related to this service. Patient Location: Home of a friend Provider Location: Office Additional Individuals present: None  HPI  Sebaceous Cyst LEFT Shoulder: He saw Dr. Rosana Hoes and he is planning to remove the cyst when restrictions from COVID-19 are lifted.  He is doing okay - no pain or drainage recently.  Homelessness: Sleeping on his cousin's couch. He is working with IT trainer, LCSW with CCM team to obtain housing and possibly disability.  He contacted her 03/10/2019 because he was struggling to remain clean from cocaine and had +UDS prior to his back surgery, and his surgery was postponed.  RIGHT shoulder, neck, and upper back pain:Ongoing for many years, but states worsening lately.Was taking gabapentin and tramadol without relief of pain, so he stopped. He is working on obtaining disability. I advised patient of our office policy regarding opiate prescriptions, and that I will not be prescribing any additional pain medications, he verbalizes understanding.Has been seeing [facility] neurosurgery - Dr. Izora Ribas.  His surgery was canceled due to +Cocaine in UDS.  He has not called back to see if he is able to retake a test and reschedule - advised to do so as it does not appear to have been discharged from the practice.   Chronic Headaches: He has been getting headaches for many years. He does  note some worsening vision lately - was referred to an eye doctor and has new prescription; finds he gets headaches when he doesn't have his glasses on.  Unchanged and stable.  Depression: Struggling since his canceled surgery - only taking Cymbalta some days - encouraged better compliance, taking buspar daily.  He is under a lot of stress lately, is homeless, dealing with significant chronic pain, and is working on obtaining disability, working . Denies SI/HI. He does endorse being easily irritable, still smoking cigarettes; not using crack cocaine.  HTN: Doing well on HCTZ, last BMP showed stable kidney function.  BP is upper limit of normal today. Had chronic headaches, but no worsening; no chest pain, shortness of breath, or BLE edema. Stable/unchanged.  Crack Cocaine Use/Tobacco Use: Trying to quit again; last use he states was last Wednesday (02/24/2019).  Encouraged cessation and discussed health risks with him again today.  Patient Active Problem List   Diagnosis Date Noted  . Essential hypertension 01/04/2019  . Crack cocaine use 12/17/2018  . Sebaceous cyst 12/17/2018  . Nonintractable episodic headache 10/05/2018  . Frequency of urination and polyuria 10/05/2018  . Encounter for tobacco use cessation counseling 10/05/2018  . Severe episode of recurrent major depressive disorder, without psychotic features ([place]) 10/05/2018  . Irritability and anger 10/05/2018  . Homelessness 09/19/2018  . Chronic neck pain 09/19/2018  . Chronic bilateral back pain 09/19/2018  . Right arm pain 08/14/2018    Past Surgical History:  Procedure Laterality Date  . COLONOSCOPY WITH PROPOFOL N/A 10/15/2018  Procedure: COLONOSCOPY WITH PROPOFOL;  Surgeon: Jonathon Bellows, MD;  Location: [facility];  Service: Gastroenterology;  Laterality: N/A;  . MULTIPLE TOOTH EXTRACTIONS    . NO PAST SURGERIES      No family history on file.  Social History   Socioeconomic History  . Marital status: Single     Spouse name: Not on file  . Number of children: 2  . Years of education: 66  . Highest education level: High school graduate  Occupational History  . Occupation: unemployed  Social Needs  . Financial resource strain: Very hard  . Food insecurity    Worry: Often true    Inability: Often true  . Transportation needs    Medical: Yes    Non-medical: Yes  Tobacco Use  . Smoking status: Current Some Day Smoker    Packs/day: 0.25    Years: 40.00    Pack years: 10.00    Types: Cigarettes  . Smokeless tobacco: Never Used  . Tobacco comment: currently smoking .25 ppd  Substance and Sexual Activity  . Alcohol use: Not Currently    Comment: rarely   . Drug use: Yes    Frequency: 2.0 times per week    Types: Marijuana, Cocaine  . Sexual activity: Not Currently    Partners: Female  Lifestyle  . Physical activity    Days per week: 0 days    Minutes per session: 0 min  . Stress: Very much  Relationships  . Social Herbalist on phone: Never    Gets together: Never    Attends religious service: 1 to 4 times per year    Active member of club or organization: No    Attends meetings of clubs or organizations: Never    Relationship status: Never married  . Intimate partner violence    Fear of current or ex partner: No    Emotionally abused: No    Physically abused: No    Forced sexual activity: No  Other Topics Concern  . Not on file  Social History Narrative  . Not on file     Current Outpatient Medications:  .  busPIRone (BUSPAR) 5 MG tablet, Take 1 tablet (5 mg total) by mouth daily., Disp: 90 tablet, Rfl: 1 .  DULoxetine (CYMBALTA) 30 MG capsule, Take 1 capsule (30 mg total) by mouth daily., Disp: 90 capsule, Rfl: 0 .  hydrochlorothiazide (HYDRODIURIL) 12.5 MG tablet, Take 1 tablet (12.5 mg total) by mouth daily., Disp: 90 tablet, Rfl: 1  Allergies  Allergen Reactions  . Septra [Sulfamethoxazole-Trimethoprim] Other (See Comments)    Renal Failure  . Lactose  Nausea And Vomiting    I personally reviewed active problem list, medication list, allergies, health maintenance, notes from last encounter, lab results with the patient/caregiver today.   ROS  Constitutional: Negative for fever or weight change.  Respiratory: Negative for cough and shortness of breath.   Cardiovascular: Negative for chest pain or palpitations.  Gastrointestinal: Negative for abdominal pain, no bowel changes.  Musculoskeletal: Negative for gait problem or joint swelling.  Skin: Negative for rash.  Neurological: Negative for dizziness or headache.  No other specific complaints in a complete review of systems (except as listed in HPI above)  Objective  Virtual encounter, vitals not obtained.  There is no height or weight on file to calculate BMI.  Physical Exam  Pulmonary/Chest: Effort normal. No respiratory distress. Speaking in complete sentences Neurological: Pt is alert and oriented to person, place, and time. Speech  is normal Psychiatric: Patient has a normal mood and affect. behavior is normal. Judgment and thought content normal.    No results found for this or any previous visit (from the past 72 hour(s)).  PHQ2/9: Depression screen [facility] 2/9 04/06/2019 02/09/2019 01/04/2019 12/22/2018 12/17/2018  Decreased Interest 1 0 1 1 1   Down, Depressed, Hopeless 1 0 1 1 2   PHQ - 2 Score 2 0 2 2 3   Altered sleeping 0 0 2 2 2   Tired, decreased energy 1 0 1 1 2   Change in appetite 0 0 1 1 2   Feeling bad or failure about yourself  1 0 1 2 2   Trouble concentrating 1 0 1 1 2   Moving slowly or fidgety/restless 0 0 0 0 1  Suicidal thoughts 0 0 0 0 0  PHQ-9 Score 5 0 8 9 14   Difficult doing work/chores Somewhat difficult Not difficult at all Very difficult Very difficult Very difficult   PHQ-2/9 Result is positive.    Fall Risk: Fall Risk  04/06/2019 02/09/2019 01/04/2019 12/22/2018 12/17/2018  Falls in the past year? 0 0 0 0 0  Number falls in past yr: 0 0 0 - 0  Injury  with Fall? 0 0 0 - 0  Risk for fall due to : - - - - -  Follow up Falls evaluation completed - - - -    Assessment & Plan  1. Severe episode of recurrent major depressive disorder, without psychotic features ([place]) - Increase to 30mg  BID cymbalta, maintain buspar at 5mg  daily for now - DULoxetine (CYMBALTA) 30 MG capsule; Take 1 capsule (30 mg total) by mouth 2 (two) times daily.  Dispense: 180 capsule; Refill: 1  2. Chronic neck pain - Needs to reach back out to Dr. Rhea Bleacher office to schedule follow up, if discharged from practice, will need new referral. - DULoxetine (CYMBALTA) 30 MG capsule; Take 1 capsule (30 mg total) by mouth 2 (two) times daily.  Dispense: 180 capsule; Refill: 1  3. Chronic bilateral back pain, unspecified back location - Needs to reach back out to Dr. Rhea Bleacher office to schedule follow up, if discharged from practice, will need new referral. - DULoxetine (CYMBALTA) 30 MG capsule; Take 1 capsule (30 mg total) by mouth 2 (two) times daily.  Dispense: 180 capsule; Refill: 1  4. Right arm pain - Needs to reach back out to Dr. Rhea Bleacher office to schedule follow up, if discharged from practice, will need new referral. - DULoxetine (CYMBALTA) 30 MG capsule; Take 1 capsule (30 mg total) by mouth 2 (two) times daily.  Dispense: 180 capsule; Refill: 1  5. Irritability and anger - DULoxetine (CYMBALTA) 30 MG capsule; Take 1 capsule (30 mg total) by mouth 2 (two) times daily.  Dispense: 180 capsule; Refill: 1  6. Crack cocaine use - Gave number to [facility] as was provided to him a month ago from [employer].  Needs to get with them to determine housing and drug abuse recovery options.  7. Essential hypertension - Asymptomatic, not able to check BP, maintain current regimen.  8. Sebaceous cyst - Declines referral to Dr. Rosana Hoes for procedure.  9. Nonintractable episodic headache, unspecified headache type - Stable  10. Encounter for tobacco use  cessation counseling - Not ready to quit   I discussed the assessment and treatment plan with the patient. The patient was provided an opportunity to ask questions and all were answered. The patient agreed with the plan and demonstrated an understanding of  the instructions.   The patient was advised to call back or seek an in-person evaluation if the symptoms worsen or if the condition fails to improve as anticipated.  I provided 23 minutes of non-face-to-face time during this encounter.  [name], FNP

## 2019-04-06 NOTE — Progress Notes (Signed)
Virtual Visit via Video Note  I connected with@ on 04/06/19 at  2:00 PM EDT by a video enabled telemedicine application and verified that I am speaking with the correct person using two identifiers.  Location: Patient: [facility] Provider: Office   I discussed the limitations of evaluation and management by telemedicine and the availability of in person appointments. The patient expressed understanding and agreed to proceed.  I discussed the assessment and treatment plan with the patient. The patient was provided an opportunity to ask questions and all were answered. The patient agreed with the plan and demonstrated an understanding of the instructions.   The patient was advised to call back or seek an in-person evaluation if the symptoms worsen or if the condition fails to improve as anticipated.   In accordance with CMS guidelines, patient has met eligibility criteria including age, absence of signs or symptoms of lung cancer.  Social History   Tobacco Use  . Smoking status: Current Some Day Smoker    Packs/day: 0.25    Years: 40.00    Pack years: 10.00    Types: Cigarettes  . Smokeless tobacco: Never Used  . Tobacco comment: currently smoking .25 ppd  Substance Use Topics  . Alcohol use: Not Currently    Comment: rarely   . Drug use: Yes    Frequency: 2.0 times per week    Types: Marijuana, Cocaine      A shared decision-making session was conducted prior to the performance of CT scan. This includes one or more decision aids, includes benefits and harms of screening, follow-up diagnostic testing, over-diagnosis, false positive rate, and total radiation exposure.   Counseling on the importance of adherence to annual lung cancer LDCT screening, impact of co-morbidities, and ability or willingness to undergo diagnosis and treatment is imperative for compliance of the program.   Counseling on the importance of continued smoking cessation for former smokers; the importance of smoking  cessation for current smokers, and information about tobacco cessation interventions have been given to patient including [place] and 1800 quit [place] programs.   Written order for lung cancer screening with LDCT has been given to the patient and any and all questions have been answered to the best of my abilities.    Yearly follow up will be coordinated by Burgess Estelle, Thoracic Navigator.  I provided 15 minutes of non face-to-face telephone visit time during this encounter, and > 50% was spent counseling as documented under my assessment & plan.   Jacquelin Hawking, NP

## 2019-04-08 ENCOUNTER — Telehealth: Payer: Self-pay

## 2019-04-08 ENCOUNTER — Encounter: Payer: Self-pay | Admitting: *Deleted

## 2019-04-08 ENCOUNTER — Ambulatory Visit: Payer: Self-pay | Admitting: *Deleted

## 2019-04-08 NOTE — Progress Notes (Signed)
This encounter was created in error - please disregard.

## 2019-04-08 NOTE — Chronic Care Management (AMB) (Signed)
   Care Management   Unsuccessful Call Note 04/08/2019 Name: James Padilla MRN: [id number] DOB: May 21, 1966  Patient  is a 53  year old male who sees Raelyn Ensign, FNP for primary care. Raelyn Ensign, FNP asked the CCM team to consult the patient for community resources for housing and substance abuse treatment.       This social worker was unable to reach patient via telephone today for follow up call regarding resources provided. I have left HIPAA compliant voicemail asking patient to return my call. (unsuccessful outreach #[phone]).   Plan: This Education officer, museum will cease all further attempts to reach patient at this time, however will be happy to engage patient upon his return call.      Elliot Gurney, [place] Administrator, [facility] Care Management [phone]

## 2019-04-08 NOTE — Telephone Encounter (Signed)
Patient notified of test results 

## 2019-04-19 ENCOUNTER — Ambulatory Visit: Payer: Medicaid Other | Admitting: Family Medicine

## 2019-04-23 ENCOUNTER — Telehealth: Payer: Self-pay | Admitting: Family Medicine

## 2019-04-23 NOTE — Telephone Encounter (Signed)
Was faxed back to disability

## 2019-04-23 NOTE — Telephone Encounter (Signed)
Shanon Brow calling from [place] is calling to see if the disability questionnaire was received via mail. It was mailed on 04/07/2019.  Please advise [phone] Case number [id number]

## 2019-11-03 DIAGNOSIS — H538 Other visual disturbances: Secondary | ICD-10-CM | POA: Diagnosis not present

## 2020-03-04 IMAGING — CT CT HEAD W/O CM
3 series · 16 of 46 positions shown, 19 images · non-contrast
Comparison: None.

CLINICAL DATA: Right arm weakness and pain

EXAM:
CT HEAD WITHOUT CONTRAST
TECHNIQUE: Contiguous axial images were obtained from the base of the skull
through the vertex without intravenous contrast.

[Series 2: head wo · axial · 0.47mm/px · z∈[-104,+16]mm · 10 of 29 slices shown, 13 images]
[im 3/29  brain]
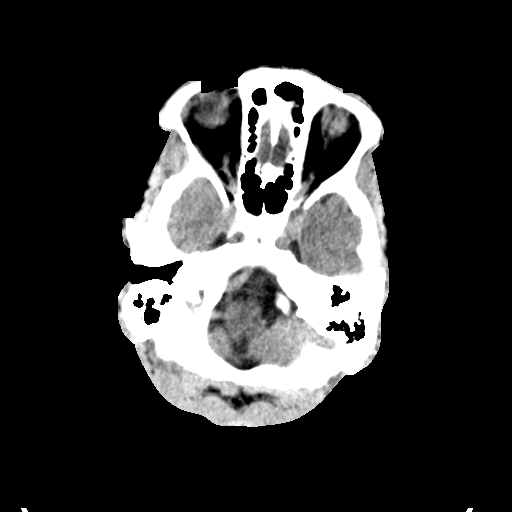
[im 3/29  bone]
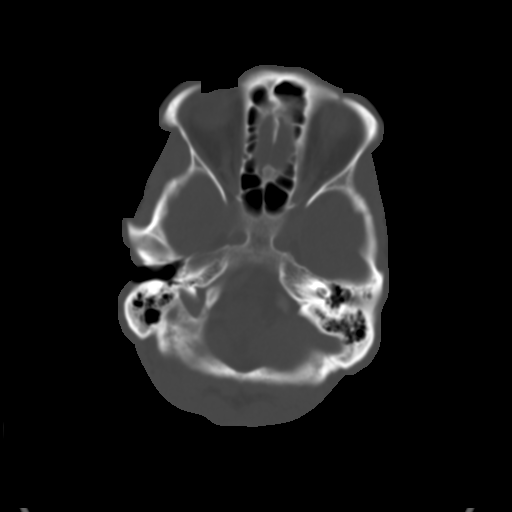
[im 6/29  brain]
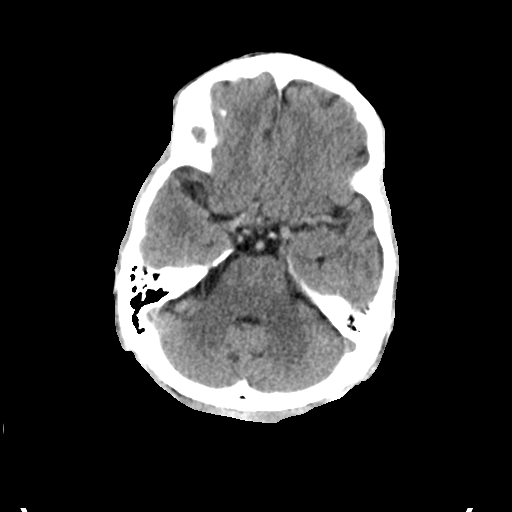
[im 8/29  brain]
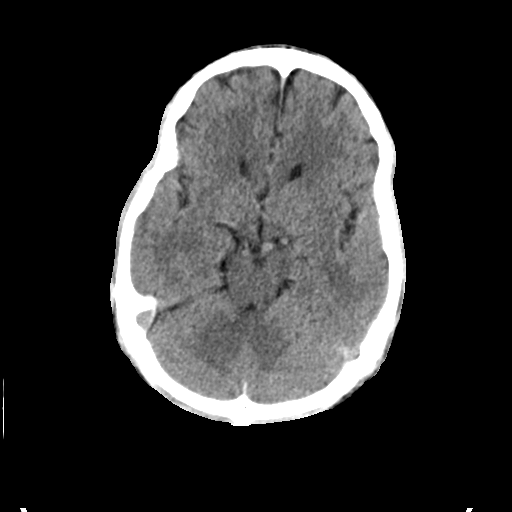
[im 11/29  brain]
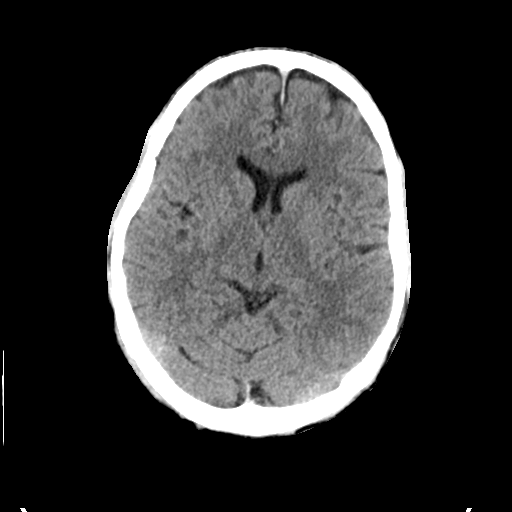
[im 14/29  brain]
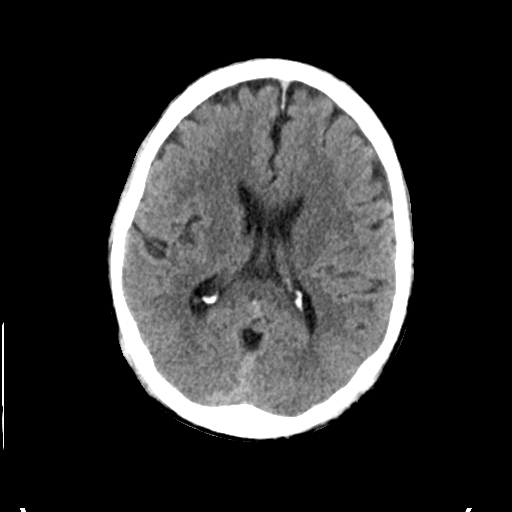
[im 14/29  bone]
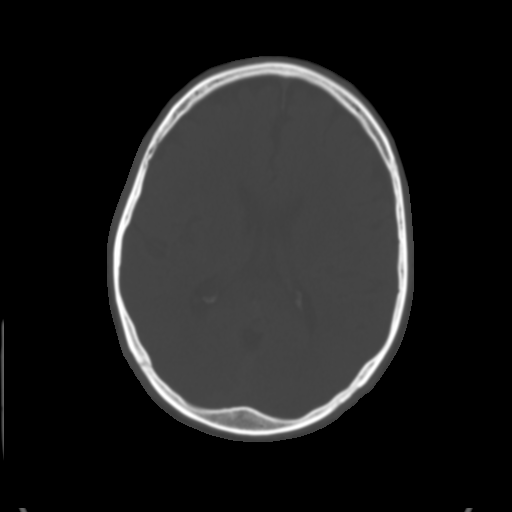
[im 16/29  brain]
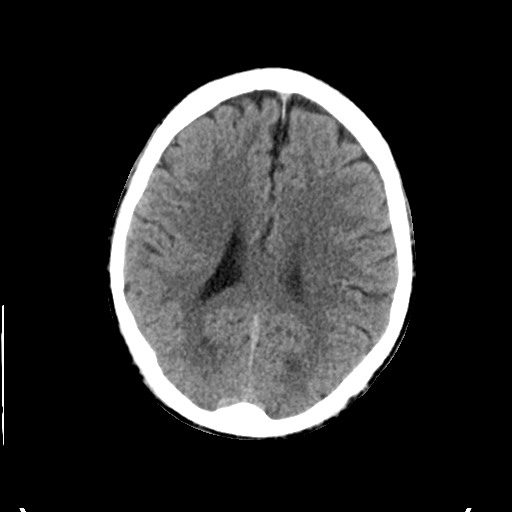
[im 19/29  brain]
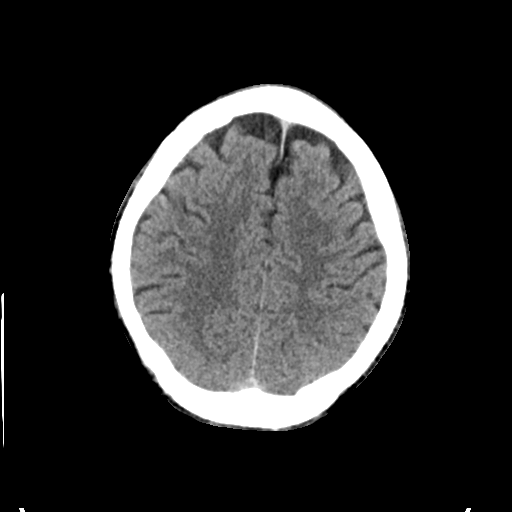
[im 22/29  brain]
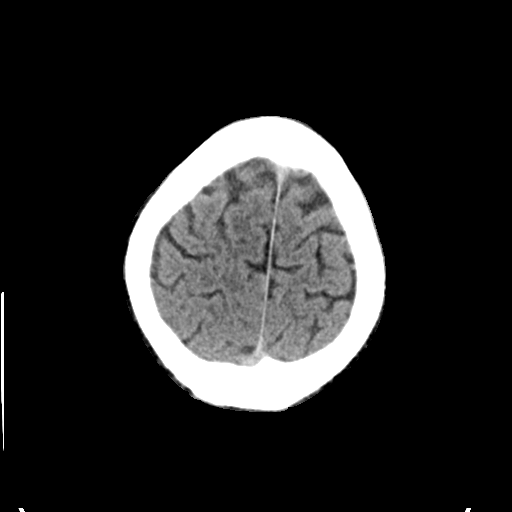
[im 24/29  brain]
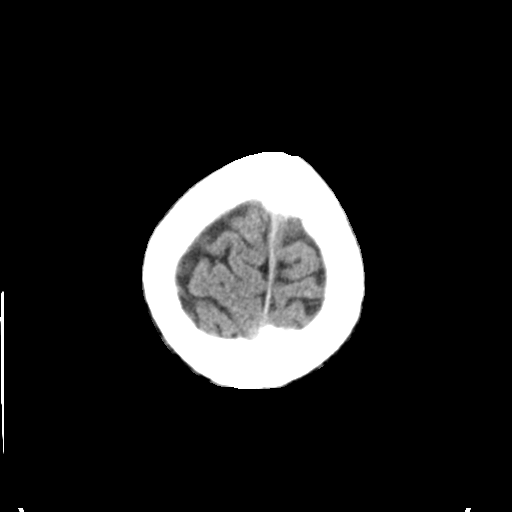
[im 24/29  bone]
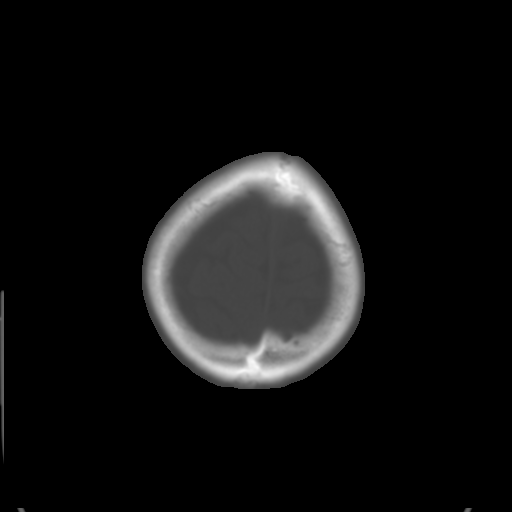
[im 27/29  brain]
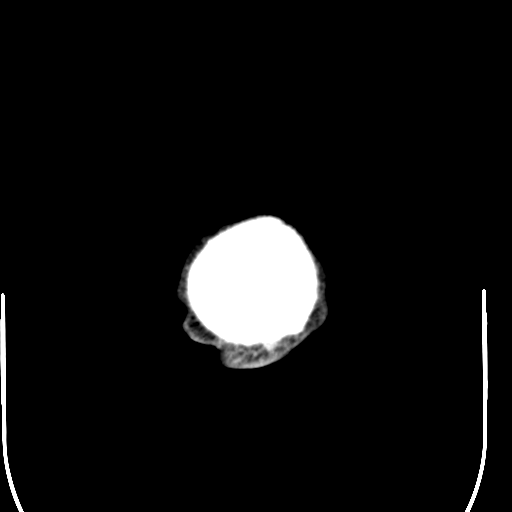

[Series 4: coronal soft tissue · coronal · 0.29mm/px · 3 of 65 slices shown]
[im 22/65  brain]
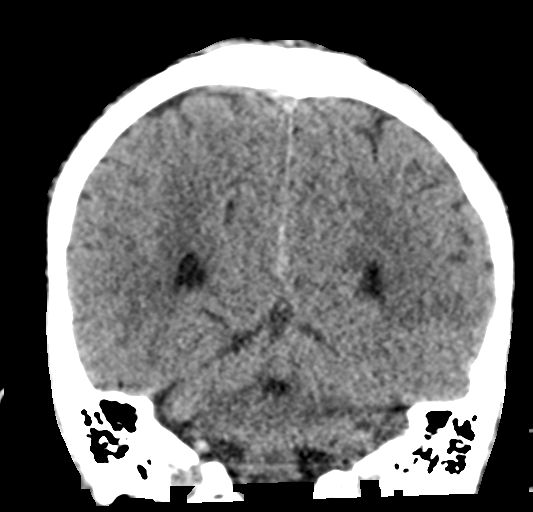
[im 29/65  brain]
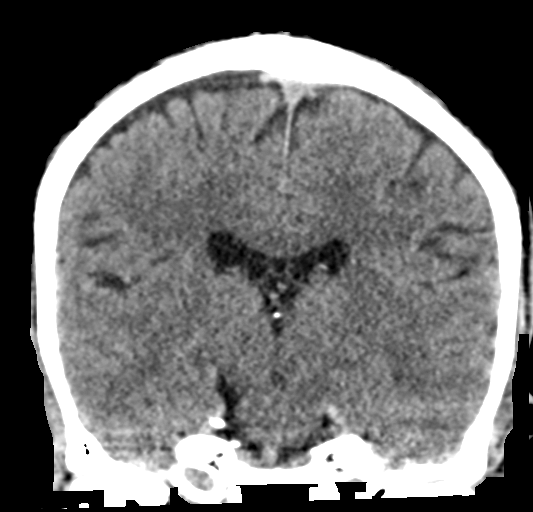
[im 36/65  brain]
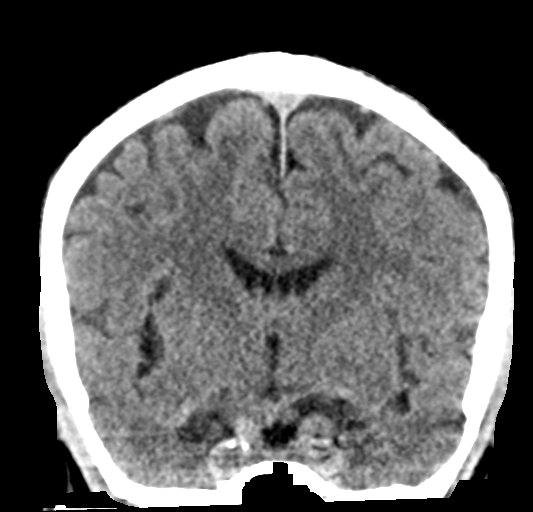

[Series 5: sagittal soft tissue · sagittal · 0.29mm/px · 3 of 51 slices shown]
[im 17/51  brain]
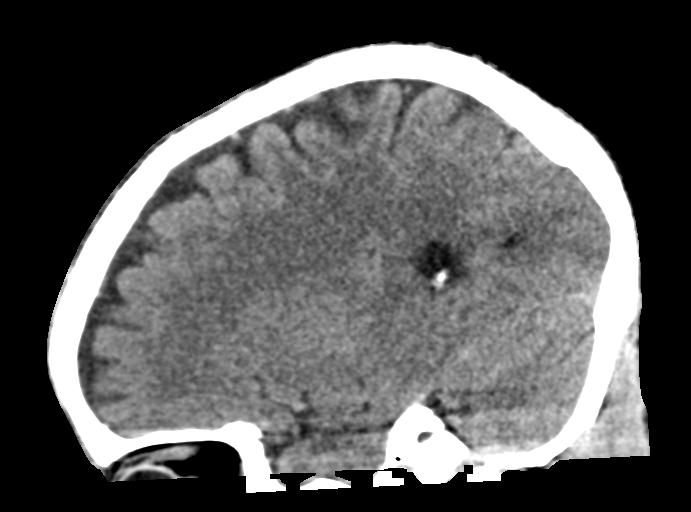
[im 26/51  brain]
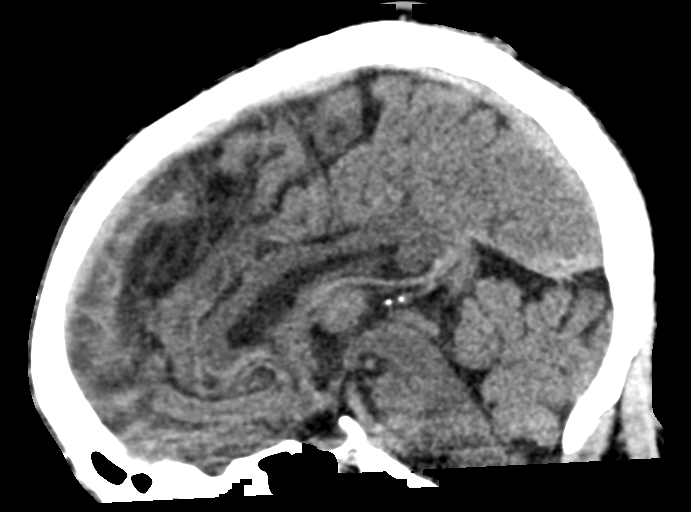
[im 34/51  brain]
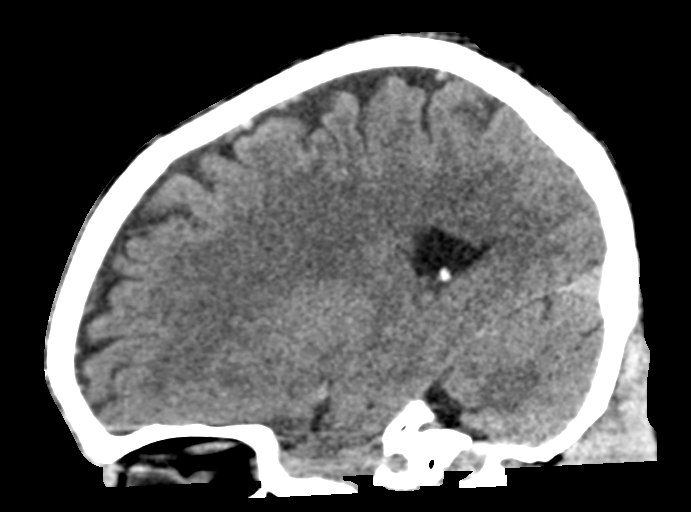

[16 of 46 positions shown; findings below may reference images not displayed]

FINDINGS: Brain: No evidence of acute infarction, hemorrhage, hydrocephalus,
extra-axial collection or mass lesion/mass effect.

Vascular: No hyperdense vessel or unexpected calcification.

Skull: Normal. Negative for fracture or focal lesion.

Sinuses/Orbits: The visualized paranasal sinuses are essentially
clear. The mastoid air cells are unopacified.

Other: None.
IMPRESSION: Normal head CT.

## 2020-03-16 ENCOUNTER — Ambulatory Visit: Payer: Medicaid Other | Admitting: Internal Medicine

## 2020-03-16 ENCOUNTER — Other Ambulatory Visit: Payer: Self-pay

## 2020-03-16 ENCOUNTER — Encounter: Payer: Self-pay | Admitting: Internal Medicine

## 2020-03-16 VITALS — BP 120/84 | HR 97 | Temp 98.5°F | Resp 16 | Ht 65.0 in | Wt 122.0 lb

## 2020-03-16 DIAGNOSIS — F339 Major depressive disorder, recurrent, unspecified: Secondary | ICD-10-CM | POA: Diagnosis not present

## 2020-03-16 DIAGNOSIS — L0291 Cutaneous abscess, unspecified: Secondary | ICD-10-CM | POA: Diagnosis not present

## 2020-03-16 MED ORDER — CEPHALEXIN 500 MG PO CAPS: 500.0000 mg | ORAL_CAPSULE | Freq: Three times a day (TID) | ORAL | 0 refills | Status: AC

## 2020-03-16 NOTE — Patient Instructions (Signed)

## 2020-03-16 NOTE — Progress Notes (Signed)
Patient ID: James Padilla, male    DOB: 06-Aug-1966, 54 y.o.   MRN: [id number]  PCP: [name], FNP  Chief Complaint  Patient presents with  . Recurrent Skin Infections    boil on his back    Subjective:   James Padilla is a 54 y.o. male, presents to clinic with CC of the following:  Chief Complaint  Patient presents with  . Recurrent Skin Infections    boil on his back    HPI:  Patient is a 54 year old male patient of Raelyn Ensign Last visit with Raquel Sarna was August 2020 with that note reviewed. On that visit he was noted to have a sebaceous cyst in his left shoulder region and he saw Dr. Rosana Hoes who was planning to remove the cyst when restrictions from Covid were lifted. He follows up today with concerns for a boil on his back. He was a difficult historian.  He called previously noting the back area was painful, he noted that was last week, and since it popped, with pus and blood coming out, and he tried to squeeze the stop well.  He states the pain is definitely better after that happened.  He denied any fevers.  Notes he does feel warm at times. He states he never had a procedure that removed a cyst, did note he has had to go to a hospital in the past but likely to get an abscess drained, not a surgical cyst removal.  He brought medicines with him today that he is taking, the duloxetine, BuSpar, and hydrochlorothiazide, with a fair amount of days left in the bottles and based on the dates on the bottles, he obviously has not been taking these medicines frequently.  He states he often does not take these medicines on any regular basis.  He states he has had a lot of increased stresses in the recent past, and that contributes to his feeling down at times.  He has never seen counselors in his past. He has struggled with homelessness in the past, and drug use, and has been guided with social work help in the past, and do feel getting them involved again will be helpful.  Patient  Active Problem List   Diagnosis Date Noted  . Essential hypertension 01/04/2019  . Crack cocaine use 12/17/2018  . Sebaceous cyst 12/17/2018  . Nonintractable episodic headache 10/05/2018  . Frequency of urination and polyuria 10/05/2018  . Encounter for tobacco use cessation counseling 10/05/2018  . Severe episode of recurrent major depressive disorder, without psychotic features ([place]) 10/05/2018  . Irritability and anger 10/05/2018  . Homelessness 09/19/2018  . Chronic neck pain 09/19/2018  . Chronic bilateral back pain 09/19/2018  . Right arm pain 08/14/2018      Current Outpatient Medications:  .  DULoxetine (CYMBALTA) 30 MG capsule, Take 1 capsule (30 mg total) by mouth 2 (two) times daily., Disp: 180 capsule, Rfl: 1 .  GABAPENTIN PO, Take 500 mg by mouth., Disp: , Rfl:  .  hydrochlorothiazide (HYDRODIURIL) 12.5 MG tablet, Take 1 tablet (12.5 mg total) by mouth daily., Disp: 90 tablet, Rfl: 1 .  busPIRone (BUSPAR) 5 MG tablet, Take 1 tablet (5 mg total) by mouth daily. (Patient not taking: Reported on 03/16/2020), Disp: 90 tablet, Rfl: 1   Allergies  Allergen Reactions  . Septra [Sulfamethoxazole-Trimethoprim] Other (See Comments)    Renal Failure  . Lactose Nausea And Vomiting     Past Surgical History:  Procedure Laterality Date  .  COLONOSCOPY WITH PROPOFOL N/A 10/15/2018   Procedure: COLONOSCOPY WITH PROPOFOL;  Surgeon: Jonathon Bellows, MD;  Location: [facility] END[facility]COPY;  Service: Gastroenterology;  Laterality: N/A;  . MULTIPLE TOOTH EXTRACTIONS    . NO PAST SURGERIES       History reviewed. No pertinent family history.   Social History   Tobacco Use  . Smoking status: Current Some Day Smoker    Packs/day: 0.25    Years: 40.00    Pack years: 10.00    Types: Cigarettes  . Smokeless tobacco: Never Used  . Tobacco comment: currently smoking .25 ppd  Substance Use Topics  . Alcohol use: Not Currently    Comment: rarely     With staff assistance, above reviewed  with the patient today.  ROS: As per HPI, otherwise no specific complaints on a limited and focused system review   No results found for this or any previous visit (from the past 72 hour(s)).   PHQ2/9: Depression screen [facility] 2/9 03/16/2020 04/06/2019 02/09/2019 01/04/2019 12/22/2018  Decreased Interest 0 1 0 1 1  Down, Depressed, Hopeless 3 1 0 1 1  PHQ - 2 Score 3 2 0 2 2  Altered sleeping 1 0 0 2 2  Tired, decreased energy 0 1 0 1 1  Change in appetite 0 0 0 1 1  Feeling bad or failure about yourself  1 1 0 1 2  Trouble concentrating 1 1 0 1 1  Moving slowly or fidgety/restless 0 0 0 0 0  Suicidal thoughts 0 0 0 0 0  PHQ-9 Score 6 5 0 8 9  Difficult doing work/chores Somewhat difficult Somewhat difficult Not difficult at all Very difficult Very difficult  Some recent data might be hidden   PHQ-2/9 Result reviewed, managed for depression with cymbalta and buspar, although not taking the negative medicines regularly in the past, no HI/SI  Fall Risk: Fall Risk  03/16/2020 04/06/2019 02/09/2019 01/04/2019 12/22/2018  Falls in the past year? 0 0 0 0 0  Number falls in past yr: 0 0 0 0 -  Injury with Fall? 0 0 0 0 -  Risk for fall due to : - - - - -  Follow up - Falls evaluation completed - - -      Objective:   Vitals:   03/16/20 1312  BP: 120/84  Pulse: 97  Resp: 16  Temp: 98.5 F (36.9 C)  TempSrc: Temporal  SpO2: 100%  Weight: 122 lb (55.3 kg)  Height: 5\' 5"  (1.651 m)    Body mass index is 20.3 kg/m.  Physical Exam   NAD, masked, HEENT - [place]/AT, sclera anicteric,  Car - RRR without m/g/r Pulm- RR and effort normal at rest, CTA without wheeze or rales Back -a small central scabbed area was present on the left upper back with some mild surrounding erythema, approximately golf ball sized, with tenderness palpating this area.  There was a faint amount of induration, just very centrally beneath the scab.  Neuro/psychiatric - affect was not flat, answers questions  appropriately.  Alert with speech not rapid  Results for orders placed or performed during the hospital encounter of 03/10/19  Urine Drug Screen, Qualitative ([facility] only)  Result Value Ref Range   Tricyclic, Ur Screen NONE DETECTED NONE DETECTED   Amphetamines, Ur Screen NONE DETECTED NONE DETECTED   MDMA (Ecstasy)Ur Screen NONE DETECTED NONE DETECTED   Cocaine Metabolite,Ur [place] POSITIVE (A) NONE DETECTED   Opiate, Ur Screen NONE DETECTED NONE DETECTED  Phencyclidine (PCP) Ur S NONE DETECTED NONE DETECTED   Cannabinoid 50 Ng, Ur [place] POSITIVE (A) NONE DETECTED   Barbiturates, Ur Screen NONE DETECTED NONE DETECTED   Benzodiazepine, Ur Scrn NONE DETECTED NONE DETECTED   Methadone Scn, Ur NONE DETECTED NONE DETECTED       Assessment & Plan:    1. Abscess Educated patient on this, and with pressure surrounding the scabbed area, a small amount of purulent drainage was expressed with some blood-tinged component to it. A swab was used to obtain a specimen for culture and sensitivity. We will start a Keflex product, take 1 tablet 3 times a day as await the culture.  He is allergic to Bactrim, and did not want to start that presently.  Concern for potential side effects from a clindamycin type product, and will await the culture results before expanding the antibiotic regimen to include coverage for MRSA if that is needed based on culture results. Emphasized to him applying warm compresses to the area to help get this to completely drain and keep the area clean, and loosely covered, and it was loosely covered with a gauze dressing today in the office. - cephALEXin (KEFLEX) 500 MG capsule; Take 1 capsule (500 mg total) by mouth 3 (three) times daily for 7 days.  Dispense: 21 capsule; Refill: 0 - Wound culture  2. Recurrent major depressive disorder, remission status unspecified ([place]) Discussed the importance of taking his medications for his depression regularly, and he had dose with him today to  take. Also provided a list of counseling services in the area that can be helpful, and pending his insurance issues, we will try to get counseling involved to help. Also asked Melissa to help with having social work make contact with him to help with the counseling referral and other resources that may be available.  Await the culture and sensitivity results and will be in contact him with those results, and should follow-up if this is not improving or more problematic over time as well.        Towanda Malkin, MD 03/16/20 1:29 PM

## 2020-03-17 ENCOUNTER — Other Ambulatory Visit: Payer: Self-pay

## 2020-03-17 DIAGNOSIS — F332 Major depressive disorder, recurrent severe without psychotic features: Secondary | ICD-10-CM

## 2020-03-17 DIAGNOSIS — M79601 Pain in right arm: Secondary | ICD-10-CM

## 2020-03-17 DIAGNOSIS — R454 Irritability and anger: Secondary | ICD-10-CM

## 2020-03-17 DIAGNOSIS — M542 Cervicalgia: Secondary | ICD-10-CM

## 2020-03-17 DIAGNOSIS — G8929 Other chronic pain: Secondary | ICD-10-CM

## 2020-03-17 MED ORDER — HYDROCHLOROTHIAZIDE 12.5 MG PO TABS: 12.5000 mg | ORAL_TABLET | Freq: Every day | ORAL | 1 refills | Status: DC

## 2020-03-17 MED ORDER — BUSPIRONE HCL 5 MG PO TABS: 5.0000 mg | ORAL_TABLET | Freq: Every day | ORAL | 1 refills | Status: DC

## 2020-03-19 LAB — WOUND CULTURE
MICRO NUMBER:: 10741366
SPECIMEN QUALITY:: ADEQUATE

## 2020-03-28 ENCOUNTER — Telehealth: Payer: Self-pay

## 2020-03-28 NOTE — Telephone Encounter (Signed)
Patient has been notified that the low dose lung cancer screening CT scan is due currently or will be in near future.  Confirmed that patient is within the appropriate age range and asymptomatic, (no signs or symptoms of lung cancer).  Patient denies illness that would prevent curative treatment for lung cancer if found.  Patient is agreeable for CT scan being scheduled.    Verified smoking history (current smoker, with 40 year 0.25 ppd history).   CT scheduled for 04/12/20 @ 11:45

## 2020-03-29 ENCOUNTER — Other Ambulatory Visit: Payer: Self-pay | Admitting: *Deleted

## 2020-03-29 DIAGNOSIS — Z122 Encounter for screening for malignant neoplasm of respiratory organs: Secondary | ICD-10-CM

## 2020-03-29 DIAGNOSIS — Z87891 Personal history of nicotine dependence: Secondary | ICD-10-CM

## 2020-04-10 ENCOUNTER — Telehealth: Payer: Self-pay | Admitting: *Deleted

## 2020-04-10 NOTE — Telephone Encounter (Signed)
Left voicemail for patient to NOT come for scan the 18th, this Wednesday, for lung screening. Explained that he will be contacted for reschedule as soon as we have the authorization for it.

## 2020-04-12 ENCOUNTER — Ambulatory Visit: Payer: Medicaid Other

## 2020-04-17 ENCOUNTER — Ambulatory Visit
Admission: RE | Admit: 2020-04-17 | Discharge: 2020-04-17 | Disposition: A | Discharge status: Home / Self Care | Payer: Medicaid Other | Source: Ambulatory Visit | Attending: Oncology | Admitting: Oncology

## 2020-04-17 ENCOUNTER — Other Ambulatory Visit: Payer: Self-pay

## 2020-04-17 DIAGNOSIS — Z122 Encounter for screening for malignant neoplasm of respiratory organs: Secondary | ICD-10-CM | POA: Diagnosis not present

## 2020-04-17 DIAGNOSIS — Z87891 Personal history of nicotine dependence: Secondary | ICD-10-CM | POA: Diagnosis not present

## 2020-04-22 ENCOUNTER — Encounter: Payer: Self-pay | Admitting: *Deleted

## 2020-06-27 ENCOUNTER — Ambulatory Visit (INDEPENDENT_AMBULATORY_CARE_PROVIDER_SITE_OTHER): Payer: Medicaid Other | Admitting: Internal Medicine

## 2020-06-27 ENCOUNTER — Telehealth: Payer: Self-pay

## 2020-06-27 ENCOUNTER — Encounter: Payer: Self-pay | Admitting: Internal Medicine

## 2020-06-27 ENCOUNTER — Other Ambulatory Visit: Payer: Self-pay

## 2020-06-27 VITALS — BP 160/110 | HR 84 | Temp 97.6°F | Resp 16 | Ht 63.0 in | Wt 125.8 lb

## 2020-06-27 DIAGNOSIS — L0291 Cutaneous abscess, unspecified: Secondary | ICD-10-CM

## 2020-06-27 DIAGNOSIS — M542 Cervicalgia: Secondary | ICD-10-CM

## 2020-06-27 DIAGNOSIS — F339 Major depressive disorder, recurrent, unspecified: Secondary | ICD-10-CM | POA: Diagnosis not present

## 2020-06-27 DIAGNOSIS — M549 Dorsalgia, unspecified: Secondary | ICD-10-CM

## 2020-06-27 DIAGNOSIS — M79601 Pain in right arm: Secondary | ICD-10-CM | POA: Diagnosis not present

## 2020-06-27 DIAGNOSIS — R454 Irritability and anger: Secondary | ICD-10-CM

## 2020-06-27 DIAGNOSIS — I10 Essential (primary) hypertension: Secondary | ICD-10-CM

## 2020-06-27 DIAGNOSIS — Z23 Encounter for immunization: Secondary | ICD-10-CM

## 2020-06-27 DIAGNOSIS — F332 Major depressive disorder, recurrent severe without psychotic features: Secondary | ICD-10-CM | POA: Diagnosis not present

## 2020-06-27 DIAGNOSIS — G8929 Other chronic pain: Secondary | ICD-10-CM

## 2020-06-27 MED ORDER — GABAPENTIN 300 MG PO CAPS: 300.0000 mg | ORAL_CAPSULE | Freq: Two times a day (BID) | ORAL | 1 refills | Status: DC | PRN

## 2020-06-27 MED ORDER — BUSPIRONE HCL 5 MG PO TABS: 5.0000 mg | ORAL_TABLET | Freq: Every day | ORAL | 1 refills | Status: DC

## 2020-06-27 MED ORDER — DULOXETINE HCL 30 MG PO CPEP: 30.0000 mg | ORAL_CAPSULE | Freq: Two times a day (BID) | ORAL | 1 refills | Status: DC

## 2020-06-27 MED ORDER — CEPHALEXIN 500 MG PO CAPS: 500.0000 mg | ORAL_CAPSULE | Freq: Three times a day (TID) | ORAL | 0 refills | Status: AC

## 2020-06-27 MED ORDER — HYDROCHLOROTHIAZIDE 12.5 MG PO TABS: 12.5000 mg | ORAL_TABLET | Freq: Every day | ORAL | 1 refills | Status: DC

## 2020-06-27 NOTE — Telephone Encounter (Signed)
I called the pharmacy after I spoke with him and they said they don't have a prescription for Gabapentin.

## 2020-06-27 NOTE — Addendum Note (Signed)
Addended by: Lebron Conners D on: 06/27/2020 03:03 PM   Modules accepted: Orders

## 2020-06-27 NOTE — Telephone Encounter (Signed)
Where you going to refill this medication for him.

## 2020-06-27 NOTE — Progress Notes (Addendum)
Patient ID: James Padilla, male    DOB: 20-May-1966, 54 y.o.   MRN: [id number]  PCP: Towanda Malkin, MD  Chief Complaint  Patient presents with  . Recurrent Skin Infections    Subjective:   James Padilla is a 54 y.o. male, presents to clinic with CC of the following:  Chief Complaint  Patient presents with  . Recurrent Skin Infections    HPI:  Patient is a 54 year old male Was seen last by me in July with a similar complaint. He was noted to be a difficult historian at that visit. At that visit, he noted there was plans to have a sebaceous cyst in his left shoulder region removed, delayed due to Covid restrictions. At that visit, noted with homelessness in the past, drug use, and noncompliance with medications all concerns, and did note trying to get social work involved again would be helpful, and trying to move forward with Melissa's help in contacting them.   He returns today noting that he had back pain again in the abscess area starting last week, and on Sunday going to church, he noted that it was bloody in this area on his shirt, and it did open up and drain.  Helped some with the discomfort, still some soreness.  He notes this is the same area where it happened in July. He denies any fevers or chills, no nausea or vomiting.  No chest pains or shortness of breath or abdominal pains. When I noted his blood pressure was up some on initial check, he noted he has not been taking any of his medications, and would like to get refills to resume taking them. He takes hydrochlorothiazide for his blood pressure, although has not taken regularly in the recent past. He also takes buspirone and duloxetine for anxiety and depression concerns, and also reports low back pain issues in the past. He notes he has been affected recently by a death of a friend about a month ago, struggled some with feeling a little down, denies any thoughts of hurting self or others presently.  He does  feel like getting back on the medications would be helpful and that was encouraged today as well.   Patient Active Problem List   Diagnosis Date Noted  . Essential hypertension 01/04/2019  . Crack cocaine use 12/17/2018  . Sebaceous cyst 12/17/2018  . Nonintractable episodic headache 10/05/2018  . Frequency of urination and polyuria 10/05/2018  . Encounter for tobacco use cessation counseling 10/05/2018  . Severe episode of recurrent major depressive disorder, without psychotic features ([place]) 10/05/2018  . Irritability and anger 10/05/2018  . Homelessness 09/19/2018  . Chronic neck pain 09/19/2018  . Chronic bilateral back pain 09/19/2018  . Right arm pain 08/14/2018      Current Outpatient Medications:  .  busPIRone (BUSPAR) 5 MG tablet, Take 1 tablet (5 mg total) by mouth daily., Disp: 90 tablet, Rfl: 1 .  DULoxetine (CYMBALTA) 30 MG capsule, Take 1 capsule (30 mg total) by mouth 2 (two) times daily., Disp: 180 capsule, Rfl: 1 .  GABAPENTIN PO, Take 500 mg by mouth., Disp: , Rfl:  .  hydrochlorothiazide (HYDRODIURIL) 12.5 MG tablet, Take 1 tablet (12.5 mg total) by mouth daily., Disp: 90 tablet, Rfl: 1   Allergies  Allergen Reactions  . Septra [Sulfamethoxazole-Trimethoprim] Other (See Comments)    Renal Failure  . Lactose Nausea And Vomiting     Past Surgical History:  Procedure Laterality Date  . COLONOSCOPY WITH PROPOFOL  N/A 10/15/2018   Procedure: COLONOSCOPY WITH PROPOFOL;  Surgeon: Jonathon Bellows, MD;  Location: [facility] ENDOSCOPY;  Service: Gastroenterology;  Laterality: N/A;  . MULTIPLE TOOTH EXTRACTIONS    . NO PAST SURGERIES       History reviewed. No pertinent family history.   Social History   Tobacco Use  . Smoking status: Current Some Day Smoker    Packs/day: 0.25    Years: 40.00    Pack years: 10.00    Types: Cigarettes  . Smokeless tobacco: Never Used  . Tobacco comment: currently smoking .25 ppd  Substance Use Topics  . Alcohol use: Not Currently      Comment: rarely     With staff assistance, above reviewed with the patient today.  ROS: As per HPI, otherwise no specific complaints on a limited and focused system review   No results found for this or any previous visit (from the past 72 hour(s)).   PHQ2/9: Depression screen [facility] 2/9 06/27/2020 03/16/2020 04/06/2019 02/09/2019 01/04/2019  Decreased Interest 2 0 1 0 1  Down, Depressed, Hopeless 2 3 1  0 1  PHQ - 2 Score 4 3 2  0 2  Altered sleeping 3 1 0 0 2  Tired, decreased energy 2 0 1 0 1  Change in appetite 2 0 0 0 1  Feeling bad or failure about yourself  1 1 1  0 1  Trouble concentrating 1 1 1  0 1  Moving slowly or fidgety/restless 1 0 0 0 0  Suicidal thoughts 0 0 0 0 0  PHQ-9 Score - 6 5 0 8  Difficult doing work/chores Somewhat difficult Somewhat difficult Somewhat difficult Not difficult at all Very difficult  Some recent data might be hidden   PHQ-2/9 Result reviewed  Fall Risk: Fall Risk  06/27/2020 03/16/2020 04/06/2019 02/09/2019 01/04/2019  Falls in the past year? 1 0 0 0 0  Number falls in past yr: 1 0 0 0 0  Injury with Fall? 0 0 0 0 0  Risk for fall due to : - - - - -  Follow up - - Falls evaluation completed - -      Objective:   Vitals:   06/27/20 0910  BP: (!) 160/110  Pulse: 84  Resp: 16  Temp: 97.6 F (36.4 C)  TempSrc: Oral  SpO2: 98%  Weight: 125 lb 12.8 oz (57.1 kg)  Height: 5\' 3"  (1.6 m)    Body mass index is 22.28 kg/m. Recheck of his blood pressure by myself was 136/84 on the left with an adult cuff. Physical Exam   NAD, masked, HEENT - [place]/AT, sclera anicteric,  Car - RRR without m/g/r Pulm- RR and effort normal at rest, CTA without wheeze or rales Back -a small central scabbed area was present on the left upper back with very minimal mild surrounding erythema, with tenderness palpating this area.   Very minimal induration above the scabbed area, and with pressure, some mild bloody clearish drainage was noted with no thick purulence.   Neuro/psychiatric - affect was not flat, answers questions appropriately.             Alert with speech not rapid  Results for orders placed or performed in visit on 03/16/20  Wound culture   Specimen: Wound  Result Value Ref Range   MICRO NUMBER: [id number]    SPECIMEN QUALITY: Adequate    SOURCE: BOIL ON LEFT SHOULDER    STATUS: FINAL    GRAM STAIN:      Moderate  White blood cells seen Rare epithelial cells No organisms seen   RESULT:      Growth of skin flora (note: Growth does not include S. aureus, beta-hemolytic Streptococci or P. aeruginosa).       Assessment & Plan:    1. Abscess Educated patient on this, and with pressure surrounding the scabbed area, a very small amount of drainage was noted. We will start a Keflex product, take 1 tablet 3 times a day, and when cultured last time in July, no specific organism isolated.  No history of MRSA. Emphasized to him applying warm compresses to the area to help get this to completely drain and keep the area clean,  Did discuss if this is recurring on repetitive basis, possibly seeing general surgery to discuss potential removal a potential nidus, possibly a cystic area that repetitively is getting infected.  He did not want to proceed with the referral today, although noted he would consider if it iscontinuing to recur over time. - cephALEXin (KEFLEX) 500 MG capsule; Take 1 capsule (500 mg total) by mouth 3 (three) times daily for 7 days.  Dispense: 21 capsule; Refill: 0  2. Need for immunization against influenza  - Flu Vaccine QUAD 36+ mos IM  3. Recurrent major depressive disorder, remission status unspecified ([place]) Reviewed his PHQ-9, and has been a little more down recently due to a death of a friend noted as above.  Also, has not been taking his medications in the recent past. Felt best to resume his medications, with the Cymbalta helping with a depression concern noted.  Also on that for back pain issues in the  past. Continue to monitor.  4. Essential hypertension He has not been taking his medications, with his initial blood pressure slightly up today, although on recheck by me on the office visit, it was much improved.  The first check was immediately after walking him back to the office. Also noted some discomfort in the recent past could contribute to slight elevations in blood pressure. We will resume the hydrochlorothiazide product Discussed the follow-up to ensure his blood pressure is controlled, - hydrochlorothiazide (HYDRODIURIL) 12.5 MG tablet; Take 1 tablet (12.5 mg total) by mouth daily.  Dispense: 90 tablet; Refill: 1  5. Severe episode of recurrent major depressive disorder, without psychotic features ([place]) 6. Irritability and anger These diagnoses were associated with the BuSpar and Cymbalta medications initiated previously, and do feel returning to these medicines is appropriate, and he very much wanted to do so. - busPIRone (BUSPAR) 5 MG tablet; Take 1 tablet (5 mg total) by mouth daily.  Dispense: 90 tablet; Refill: 1 - DULoxetine (CYMBALTA) 30 MG capsule; Take 1 capsule (30 mg total) by mouth 2 (two) times daily.  Dispense: 180 capsule; Refill: 1  7. Chronic neck pain 8. Chronic bilateral back pain, unspecified back location These diagnoses were associated with the Cymbalta product initiation in the past, and he notes he still has some intermittent pains in the neck and back areas. Do feel resuming the Cymbalta is indicated and he very much wanted to do so. - DULoxetine (CYMBALTA) 30 MG capsule; Take 1 capsule (30 mg total) by mouth 2 (two) times daily.  Dispense: 180 capsule; Refill: 1   Emphasized the importance of compliance with his medications, and refill them today, and also to follow-up if his abscess area is not resolving, or more problematic over time or recurring. Did feel best having a follow-up visit to check his blood pressure again once he  is back on his medication,  and agreed to follow-up again in about 3 months time, and follow-up sooner as needed.  Addendum-he requested a refill of his gabapentin product as well and that was provided.  Can use 300 mg up to twice daily as needed   Towanda Malkin, MD 06/27/20 9:30 AM

## 2020-06-27 NOTE — Patient Instructions (Signed)
Apply warm compresses to the area on the back fairly liberally over the next few days to help get this area to completely drain.  Keep the area clean with warm soapy water  Also, take the antibiotic prescribed to your pharmacy.  Can continue taking the other medicines you are on previously with those medicines refilled for you today.

## 2020-06-27 NOTE — Telephone Encounter (Signed)
This prescription was delayed, sent at approximately 3:03 PM. They should have it at this point.

## 2020-06-27 NOTE — Telephone Encounter (Signed)
Copied from [place] [id number]. Topic: General - Other >> Jun 27, 2020  1:49 PM Yvette Rack wrote: Reason for CRM: Pt stated he is currently at his pharmacy and he was able to get all his medications except the GABAPENTIN PO. Pt requests call back

## 2020-06-27 NOTE — Telephone Encounter (Signed)
Please let him know that I did refill the gabapentin, and prescribed 300 mg, can take 1 tablet up to twice daily as needed. Thanks

## 2020-06-28 NOTE — Telephone Encounter (Signed)
Patient called.  Patient aware.  

## 2020-08-28 NOTE — Progress Notes (Signed)
Patient ID: James Padilla, male    DOB: July 08, 1966, 55 y.o.   MRN: [id number]  PCP: Towanda Malkin, MD  Chief Complaint  Patient presents with  . Recurrent Skin Infections  . Arm Pain  . Extremity Weakness    Subjective:   James Padilla is a 55 y.o. male, presents to clinic with CC of the following:  Chief Complaint  Patient presents with  . Recurrent Skin Infections  . Arm Pain  . Extremity Weakness    HPI:  Patient is a 55 year old male Was seen last by me 06/27/20 He was noted to be a difficult historian previously. At our first visit in July visit, he noted there was plans to have a sebaceous cyst in his left shoulder region removed, delayed due to Covid restrictions. At that visit, noted with homelessness in the past, drug use, and noncompliance with medications all concerns, and did note trying to get social work involved again would be helpful, and trying to move forward with Melissa's help in contacting them.   He returns today with the main complaint of the recurrence of the boil on his back.  It is more raised again, and uncomfortable at times.  Has not yet opened up and drained again presently.  He denies any fevers.  This continues to recur in that same area where it has happened previously.  Denies any chills, nausea or vomiting.  No chest pains or marked shortness of breath. He states he is taking his medicine now for his blood pressure, hydrochlorothiazide, although question how regularly he is taking that.  His initial blood pressure checks were high with my check being borderline high, and noted the importance of taking his blood pressure medicine regularly.  Also noted that some increased pain can increase pressures. He also takes buspirone and duloxetine for anxiety and depression concerns, although again question compliance with his medications at times.  No thoughts of hurting self presently. He has a history of chronic neck and back pains,  still notes these occur intermittently, and was prescribed the Cymbalta in the past to help in this realm as well. He notes he feels some numbness in his right pinky finger, and that has been present for a long time.  Also feels some numbness and tingling at times down his upper extremities, more the left upper extremity in the recent past.  Not dropping things.  At times can feel a little weaker.   Patient Active Problem List   Diagnosis Date Noted  . Essential hypertension 01/04/2019  . Crack cocaine use 12/17/2018  . Sebaceous cyst 12/17/2018  . Nonintractable episodic headache 10/05/2018  . Frequency of urination and polyuria 10/05/2018  . Encounter for tobacco use cessation counseling 10/05/2018  . Severe episode of recurrent major depressive disorder, without psychotic features ([place]) 10/05/2018  . Irritability and anger 10/05/2018  . Homelessness 09/19/2018  . Chronic neck pain 09/19/2018  . Chronic bilateral back pain 09/19/2018  . Right arm pain 08/14/2018      Current Outpatient Medications:  .  busPIRone (BUSPAR) 5 MG tablet, Take 1 tablet (5 mg total) by mouth daily., Disp: 90 tablet, Rfl: 1 .  DULoxetine (CYMBALTA) 30 MG capsule, Take 1 capsule (30 mg total) by mouth 2 (two) times daily., Disp: 180 capsule, Rfl: 1 .  gabapentin (NEURONTIN) 300 MG capsule, Take 1 capsule (300 mg total) by mouth 2 (two) times daily as needed., Disp: 180 capsule, Rfl: 1 .  hydrochlorothiazide (HYDRODIURIL) 12.5 MG tablet, Take 1 tablet (12.5 mg total) by mouth daily., Disp: 90 tablet, Rfl: 1   Allergies  Allergen Reactions  . Septra [Sulfamethoxazole-Trimethoprim] Other (See Comments)    Renal Failure  . Lactose Nausea And Vomiting     Past Surgical History:  Procedure Laterality Date  . COLONOSCOPY WITH PROPOFOL N/A 10/15/2018   Procedure: COLONOSCOPY WITH PROPOFOL;  Surgeon: Jonathon Bellows, MD;  Location: [facility] ENDOSCOPY;  Service: Gastroenterology;  Laterality: N/A;  . MULTIPLE TOOTH  EXTRACTIONS    . NO PAST SURGERIES       History reviewed. No pertinent family history.   Social History   Tobacco Use  . Smoking status: Current Some Day Smoker    Packs/day: 0.25    Years: 40.00    Pack years: 10.00    Types: Cigarettes  . Smokeless tobacco: Never Used  . Tobacco comment: currently smoking .25 ppd  Substance Use Topics  . Alcohol use: Not Currently    Comment: rarely     With staff assistance, above reviewed with the patient today.  ROS: As per HPI, otherwise no specific complaints on a limited and focused system review   No results found for this or any previous visit (from the past 72 hour(s)).   PHQ2/9: Depression screen [facility] 2/9 08/29/2020 06/27/2020 03/16/2020 04/06/2019 02/09/2019  Decreased Interest 1 2 0 1 0  Down, Depressed, Hopeless 1 2 3 1  0  PHQ - 2 Score 2 4 3 2  0  Altered sleeping 1 3 1  0 0  Tired, decreased energy 1 2 0 1 0  Change in appetite 0 2 0 0 0  Feeling bad or failure about yourself  1 1 1 1  0  Trouble concentrating 0 1 1 1  0  Moving slowly or fidgety/restless 0 1 0 0 0  Suicidal thoughts 0 0 0 0 0  PHQ-9 Score 5 - 6 5 0  Difficult doing work/chores Somewhat difficult Somewhat difficult Somewhat difficult Somewhat difficult Not difficult at all  Some recent data might be hidden   PHQ-2/9 Result reviewed  Fall Risk: Fall Risk  08/29/2020 06/27/2020 03/16/2020 04/06/2019 02/09/2019  Falls in the past year? 1 1 0 0 0  Number falls in past yr: 1 1 0 0 0  Injury with Fall? 0 0 0 0 0  Risk for fall due to : - - - - -  Follow up - - - Falls evaluation completed -      Objective:   Vitals:   08/29/20 0941 08/29/20 0942  BP: (!) 170/96 (!) 180/100  Pulse: 71   Resp: 16   Temp: (!) 97.5 F (36.4 C)   TempSrc: Oral   SpO2: 99%   Weight: 128 lb 12.8 oz (58.4 kg)   Height: 5\' 3"  (1.6 m)     Body mass index is 22.82 kg/m.  Physical Exam   Recheck of his blood pressure by myself was 140/90 on the right with a adult  cuff.  NAD, masked, mildly fatigued appearing. HEENT - [place]/AT, sclera anicteric, conjunctiva not injected, EOMI, Car - RRR without m/g/r Pulm- RR and effort normal at rest, CTA without wheeze or rales Abd -soft and nontender Back -a small raised area was present on the left upper back without marked surrounding erythema,  with mild tenderness palpating this area.  Very minimal induration below and surrounding the central raised area, no drainage presently. Extremities-had good range of motion of the shoulder joint with abduction and forward  elevation, good shoulder shrug, strength adequate, adequate grip strength bilateral, pulses good distally in the upper extremities, sensation intact to light touch in the upper extremities and hand, with him noting the right pinky finger having diminished sensation to touch (and has been like this for a long time per the patient). Neuro/psychiatric - affect was not flat,answers questions appropriately. Alert with speech not rapid  Results for orders placed or performed in visit on 03/16/20  Wound culture   Specimen: Wound  Result Value Ref Range   MICRO NUMBER: [id number]    SPECIMEN QUALITY: Adequate    SOURCE: BOIL ON LEFT SHOULDER    STATUS: FINAL    GRAM STAIN:      Moderate White blood cells seen Rare epithelial cells No organisms seen   RESULT:      Growth of skin flora (note: Growth does not include S. aureus, beta-hemolytic Streptococci or P. aeruginosa).       Assessment & Plan:   1. Abscess -recurrent Educated patient on this, and do feel he needs to see general surgery at this point given the recurrences, and likely have the area shelled out to prevent this from recurring over time. Again recommended applying warm compresses to the area to help get this to open and drain and keeping the area clean important.  Do not feel adding an additional antibiotic is appropriate presently.  2. Essential hypertension his initial blood  pressure checks were high today, with a recheck by myself slightly high and consistent with borderline hypertension.  He currently takes a hydrochlorothiazide product, and do question is getting this in every day.   Emphasized today the importance of compliance with the blood pressure medication.   We will hold off on adding another medication or increasing the hydrochlorothiazide dose presently I did note that some recent pain can increase blood pressures as well.   We will schedule follow-up again in about 4 weeks time, and if his blood pressures are remaining in his borderline high or high range and he is taking his hydrochlorothiazide product regularly, do feel increasing or adding additional medicine will be needed.  3.  Recurrent major depressive disorder, remission status unspecified 4. Irritability and anger  Reviewed his PHQ-9, and a little better today than previous.  Noted last time he had been a little more down due to a death of a friend.  Also, had not been taking his medications in the recent past before that last visit. Continue his current medication regimen presently Continue to monitor.  5. Chronic neck pain 6. Chronic bilateral back pain, unspecified back location These diagnoses were associated with the Cymbalta product initiation in the past, and he notes he still has some intermittent pains in the neck and back areas. Continue the Cymbalta which was resumed last visit. Continue to monitor.  7.  Neuropathy-right fifth digit chronically, with some intermittent numbness tingling in the upper extremities more left-sided in recent past.  Question if could be related to his chronic neck pain issues.  Has not had increased pains in the recent past. May need imaging of the cervical spine or other studies over time including EMG/nerve conduction studies, especially if the symptoms are persisting or increasing. Did note the colder weather can sometimes exacerbate the symptoms as  well, and emphasized keeping the extremities warm and wearing gloves to help.  Await input from general surgery presently, and schedule a follow-up in 4 weeks time, to help ensure his blood pressures are better controlled, and also  does not need a more annual physical component to his assessment with labs as well done as part of that. Felt best to have with a new provider that he will be seeing over time to help follow, as I will be leaving this practice in the next couple weeks, and he was aware of that. Should follow-up sooner as needed.      Towanda Malkin, MD 08/29/20 10:11 AM

## 2020-08-29 ENCOUNTER — Other Ambulatory Visit: Payer: Self-pay

## 2020-08-29 ENCOUNTER — Ambulatory Visit: Payer: Medicaid Other | Admitting: Internal Medicine

## 2020-08-29 ENCOUNTER — Encounter: Payer: Self-pay | Admitting: Internal Medicine

## 2020-08-29 VITALS — BP 180/100 | HR 71 | Temp 97.5°F | Resp 16 | Ht 63.0 in | Wt 128.8 lb

## 2020-08-29 DIAGNOSIS — F339 Major depressive disorder, recurrent, unspecified: Secondary | ICD-10-CM | POA: Diagnosis not present

## 2020-08-29 DIAGNOSIS — I10 Essential (primary) hypertension: Secondary | ICD-10-CM | POA: Diagnosis not present

## 2020-08-29 DIAGNOSIS — M549 Dorsalgia, unspecified: Secondary | ICD-10-CM

## 2020-08-29 DIAGNOSIS — G629 Polyneuropathy, unspecified: Secondary | ICD-10-CM | POA: Diagnosis not present

## 2020-08-29 DIAGNOSIS — L0291 Cutaneous abscess, unspecified: Secondary | ICD-10-CM | POA: Diagnosis not present

## 2020-08-29 DIAGNOSIS — G8929 Other chronic pain: Secondary | ICD-10-CM

## 2020-08-29 DIAGNOSIS — M542 Cervicalgia: Secondary | ICD-10-CM | POA: Diagnosis not present

## 2020-08-29 DIAGNOSIS — R454 Irritability and anger: Secondary | ICD-10-CM

## 2020-08-29 NOTE — Patient Instructions (Signed)
A referral was placed today to general surgery for the back abscess.

## 2020-08-31 ENCOUNTER — Other Ambulatory Visit: Payer: Self-pay

## 2020-08-31 ENCOUNTER — Encounter: Payer: Self-pay | Admitting: Surgery

## 2020-08-31 ENCOUNTER — Ambulatory Visit (INDEPENDENT_AMBULATORY_CARE_PROVIDER_SITE_OTHER): Payer: Medicaid Other | Admitting: Surgery

## 2020-08-31 VITALS — BP 168/100 | HR 71 | Temp 97.6°F | Ht 65.0 in | Wt 131.4 lb

## 2020-08-31 DIAGNOSIS — L723 Sebaceous cyst: Secondary | ICD-10-CM

## 2020-08-31 NOTE — Patient Instructions (Signed)
Please see your follow up appointment listed below.  °

## 2020-08-31 NOTE — Progress Notes (Signed)
Patient ID: James Padilla, male   DOB: Jan 07, 1966, 55 y.o.   MRN: [id number]  Chief Complaint:  Left para-scapular abscess.    History of Present Illness James Padilla is a 55 y.o. male with a known cyst of 2 years, becoming abscessed 2 weeks ago.  Spontaneous drainage with treatment with 7 days of Abx.  Now presents with no drainage, lingering tenderness, but no pain. Denies f/C.   Past Medical History Past Medical History:  Diagnosis Date  . Abscess 12/17/2018  . Anxiety   . Depression   . Dyspnea   . Hypertension   . Neck pain   . Weakness of extremity Right arm and hand      Past Surgical History:  Procedure Laterality Date  . COLONOSCOPY WITH PROPOFOL N/A 10/15/2018   Procedure: COLONOSCOPY WITH PROPOFOL;  Surgeon: Jonathon Bellows, MD;  Location: [facility] ENDOSCOPY;  Service: Gastroenterology;  Laterality: N/A;  . MULTIPLE TOOTH EXTRACTIONS    . NO PAST SURGERIES      Allergies  Allergen Reactions  . Septra [Sulfamethoxazole-Trimethoprim] Other (See Comments)    Renal Failure  . Lactose Nausea And Vomiting    Current Outpatient Medications  Medication Sig Dispense Refill  . busPIRone (BUSPAR) 5 MG tablet Take 1 tablet (5 mg total) by mouth daily. 90 tablet 1  . DULoxetine (CYMBALTA) 30 MG capsule Take 1 capsule (30 mg total) by mouth 2 (two) times daily. 180 capsule 1  . gabapentin (NEURONTIN) 300 MG capsule Take 1 capsule (300 mg total) by mouth 2 (two) times daily as needed. 180 capsule 1  . hydrochlorothiazide (HYDRODIURIL) 12.5 MG tablet Take 1 tablet (12.5 mg total) by mouth daily. 90 tablet 1   No current facility-administered medications for this visit.    Family History Family History  Problem Relation Age of Onset  . Breast cancer Mother   . Aneurysm Father       Social History Social History   Tobacco Use  . Smoking status: Current Some Day Smoker    Packs/day: 0.25    Years: 40.00    Pack years: 10.00    Types: Cigarettes  . Smokeless tobacco: Never  Used  . Tobacco comment: currently smoking .25 ppd  Vaping Use  . Vaping Use: Never used  Substance Use Topics  . Alcohol use: Not Currently    Comment: rarely   . Drug use: Yes    Frequency: 2.0 times per week    Types: Marijuana, Cocaine        Review of Systems  Constitutional: Negative.   HENT: Negative.   Eyes: Negative.   Respiratory: Negative.   Cardiovascular: Negative.   Gastrointestinal: Negative.   Genitourinary: Negative.   Skin: Negative.   Neurological: Negative.   Psychiatric/Behavioral: Negative.       Physical Exam Blood pressure (!) 168/100, pulse 71, temperature 97.6 F (36.4 C), temperature source Oral, height 5\' 5"  (1.651 m), weight 131 lb 6.4 oz (59.6 kg), SpO2 97 %. Last Weight  Most recent update: 08/31/2020 11:12 AM   Weight  59.6 kg (131 lb 6.4 oz)            CONSTITUTIONAL: Well developed, and nourished, appropriately responsive and aware without distress.   EYES: Sclera non-icteric.   EARS, NOSE, MOUTH AND THROAT: Mask worn.    Hearing is intact to voice.  NECK: Trachea is midline, and there is no jugular venous distension.  LYMPH NODES:  Lymph nodes in the neck are not enlarged. RESPIRATORY:  Lungs are clear, and breath sounds are equal bilaterally. Normal respiratory effort without pathologic use of accessory muscles. CARDIOVASCULAR: Heart is regular in rate and rhythm. GI: The abdomen is soft, nontender, and nondistended.  MUSCULOSKELETAL:  Symmetrical muscle tone appreciated in all four extremities.    SKIN: Skin turgor is normal. No pathologic skin lesions appreciated.  Left parascapular is a well scarred site, without fluctuance or induration.  Nothing to suggest any lingering abscess, or cellulitis.  There is an adjacent 1 cm dermal lesion c/w another small cyst, but doesn't appear to communicate.  NEUROLOGIC:  Motor and sensation appear grossly normal.  Cranial nerves are grossly without defect. PSYCH:  Alert and oriented to person,  place and time. Affect is appropriate for situation.  Data Reviewed I have personally reviewed what is currently available of the patient's imaging, recent labs and medical records.   Labs:  CBC Latest Ref Rng & Units 03/01/2019 11/10/2018 07/11/2018  WBC 4.0 - 10.5 K/uL 3.9(L) 4.8 4.0  Hemoglobin 13.0 - 17.0 g/dL 62.6 12.7(L) 15.1  Hematocrit 39.0 - 52.0 % 41.3 37.4(L) 44.3  Platelets 150 - 400 K/uL 202 202 158   CMP Latest Ref Rng & Units 03/01/2019 11/10/2018 10/05/2018  Glucose 70 - 99 mg/dL 76 93 80  BUN 6 - 20 mg/dL 9 15 14   Creatinine 0.61 - 1.24 mg/dL ) 9.48(N) 4.62(V)  Sodium 135 - 145 mmol/L 137 138 139  Potassium 3.5 - 5.1 mmol/L 3.8 4.0 4.2  Chloride 98 - 111 mmol/L 103 109 107  CO2 22 - 32 mmol/L 26 23 25   Calcium 8.9 - 10.3 mg/dL 9.0 0.35(K) 9.2  Total Protein 6.1 - 8.1 g/dL - - 7.3  Total Bilirubin 0.2 - 1.2 mg/dL - - 0.9  AST 10 - 35 U/L - - 18  ALT 9 - 46 U/L - - 12      Imaging:  Within last 24 hrs: No results found.  Assessment    Resolved abscessed cyst.  No evidence of persistent infection or cyst.  Patient Active Problem List   Diagnosis Date Noted  . Neuropathy 08/29/2020  . Essential hypertension 01/04/2019  . Crack cocaine use 12/17/2018  . Sebaceous cyst 12/17/2018  . Nonintractable episodic headache 10/05/2018  . Frequency of urination and polyuria 10/05/2018  . Encounter for tobacco use cessation counseling 10/05/2018  . Severe episode of recurrent major depressive disorder, without psychotic features (HCC) 10/05/2018  . Irritability and anger 10/05/2018  . Homelessness 09/19/2018  . Chronic neck pain 09/19/2018  . Chronic bilateral back pain 09/19/2018  . Right arm pain 08/14/2018    Plan    F/u in a month to assess for complete resolution.   Face-to-face time spent with the patient and accompanying care providers(if present) was 25 minutes, with more than 50% of the time spent counseling, educating, and coordinating care of the  patient.      09/21/2018 M.D., FACS 08/31/2020, 11:40 AM

## 2020-09-10 NOTE — Progress Notes (Deleted)
Patient is a 55 year old male Was seen last by me 08/29/20 He was noted to be a difficult historian previously. At our first visit in July visit, he noted there was plans to have a sebaceous cyst in his left shoulder region removed, delayed due to Covid restrictions. At that visit, noted with homelessness in the past, drug use, and noncompliance with medications all concerns..   Was seen 08/29/20 with recurrence of the boil/abscess on his back.  It is more raised again, and uncomfortable at times.  Noted continuing to recur in that same area where it has happened previously.. Saw general surgery 08/31/20 and rec'ed f/u again in one month as was noted to be draining prior to that visit.  HTN - His BP was up some last couple visits, questioned compliance with med and also pain possibly contributing some. He states he is taking his medicine now for his blood pressure, hydrochlorothiazide, daily. BP Readings from Last 3 Encounters:  08/31/20 (!) 168/100  08/29/20 (!) 180/100  06/27/20 (!) 160/110

## 2020-09-14 ENCOUNTER — Ambulatory Visit: Payer: Medicaid Other | Admitting: Internal Medicine

## 2020-09-15 ENCOUNTER — Ambulatory Visit: Payer: Self-pay | Admitting: *Deleted

## 2020-09-15 NOTE — Telephone Encounter (Signed)
Four attempts were made to return pt's call to [phone] without success.   Get a message "No calls are being accepted at this time".

## 2020-09-21 ENCOUNTER — Ambulatory Visit: Payer: Medicaid Other | Admitting: Internal Medicine

## 2020-10-02 NOTE — Progress Notes (Signed)
Name: James Padilla   MRN: [id number]    DOB: [birth year]   Date:10/04/2020       Progress Note  Subjective  Chief Complaint  Follow up (Former patient of Dr.Hendrickson)  HPI  HTN: he states he ran out of HCTZ about one week ago, he denies using crack cocaine in the past week, bp is high today, because of use of cocaine I will switch from hctz to lisinopril hctz since bp above 150 . Atenolol would be good because of history of cocaine use but he has emphysema. Explained importance of avoiding drugs of abuse  Atherosclerosis of aorta: he is not on statin therapy, discussed CT report   CAD : he has intermittent sub-sternal chest pain , sometimes it is described as tightness. Discussed adding statin therapy and aspirin to his regiment and referral to cardiologist   Emphysema: he is a smoker, not ready to quit smoking, he has morning cough sometimes it is productive, he denies SOB  Family history of brain aneurysm: brother and father, he has intermittent dizziness, headaches that are frequent, currently it is a dull ache, but happens frequently and is worried about aneurysm    Chronic neck and back pain: he has a long history of neuropathy, he has tingling on both arms and some myoclonus. He states has difficulty working due to the pain, waiting for disability, lives in friend's house, sleep on a sofa.   Depression: he cannot afford his medication, he has rx of buspar and duloxetine but not taking it, he states friends have called him bipolar, he gets agitated at times, has irritability, sometimes goes days without sleeping . He may have bipolar disorder   Patient Active Problem List   Diagnosis Date Noted  . Neuropathy 08/29/2020  . Essential hypertension 01/04/2019  . Crack cocaine use 12/17/2018  . Sebaceous cyst 12/17/2018  . Nonintractable episodic headache 10/05/2018  . Frequency of urination and polyuria 10/05/2018  . Encounter for tobacco use cessation counseling 10/05/2018  .  Severe episode of recurrent major depressive disorder, without psychotic features ([place]) 10/05/2018  . Irritability and anger 10/05/2018  . Homelessness 09/19/2018  . Chronic neck pain 09/19/2018  . Chronic bilateral back pain 09/19/2018  . Right arm pain 08/14/2018    Past Surgical History:  Procedure Laterality Date  . COLONOSCOPY WITH PROPOFOL N/A 10/15/2018   Procedure: COLONOSCOPY WITH PROPOFOL;  Surgeon: Jonathon Bellows, MD;  Location: [facility] ENDOSCOPY;  Service: Gastroenterology;  Laterality: N/A;  . MULTIPLE TOOTH EXTRACTIONS    . NO PAST SURGERIES      Family History  Problem Relation Age of Onset  . Breast cancer Mother   . Aneurysm Father     Social History   Tobacco Use  . Smoking status: Current Some Day Smoker    Packs/day: 0.25    Years: 40.00    Pack years: 10.00    Types: Cigarettes  . Smokeless tobacco: Never Used  . Tobacco comment: currently smoking .25 ppd  Substance Use Topics  . Alcohol use: Not Currently    Comment: rarely      Current Outpatient Medications:  .  busPIRone (BUSPAR) 5 MG tablet, Take 1 tablet (5 mg total) by mouth daily., Disp: 90 tablet, Rfl: 1 .  DULoxetine (CYMBALTA) 30 MG capsule, Take 1 capsule (30 mg total) by mouth 2 (two) times daily., Disp: 180 capsule, Rfl: 1 .  gabapentin (NEURONTIN) 300 MG capsule, Take 1 capsule (300 mg total) by mouth 2 (two) times daily  as needed., Disp: 180 capsule, Rfl: 1 .  hydrochlorothiazide (HYDRODIURIL) 12.5 MG tablet, Take 1 tablet (12.5 mg total) by mouth daily., Disp: 90 tablet, Rfl: 1  Allergies  Allergen Reactions  . Septra [Sulfamethoxazole-Trimethoprim] Other (See Comments)    Renal Failure  . Lactose Nausea And Vomiting    I personally reviewed active problem list, medication list, allergies, family history, social history, health maintenance with the patient/caregiver today.   ROS  Ten systems reviewed and is negative except as mentioned in HPI   Objective  Vitals:   10/04/20  1411 10/04/20 1426  BP: (!) 152/86 (!) 150/78  Pulse: 88   Resp: 16   Temp: 98 F (36.7 C)   TempSrc: Oral   SpO2: 98%   Weight: 133 lb 1.6 oz (60.4 kg)   Height: 5\' 5"  (1.651 m)     Body mass index is 22.15 kg/m.  Physical Exam  Constitutional: Patient appears well-developed and well-nourished. No distress.  HEENT: head atraumatic, normocephalic, pupils equal and reactive to light,  neck supple, Cardiovascular: Normal rate, regular rhythm and normal heart sounds.  No murmur heard. No BLE edema. Pulmonary/Chest: Effort normal and breath sounds normal. No respiratory distress. Abdominal: Soft.  There is no tenderness. Muscular skeletal: pain during palpation of lumbar spine, decrease rom of cervical spine, no synoviits Psychiatric: Patient has a normal mood and affect. behavior is normal. Judgment and thought content normal.  PHQ2/9: Depression screen [facility] 2/9 10/04/2020 08/29/2020 06/27/2020 03/16/2020 04/06/2019  Decreased Interest 2 1 2  0 1  Down, Depressed, Hopeless 2 1 2 3 1   PHQ - 2 Score 4 2 4 3 2   Altered sleeping 1 1 3 1  0  Tired, decreased energy 1 1 2  0 1  Change in appetite 0 0 2 0 0  Feeling bad or failure about yourself  1 1 1 1 1   Trouble concentrating 1 0 1 1 1   Moving slowly or fidgety/restless 0 0 1 0 0  Suicidal thoughts 0 0 0 0 0  PHQ-9 Score 8 5 - 6 5  Difficult doing work/chores - Somewhat difficult Somewhat difficult Somewhat difficult Somewhat difficult  Some recent data might be hidden    phq 9 is positive   Fall Risk: Fall Risk  10/04/2020 08/31/2020 08/29/2020 06/27/2020 03/16/2020  Falls in the past year? 1 0 1 1 0  Number falls in past yr: 1 - 1 1 0  Injury with Fall? 0 - 0 0 0  Risk for fall due to : - - - - -  Follow up - - - - -     Functional Status Survey: Is the patient deaf or have difficulty hearing?: No Does the patient have difficulty seeing, even when wearing glasses/contacts?: Yes Does the patient have difficulty concentrating,  remembering, or making decisions?: No Does the patient have difficulty walking or climbing stairs?: Yes Does the patient have difficulty dressing or bathing?: Yes Does the patient have difficulty doing errands alone such as visiting a doctor's office or shopping?: No    Assessment & Plan  1. Uncontrolled hypertension  - COMPLETE METABOLIC PANEL WITH GFR - CBC with Differential/Platelet  2. Crack cocaine use  Discussed importance of quitting   3. Atherosclerosis of aorta (HCC)  - Lipid panel  4. Centrilobular emphysema ([place])  He is afraid of cost of medication   5. Neuropathy  - Vitamin B12  6. Mild episode of recurrent major depressive disorder ([place])   7. Chronic bilateral back pain,  unspecified back location   8. Chronic neck pain  - gabapentin (NEURONTIN) 300 MG capsule; Take 1 capsule (300 mg total) by mouth 3 (three) times daily.  Dispense: 270 capsule; Refill: 0  9. Need for vaccination for pneumococcus  - Pneumococcal polysaccharide vaccine 23-valent greater than or equal to 2yo subcutaneous/IM  10. Family history of cerebral aneurysm  - MR BRAIN W WO CONTRAST; Future  11. Worsening headaches  - MR BRAIN W WO CONTRAST; Future  12. Irritability and anger  - divalproex (DEPAKOTE) 250 MG DR tablet; Take 1 tablet (250 mg total) by mouth in the morning and at bedtime. For mood  Dispense: 180 tablet; Refill: 0  13. CAD in native artery  Discussed referral to cardiologist, but he wants to try medication first, and monitor

## 2020-10-04 ENCOUNTER — Other Ambulatory Visit: Payer: Self-pay

## 2020-10-04 ENCOUNTER — Encounter: Payer: Self-pay | Admitting: Family Medicine

## 2020-10-04 ENCOUNTER — Ambulatory Visit: Payer: Medicaid Other | Admitting: Family Medicine

## 2020-10-04 VITALS — BP 150/78 | HR 88 | Temp 98.0°F | Resp 16 | Ht 65.0 in | Wt 133.1 lb

## 2020-10-04 DIAGNOSIS — I700 Atherosclerosis of aorta: Secondary | ICD-10-CM | POA: Insufficient documentation

## 2020-10-04 DIAGNOSIS — G629 Polyneuropathy, unspecified: Secondary | ICD-10-CM

## 2020-10-04 DIAGNOSIS — I10 Essential (primary) hypertension: Secondary | ICD-10-CM | POA: Diagnosis not present

## 2020-10-04 DIAGNOSIS — R519 Headache, unspecified: Secondary | ICD-10-CM

## 2020-10-04 DIAGNOSIS — R454 Irritability and anger: Secondary | ICD-10-CM

## 2020-10-04 DIAGNOSIS — J432 Centrilobular emphysema: Secondary | ICD-10-CM | POA: Diagnosis not present

## 2020-10-04 DIAGNOSIS — I2510 Atherosclerotic heart disease of native coronary artery without angina pectoris: Secondary | ICD-10-CM | POA: Insufficient documentation

## 2020-10-04 DIAGNOSIS — F330 Major depressive disorder, recurrent, mild: Secondary | ICD-10-CM

## 2020-10-04 DIAGNOSIS — Z23 Encounter for immunization: Secondary | ICD-10-CM | POA: Diagnosis not present

## 2020-10-04 DIAGNOSIS — F1490 Cocaine use, unspecified, uncomplicated: Secondary | ICD-10-CM

## 2020-10-04 DIAGNOSIS — M549 Dorsalgia, unspecified: Secondary | ICD-10-CM

## 2020-10-04 DIAGNOSIS — Z8249 Family history of ischemic heart disease and other diseases of the circulatory system: Secondary | ICD-10-CM

## 2020-10-04 DIAGNOSIS — M542 Cervicalgia: Secondary | ICD-10-CM

## 2020-10-04 DIAGNOSIS — G8929 Other chronic pain: Secondary | ICD-10-CM

## 2020-10-04 MED ORDER — DIVALPROEX SODIUM 250 MG PO DR TAB: 250.0000 mg | DELAYED_RELEASE_TABLET | Freq: Two times a day (BID) | ORAL | 0 refills | Status: DC

## 2020-10-04 MED ORDER — ATORVASTATIN CALCIUM 20 MG PO TABS: 20.0000 mg | ORAL_TABLET | Freq: Every day | ORAL | 1 refills | Status: DC

## 2020-10-04 MED ORDER — LOSARTAN POTASSIUM-HCTZ 50-12.5 MG PO TABS: 1.0000 | ORAL_TABLET | Freq: Every day | ORAL | 0 refills | Status: DC

## 2020-10-04 MED ORDER — GABAPENTIN 300 MG PO CAPS: 300.0000 mg | ORAL_CAPSULE | Freq: Three times a day (TID) | ORAL | 0 refills | Status: DC

## 2020-10-04 MED ORDER — ASPIRIN EC 81 MG PO TBEC: 81.0000 mg | DELAYED_RELEASE_TABLET | Freq: Every day | ORAL | 0 refills | Status: DC

## 2020-10-05 ENCOUNTER — Ambulatory Visit: Payer: Medicaid Other | Admitting: Surgery

## 2020-10-05 ENCOUNTER — Encounter: Payer: Self-pay | Admitting: Surgery

## 2020-10-05 VITALS — BP 162/89 | HR 68 | Temp 97.7°F | Ht 66.0 in | Wt 129.2 lb

## 2020-10-05 DIAGNOSIS — L723 Sebaceous cyst: Secondary | ICD-10-CM | POA: Diagnosis not present

## 2020-10-05 LAB — CBC WITH DIFFERENTIAL/PLATELET
Absolute Monocytes: 286 cells/uL (ref 200–950)
Basophils Absolute: 31 cells/uL (ref 0–200)
Basophils Relative: 0.6 %
Eosinophils Absolute: 78 cells/uL (ref 15–500)
Eosinophils Relative: 1.5 %
HCT: 41 % (ref 38.5–50.0)
Hemoglobin: 14.2 g/dL (ref 13.2–17.1)
Lymphs Abs: 1934 cells/uL (ref 850–3900)
MCH: 34.8 pg — ABNORMAL HIGH (ref 27.0–33.0)
MCHC: 34.6 g/dL (ref 32.0–36.0)
MCV: 100.5 fL — ABNORMAL HIGH (ref 80.0–100.0)
MPV: 9.5 fL (ref 7.5–12.5)
Monocytes Relative: 5.5 %
Neutro Abs: 2870 cells/uL (ref 1500–7800)
Neutrophils Relative %: 55.2 %
Platelets: 173 10*3/uL (ref 140–400)
RBC: 4.08 10*6/uL — ABNORMAL LOW (ref 4.20–5.80)
RDW: 12.7 % (ref 11.0–15.0)
Total Lymphocyte: 37.2 %
WBC: 5.2 10*3/uL (ref 3.8–10.8)

## 2020-10-05 LAB — LIPID PANEL
Cholesterol: 123 mg/dL (ref <200)
HDL: 77 mg/dL (ref >=40)
LDL Cholesterol (Calc): 31 mg/dL (calc)
Non-HDL Cholesterol (Calc): 46 mg/dL (calc) (ref <130)
Total CHOL/HDL Ratio: 1.6 (calc) (ref <5.0)
Triglycerides: 69 mg/dL (ref <150)

## 2020-10-05 LAB — COMPLETE METABOLIC PANEL WITH GFR
AG Ratio: 1.7 (calc) (ref 1.0–2.5)
ALT: 17 U/L (ref 9–46)
AST: 19 U/L (ref 10–35)
Albumin: 4.5 g/dL (ref 3.6–5.1)
Alkaline phosphatase (APISO): 76 U/L (ref 35–144)
BUN/Creatinine Ratio: 9 (calc) (ref 6–22)
BUN: 13 mg/dL (ref 7–25)
CO2: 27 mmol/L (ref 20–32)
Calcium: 9.2 mg/dL (ref 8.6–10.3)
Chloride: 108 mmol/L (ref 98–110)
Creat: 1.44 mg/dL — ABNORMAL HIGH (ref 0.70–1.33)
GFR, Est African American: 62 mL/min/{1.73_m2} (ref >=60)
GFR, Est Non African American: 54 mL/min/{1.73_m2} — ABNORMAL LOW (ref >=60)
Globulin: 2.7 g/dL (calc) (ref 1.9–3.7)
Glucose, Bld: 95 mg/dL (ref 65–99)
Potassium: 4 mmol/L (ref 3.5–5.3)
Sodium: 139 mmol/L (ref 135–146)
Total Bilirubin: 0.8 mg/dL (ref 0.2–1.2)
Total Protein: 7.2 g/dL (ref 6.1–8.1)

## 2020-10-05 LAB — VITAMIN B12: Vitamin B-12: 365 pg/mL (ref 200–1100)

## 2020-10-05 NOTE — Patient Instructions (Signed)
If you have any concerns and questions, please feel free to call our office. See follow up appointment below.

## 2020-10-05 NOTE — Progress Notes (Signed)
Surgical Clinic Progress/Follow-up Note   HPI:  55 y.o. Male presents to clinic for back cyst follow-up, finished course of Abx, has intermittent discharge.  Denies current pain and tenderness.  Wants to have it excised to prevent further exacerbation.  Denies N/V, fever/chills, CP, or SOB.  Review of Systems:  Constitutional: denies fever/chills  ENT: denies sore throat, hearing problems  Respiratory: denies shortness of breath, wheezing  Cardiovascular: denies chest pain, palpitations  Gastrointestinal: denies abdominal pain, N/V, or diarrhea/and bowel function as per interval history Skin: Denies any other rashes or skin discolorations  Vital Signs:  BP (!) 162/89   Pulse 68   Temp 97.7 F (36.5 C) (Oral)   Ht 5\' 6"  (1.676 m)   Wt 129 lb 3.2 oz (58.6 kg)   SpO2 99%   BMI 20.85 kg/m    Physical Exam:  Constitutional:  -- Normal body habitus  -- Awake, alert, and oriented x3  Pulmonary:  -- No crackles -- Equal breath sounds bilaterally -- Breathing non-labored at rest Cardiovascular:  -- S1, S2 present  -- No pericardial rubs  Gastrointestinal:  -- Soft and non-distended, non-tender/with no tenderness to palpation, no guarding/rebound tenderness Musculoskeletal / Integumentary:  -- Wounds or skin discoloration: left parascapular dermal cyst, draining sebum with minimal manipulation, no tenderness, no inflammation or induration.  -- Extremities: B/L UE and LE FROM, hands and feet warm, no edema   Laboratory studies: none  Imaging: No new pertinent imaging available for review   Assessment:  55 y.o. yo Male with a problem list including...  Patient Active Problem List   Diagnosis Date Noted  . Centrilobular emphysema ([place]) 10/04/2020  . Atherosclerosis of aorta ([place]) 10/04/2020  . CAD in native artery 10/04/2020  . Neuropathy 08/29/2020  . Essential hypertension 01/04/2019  . Crack cocaine use 12/17/2018  . Sebaceous cyst 12/17/2018  . Nonintractable episodic  headache 10/05/2018  . Frequency of urination and polyuria 10/05/2018  . Encounter for tobacco use cessation counseling 10/05/2018  . Severe episode of recurrent major depressive disorder, without psychotic features ([place]) 10/05/2018  . Irritability and anger 10/05/2018  . Homelessness 09/19/2018  . Chronic neck pain 09/19/2018  . Chronic bilateral back pain 09/19/2018  . Right arm pain 08/14/2018    presents to clinic for follow-up evaluation of cyst for excision, progressing well.  Plan:              - return to clinic for excision of back dermal cyst, next week.   All of the above recommendations were discussed with the patient, and all of patient's questions were answered to his expressed satisfaction.  Ronny Bacon, MD, FACS [place]: [place] [facility] exceptional care. Office: [phone]

## 2020-10-10 ENCOUNTER — Ambulatory Visit: Payer: Medicaid Other | Admitting: Surgery

## 2020-10-10 ENCOUNTER — Other Ambulatory Visit: Payer: Self-pay

## 2020-10-10 ENCOUNTER — Other Ambulatory Visit: Payer: Self-pay | Admitting: Surgery

## 2020-10-10 ENCOUNTER — Encounter: Payer: Self-pay | Admitting: Surgery

## 2020-10-10 VITALS — BP 175/96 | HR 75 | Temp 98.5°F | Ht 65.0 in | Wt 130.2 lb

## 2020-10-10 DIAGNOSIS — L723 Sebaceous cyst: Secondary | ICD-10-CM | POA: Diagnosis not present

## 2020-10-10 NOTE — Progress Notes (Signed)
Excision sebaceous cyst left parascapular area.  Pre-operative Diagnosis: Sebaceous cyst.  Post-operative Diagnosis: same.   Surgeon: Ronny Bacon, M.D., FACS  Anesthesia: Local  Findings: Small subcentimeter cyst, associated with extensive scarring from prior inflammation/infection.  Estimated Blood Loss: 3 mL         Specimens: Skin, left parascapular area          Complications: none              Procedure Details      After the induction, the patient was positioned in the prone position and the left parascapular area was prepped with Chloraprep and draped in the sterile fashion.  A Time Out was held and the above information confirmed.  Local infiltration with 1% lidocaine with epinephrine is utilized.  Elliptical incision is made to excise the currently draining office and the adjacent punctum anticipated cyst.  Deep dermal excision of the cyst is completed.  The skin is then reapproximated with interrupted 3-0 Vicryl, a running 4-0 Monocryl subcuticular is applied to reapproximate the epidermis.  Dermabond is applied to the skin.  He tolerated the procedure well.  Estimate size of the cyst itself is 1 cm, area of dermal excision is 2.5 cm.      Ronny Bacon M.D., [facility] 10/10/2020 3:18 PM

## 2020-10-10 NOTE — Patient Instructions (Signed)
Try ibuprofen and Tylenol for the discomfort. Please keep an ice pack on the area throughout the day today.    We have removed a Cyst in our office today.  You have sutures under the skin that will dissolve and also dermabond (skin glue) on top of your skin which will come off on it's own in 10-14 days.  You may shower in 48 hours, this is on 10/12/2020.  Avoid Strenuous activities that will make you sweat during the next 48 hours to avoid the glue coming off prematurely. Avoid activities that will place pressure to this area of the body for 1-2 weeks to avoid re-injury to incision site.  Please see your follow-up appointment provided. We will see you back in office to make sure this area is healed and to review the final pathology. If you have any questions or concerns prior to this appointment, call our office and speak with a nurse.    Excision of Skin Cysts or Lesions Excision of a skin lesion refers to the removal of a section of skin by making small cuts (incisions) in the skin. This procedure may be done to remove a cancerous (malignant) or noncancerous (benign) growth on the skin. It is typically done to treat or prevent cancer or infection. It may also be done to improve cosmetic appearance. The procedure may be done to remove:  Cancerous growths, such as basal cell carcinoma, squamous cell carcinoma, or melanoma.  Noncancerous growths, such as a cyst or lipoma.  Growths, such as moles or skin tags, which may be removed for cosmetic reasons.  Various excision or surgical techniques may be used depending on your condition, the location of the lesion, and your overall health. Tell a health care provider about:  Any allergies you have.  All medicines you are taking, including vitamins, herbs, eye drops, creams, and over-the-counter medicines.  Any problems you or family members have had with anesthetic medicines.  Any blood disorders you have.  Any surgeries you have  had.  Any medical conditions you have.  Whether you are pregnant or may be pregnant. What are the risks? Generally, this is a safe procedure. However, problems may occur, including:  Bleeding.  Infection.  Scarring.  Recurrence of the cyst, lipoma, or cancer.  Changes in skin sensation or appearance, such as discoloration or swelling.  Reaction to the anesthetics.  Allergic reaction to surgical materials or ointments.  Damage to nerves, blood vessels, muscles, or other structures.  Continued pain.  What happens before the procedure?  Ask your health care provider about: ? Changing or stopping your regular medicines. This is especially important if you are taking diabetes medicines or blood thinners. ? Taking medicines such as aspirin and ibuprofen. These medicines can thin your blood. Do not take these medicines before your procedure if your health care provider instructs you not to.  You may be asked to take certain medicines.  You may be asked to stop smoking.  You may have an exam or testing.  Plan to have someone take you home after the procedure.  Plan to have someone help you with activities during recovery. What happens during the procedure?  To reduce your risk of infection: ? Your health care team will wash or sanitize their hands. ? Your skin will be washed with soap.  You will be given a medicine to numb the area (local anesthetic).  One of the following excision techniques will be performed.  At the end of any of these  procedures, antibiotic ointment will be applied as needed. Each of the following techniques may vary among health care providers and hospitals. Complete Surgical Excision The area of skin that needs to be removed will be marked with a pen. Using a small scalpel or scissors, the surgeon will gently cut around and under the lesion until it is completely removed. The lesion will be placed in a fluid and sent to the lab for examination. If  necessary, bleeding will be controlled with a device that delivers heat (electrocautery). The edges of the wound may be stitched (sutured) together, and a bandage (dressing) will be applied. This procedure may be performed to treat a cancerous growth or a noncancerous cyst or lesion. Excision of a Cyst The surgeon will make an incision on the cyst. The entire cyst will be removed through the incision. The incision may be closed with sutures. Shave Excision During shave excision, the surgeon will use a small blade or an electrically heated loop instrument to shave off the lesion. This may be done to remove a mole or a skin tag. The wound will usually be left to heal on its own without sutures. Punch Excision During punch excision, the surgeon will use a small tool that is like a cookie cutter or a hole punch to cut a circle shape out of the skin. The outer edges of the skin will be sutured together. This may be done to remove a mole or a scar or to perform a biopsy of the lesion. Mohs Micrographic Surgery During Mohs micrographic surgery, layers of the lesion will be removed with a scalpel or a loop instrument and will be examined right away under a microscope. Layers will be removed until all of the abnormal or cancerous tissue has been removed. This procedure is minimally invasive, and it ensures the best cosmetic outcome. It involves the removal of as little normal tissue as possible. Mohs is usually done to treat skin cancer, such as basal cell carcinoma or squamous cell carcinoma, particularly on the face and ears. Depending on the size of the surgical wound, it may be sutured closed. What happens after the procedure?  Return to your normal activities as told by your health care provider.  Talk with your health care provider to discuss any test results, treatment options, and if necessary, the need for more tests. This information is not intended to replace advice given to you by your health care  provider. Make sure you discuss any questions you have with your health care provider. Document Released: 11/06/2009 Document Revised: 01/18/2016 Document Reviewed: 09/28/2014 Elsevier Interactive Patient Education  [employer].

## 2020-10-23 ENCOUNTER — Telehealth: Payer: Self-pay

## 2020-10-23 ENCOUNTER — Other Ambulatory Visit: Payer: Self-pay | Admitting: Family Medicine

## 2020-10-23 MED ORDER — ANORO ELLIPTA 62.5-25 MCG/INH IN AEPB: 1.0000 | INHALATION_SPRAY | Freq: Every day | RESPIRATORY_TRACT | 2 refills | Status: DC

## 2020-10-23 NOTE — Telephone Encounter (Signed)
I contacted this patient to inform him that he has been scheduled to have his MRI Brain on 11/01/20 @ 7pm at [facility] and to arrive at least 30 mins early.   He agreed but then stated that he needed to send Dr. Ancil Boozer a message. He stated that he is now having the sx (cough) she asked him about during the previous visit and would like something called in. The cough is dry, no SOB, No chest pain - lots of coughing. Has not been able to do much but lay around all day.  He believes that she mentioned trying an inhaler. I confirmed that he still uses [facility] on [address].  Please reach out to patient at your earliest convenience.   Thanks  [employer]

## 2020-11-01 ENCOUNTER — Ambulatory Visit
Admission: RE | Admit: 2020-11-01 | Discharge: 2020-11-01 | Disposition: A | Discharge status: Home / Self Care | Payer: Medicaid Other | Source: Ambulatory Visit | Attending: Family Medicine | Admitting: Family Medicine

## 2020-11-01 ENCOUNTER — Other Ambulatory Visit: Payer: Self-pay

## 2020-11-01 DIAGNOSIS — R519 Headache, unspecified: Secondary | ICD-10-CM | POA: Insufficient documentation

## 2020-11-01 DIAGNOSIS — Z8249 Family history of ischemic heart disease and other diseases of the circulatory system: Secondary | ICD-10-CM | POA: Diagnosis not present

## 2020-11-01 DIAGNOSIS — I6381 Other cerebral infarction due to occlusion or stenosis of small artery: Secondary | ICD-10-CM | POA: Diagnosis not present

## 2020-11-01 DIAGNOSIS — J3489 Other specified disorders of nose and nasal sinuses: Secondary | ICD-10-CM | POA: Diagnosis not present

## 2020-11-01 MED ORDER — GADOBUTROL 1 MMOL/ML IV SOLN
5.0000 mL | Freq: Once | INTRAVENOUS | Status: AC | PRN
  Administered 2020-11-01: 5 mL via INTRAVENOUS

## 2020-11-02 ENCOUNTER — Telehealth: Payer: Self-pay

## 2020-11-02 NOTE — Telephone Encounter (Signed)
Copied from [place] [id number]. Topic: General - Other >> Nov 02, 2020  2:29 PM Pawlus, Brayton Layman A wrote: Reason for CRM: Pt was calling to go over his MRI results. Please call back.

## 2020-11-03 NOTE — Telephone Encounter (Signed)
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

## 2020-11-03 NOTE — Telephone Encounter (Signed)
Left detailed vm °

## 2020-11-08 NOTE — Progress Notes (Signed)
Name: James Padilla   MRN: [id number]    DOB: 04/01/1966   Date:11/09/2020       Progress Note  Subjective  Chief Complaint  Follow Up  HPI  HTN: he is now on losartan hctz and it was filled by the pharmacy, he states he has been taking it daily, bp has improved, he is willing to take Atenolol low dose at night.  Explained importance of avoiding drugs of abuse. He denies chest pain  Centribular emphysema: he states he has been thinking about his symptoms and states cough and sob worse in the mornings and at night. He has been using Anoro once a day but does not seem to be helping much   Chronic neck and back pain: he has a long history of neuropathy, he has tingling on both arms and some myoclonus. He states has difficulty working due to the pain, waiting for disability, lives in friend's house, sleeps on a sofa. He had neck surgery scheduled for Summer 2020 but cancelled once due to COVID-19 and once due to positive drug screen for crack cocaine. He is still doing drugs , he states dismissed from Dr. Cari Caraway office   Patient Active Problem List   Diagnosis Date Noted  . Centrilobular emphysema ([place]) 10/04/2020  . Atherosclerosis of aorta ([place]) 10/04/2020  . CAD in native artery 10/04/2020  . Neuropathy 08/29/2020  . Essential hypertension 01/04/2019  . Crack cocaine use 12/17/2018  . Sebaceous cyst 12/17/2018  . Nonintractable episodic headache 10/05/2018  . Frequency of urination and polyuria 10/05/2018  . Encounter for tobacco use cessation counseling 10/05/2018  . Severe episode of recurrent major depressive disorder, without psychotic features ([place]) 10/05/2018  . Irritability and anger 10/05/2018  . Homelessness 09/19/2018  . Chronic neck pain 09/19/2018  . Chronic bilateral back pain 09/19/2018  . Right arm pain 08/14/2018    Past Surgical History:  Procedure Laterality Date  . COLONOSCOPY WITH PROPOFOL N/A 10/15/2018   Procedure: COLONOSCOPY WITH PROPOFOL;  Surgeon:  Jonathon Bellows, MD;  Location: [facility] ENDOSCOPY;  Service: Gastroenterology;  Laterality: N/A;  . MULTIPLE TOOTH EXTRACTIONS      Family History  Problem Relation Age of Onset  . Breast cancer Mother   . Aneurysm Father     Social History   Tobacco Use  . Smoking status: Current Some Day Smoker    Packs/day: 0.25    Years: 40.00    Pack years: 10.00    Types: Cigarettes  . Smokeless tobacco: Never Used  . Tobacco comment: currently smoking .25 ppd  Substance Use Topics  . Alcohol use: Not Currently    Comment: rarely      Current Outpatient Medications:  .  aspirin EC 81 MG tablet, Take 1 tablet (81 mg total) by mouth daily., Disp: 30 tablet, Rfl: 0 .  atenolol (TENORMIN) 25 MG tablet, Take 1 tablet (25 mg total) by mouth every evening., Disp: 90 tablet, Rfl: 0 .  atorvastatin (LIPITOR) 20 MG tablet, Take 1 tablet (20 mg total) by mouth daily. For your heart, Disp: 90 tablet, Rfl: 1 .  divalproex (DEPAKOTE) 250 MG DR tablet, Take 1 tablet (250 mg total) by mouth in the morning and at bedtime. For mood, Disp: 180 tablet, Rfl: 0 .  gabapentin (NEURONTIN) 300 MG capsule, Take 1 capsule (300 mg total) by mouth 3 (three) times daily., Disp: 270 capsule, Rfl: 0 .  losartan-hydrochlorothiazide (HYZAAR) 50-12.5 MG tablet, Take 1 tablet by mouth daily. For blood pressure, Disp:  90 tablet, Rfl: 0 .  umeclidinium-vilanterol (ANORO ELLIPTA) 62.5-25 MCG/INH AEPB, Inhale 1 puff into the lungs daily., Disp: 60 each, Rfl: 2 Allergies  Allergen Reactions  . Septra [Sulfamethoxazole-Trimethoprim] Other (See Comments)    Renal Failure  . Lactose Nausea And Vomiting    I personally reviewed active problem list, medication list, allergies, family history, social history with the patient/caregiver today.   ROS  Ten systems reviewed and is negative except as mentioned in HPI  Objective  Vitals:   11/09/20 1013 11/09/20 1019  BP: (!) 150/90 (!) 142/92  Pulse: 84   Resp: 16   Temp: 97.7 F  (36.5 C)   TempSrc: Oral   SpO2: 99%   Weight: 132 lb (59.9 kg)   Height: 5\' 5"  (1.651 m)     Body mass index is 21.97 kg/m.  Physical Exam  Constitutional: Patient appears well-developed , thin  No distress.  HEENT: head atraumatic, normocephalic, pupils equal and reactive to light neck supple Cardiovascular: Normal rate, regular rhythm and normal heart sounds.  No murmur heard. No BLE edema. Pulmonary/Chest: Effort normal and breath sounds normal. No respiratory distress. Abdominal: Soft.  There is no tenderness. Psychiatric: cooperative, alert,   Recent Results (from the past 2160 hour(s))  COMPLETE METABOLIC PANEL WITH GFR     Status: Abnormal   Collection Time: 10/04/20  3:26 PM  Result Value Ref Range   Glucose, Bld 95 65 - 99 mg/dL    Comment: .            Fasting reference interval .    BUN 13 7 - 25 mg/dL   Creat 1.44 (H) 0.70 - 1.33 mg/dL    Comment: For patients >58 years of age, the reference limit for Creatinine is approximately 13% higher for people identified as African-American. .    GFR, Est Non African American 54 (L) > OR = 60 mL/min/1.68m2   GFR, Est African American 62 > OR = 60 mL/min/1.12m2   BUN/Creatinine Ratio 9 6 - 22 (calc)   Sodium 139 135 - 146 mmol/L   Potassium 4.0 3.5 - 5.3 mmol/L   Chloride 108 98 - 110 mmol/L   CO2 27 20 - 32 mmol/L   Calcium 9.2 8.6 - 10.3 mg/dL   Total Protein 7.2 6.1 - 8.1 g/dL   Albumin 4.5 3.6 - 5.1 g/dL   Globulin 2.7 1.9 - 3.7 g/dL (calc)   AG Ratio 1.7 1.0 - 2.5 (calc)   Total Bilirubin 0.8 0.2 - 1.2 mg/dL   Alkaline phosphatase (APISO) 76 35 - 144 U/L   AST 19 10 - 35 U/L   ALT 17 9 - 46 U/L  CBC with Differential/Platelet     Status: Abnormal   Collection Time: 10/04/20  3:26 PM  Result Value Ref Range   WBC 5.2 3.8 - 10.8 Thousand/uL   RBC 4.08 (L) 4.20 - 5.80 Million/uL   Hemoglobin 14.2 13.2 - 17.1 g/dL   HCT 41.0 38.5 - 50.0 %   MCV 100.5 (H) 80.0 - 100.0 fL   MCH 34.8 (H) 27.0 - 33.0 pg    MCHC 34.6 32.0 - 36.0 g/dL   RDW 12.7 11.0 - 15.0 %   Platelets 173 140 - 400 Thousand/uL   MPV 9.5 7.5 - 12.5 fL   Neutro Abs 2,870 1,500 - 7,800 cells/uL   Lymphs Abs 1,934 850 - 3,900 cells/uL   Absolute Monocytes 286 200 - 950 cells/uL   Eosinophils Absolute 78 15 - 500  cells/uL   Basophils Absolute 31 0 - 200 cells/uL   Neutrophils Relative % 55.2 %   Total Lymphocyte 37.2 %   Monocytes Relative 5.5 %   Eosinophils Relative 1.5 %   Basophils Relative 0.6 %  Lipid panel     Status: None   Collection Time: 10/04/20  3:26 PM  Result Value Ref Range   Cholesterol 123 <200 mg/dL   HDL 77 > OR = 40 mg/dL   Triglycerides 69 <150 mg/dL   LDL Cholesterol (Calc) 31 mg/dL (calc)    Comment: Reference range: <100 . Desirable range <100 mg/dL for primary prevention;   <70 mg/dL for patients with CHD or diabetic patients  with > or = 2 CHD risk factors. [name] LDL-C is now calculated using the Martin-Hopkins  calculation, which is a validated novel method providing  better accuracy than the Friedewald equation in the  estimation of LDL-C.  Cresenciano Genre et al. Annamaria Helling. 6333;545(62): 2061-2068  (http://education.[employer].com/faq/FAQ164)    Total CHOL/HDL Ratio 1.6 <5.0 (calc)   Non-HDL Cholesterol (Calc) 46 <130 mg/dL (calc)    Comment: For patients with diabetes plus 1 major ASCVD risk  factor, treating to a non-HDL-C goal of <100 mg/dL  (LDL-C of <70 mg/dL) is considered a therapeutic  option.   Vitamin B12     Status: None   Collection Time: 10/04/20  3:26 PM  Result Value Ref Range   Vitamin B-12 365 200 - 1,100 pg/mL    Comment: . Please Note: Although the reference range for vitamin B12 is 224-110-3870 pg/mL, it has been reported that between 5 and 10% of patients with values between 200 and 400 pg/mL may experience neuropsychiatric and hematologic abnormalities due to occult B12 deficiency; less than 1% of patients with values above 400 pg/mL will have symptoms. .       PHQ2/9: Depression screen [facility] 2/9 11/09/2020 10/04/2020 08/29/2020 06/27/2020 03/16/2020  Decreased Interest 0 2 1 2  0  Down, Depressed, Hopeless 3 2 1 2 3   PHQ - 2 Score 3 4 2 4 3   Altered sleeping 1 1 1 3 1   Tired, decreased energy 1 1 1 2  0  Change in appetite 0 0 0 2 0  Feeling bad or failure about yourself  1 1 1 1 1   Trouble concentrating 0 1 0 1 1  Moving slowly or fidgety/restless 0 0 0 1 0  Suicidal thoughts 0 0 0 0 0  PHQ-9 Score 6 8 5  - 6  Difficult doing work/chores - - Somewhat difficult Somewhat difficult Somewhat difficult  Some recent data might be hidden    phq 9 is positive - grieving, also states needs to find another place to stay    Fall Risk: Fall Risk  11/09/2020 10/10/2020 10/04/2020 08/31/2020 08/29/2020  Falls in the past year? 1 1 1  0 1  Number falls in past yr: 0 - 1 - 1  Injury with Fall? 0 - 0 - 0  Risk for fall due to : - - - - -  Follow up - - - - -      Assessment & Plan  1. Centrilobular emphysema ([place])   2. Chronic neck pain  - AMB Referral to [facility] Coordinaton  3. Cervical myelopathy ([place])  Needs to stop using drugs to be referred again to a neurosurgeon   4. Hypertension, benign  Improved but still not at goal , we will add Atenolol   5. Drug abuse, cocaine type ([place])  -  AMB Referral to [place]  6. Homelessness  - AMB Referral to [place]

## 2020-11-09 ENCOUNTER — Other Ambulatory Visit: Payer: Self-pay

## 2020-11-09 ENCOUNTER — Ambulatory Visit: Payer: Medicaid Other | Admitting: Family Medicine

## 2020-11-09 ENCOUNTER — Encounter: Payer: Self-pay | Admitting: Family Medicine

## 2020-11-09 ENCOUNTER — Telehealth: Payer: Self-pay | Admitting: *Deleted

## 2020-11-09 VITALS — BP 142/92 | HR 84 | Temp 97.7°F | Resp 16 | Ht 65.0 in | Wt 132.0 lb

## 2020-11-09 DIAGNOSIS — J432 Centrilobular emphysema: Secondary | ICD-10-CM | POA: Diagnosis not present

## 2020-11-09 DIAGNOSIS — M542 Cervicalgia: Secondary | ICD-10-CM | POA: Diagnosis not present

## 2020-11-09 DIAGNOSIS — G959 Disease of spinal cord, unspecified: Secondary | ICD-10-CM

## 2020-11-09 DIAGNOSIS — Z5900 Homelessness unspecified: Secondary | ICD-10-CM

## 2020-11-09 DIAGNOSIS — I10 Essential (primary) hypertension: Secondary | ICD-10-CM

## 2020-11-09 DIAGNOSIS — G8929 Other chronic pain: Secondary | ICD-10-CM

## 2020-11-09 DIAGNOSIS — F1410 Cocaine abuse, uncomplicated: Secondary | ICD-10-CM

## 2020-11-09 MED ORDER — ATENOLOL 25 MG PO TABS: 25.0000 mg | ORAL_TABLET | Freq: Every evening | ORAL | 0 refills | Status: DC

## 2020-11-09 NOTE — Chronic Care Management (AMB) (Signed)
   Care Management   Outreach Note  11/09/2020 Name: James Padilla MRN: [id number] DOB: Mar 10, 1966  James Padilla is a 55 y.o. year old male who is a primary care patient of Steele Sizer, MD. I reached out to [name] by phone today in response to a referral sent by [name]'s PCP, Steele Sizer, MD     An unsuccessful telephone outreach was attempted today. The patient was referred to the case management team for assistance with care management and care coordination.   Follow Up Plan: A HIPAA compliant phone message was left for the patient providing contact information and requesting a return call.  The care management team will reach out to the patient again over the next 7 days.  If patient returns call to provider office, please advise to call Franklin Gladys Damme at [place] [facility]

## 2020-11-15 NOTE — Chronic Care Management (AMB) (Signed)
  Care Management   Outreach Note  11/15/2020 Name: James Padilla MRN: [id number] DOB: Oct 30, 1965  Referred by: Steele Sizer, MD Reason for referral : Care Coordination (Initial outreach to schedule referral with Licensed Clinical SW)   A second unsuccessful telephone outreach was attempted today. The patient was referred to the case management team for assistance with care management and care coordination.   Follow Up Plan: A HIPAA compliant phone message was left for the patient providing contact information and requesting a return call.  The care management team will reach out to the patient again over the next 7 days.  If patient returns call to provider office, please advise to call Wise Lysle Morales at [place] [facility]  [facility]

## 2020-11-20 ENCOUNTER — Telehealth: Payer: Self-pay | Admitting: *Deleted

## 2020-11-20 ENCOUNTER — Telehealth: Payer: Medicaid Other | Admitting: *Deleted

## 2020-11-20 NOTE — Chronic Care Management (AMB) (Signed)
  Care Management   Note  11/20/2020 Name: James Padilla MRN: [id number] DOB: [birth year]  James Padilla is a 55 y.o. year old male who is a primary care patient of Steele Sizer, MD. I reached out to [name] by phone today in response to a referral sent by James Padilla's health plan.    [name] was given information about care management services today including:  1. Care management services include personalized support from designated clinical staff supervised by his physician, including individualized plan of care and coordination with other care providers 2. 24/7 contact phone numbers for assistance for urgent and routine care needs. 3. The patient may stop care management services at any time by phone call to the office staff.  Patient agreed to services and verbal consent obtained.   Follow up plan: Telephone appointment with care management team member scheduled for:11/20/2020  [place] [facility]

## 2020-11-20 NOTE — Telephone Encounter (Signed)
  Chronic Care Management   Outreach Note  11/20/2020 Name: James Padilla MRN: [id number] DOB: 22-Jan-1966  Referred by: Steele Sizer, MD Reason for referral : Chronic Care Management   An unsuccessful telephone outreach was attempted today. The patient was referred to the case management team for assistance with care management and care coordination.   Follow Up Plan: The care management team will reach out to the patient again over the next 7 days.   Elliot Gurney, [place] Administrator, [facility] Care Management [phone]

## 2020-11-21 ENCOUNTER — Telehealth: Payer: Self-pay | Admitting: *Deleted

## 2020-11-21 NOTE — Telephone Encounter (Signed)
   11/21/2020  James Padilla 05-10-1966 [id number]  Phone call from patient to re-schedule CM appointment. Appointment rescheduled for 12/01/20 at 2:30pm. Patient requesting assistance with housing.   Elliot Gurney, [place] Administrator, [facility] Center[facility] Care Management [phone]

## 2020-12-01 ENCOUNTER — Ambulatory Visit: Payer: Self-pay | Admitting: *Deleted

## 2020-12-01 DIAGNOSIS — Z5900 Homelessness unspecified: Secondary | ICD-10-CM

## 2020-12-01 DIAGNOSIS — F330 Major depressive disorder, recurrent, mild: Secondary | ICD-10-CM

## 2020-12-01 DIAGNOSIS — F1410 Cocaine abuse, uncomplicated: Secondary | ICD-10-CM

## 2020-12-01 NOTE — Patient Instructions (Signed)
Visit Information  PATIENT GOALS:  Goals Addressed            This Visit's Progress   . Find Help in My Community       Timeframe:  Long-Range Goal Priority:  Medium Start Date:   12/01/20                        Expected End Date:                       Follow Up Date 12/15/20   - begin a notebook of services in my neighborhood or community - call 211 when I need some help - follow-up on any referrals for help I am given - think ahead to make sure my need does not become an emergency - make a note about what I need to have by the phone or take with me, like an identification card or social security number have a back-up plan    Why is this important?    Knowing how and where to find help for yourself or family in your neighborhood and community is an important skill.   You will want to take some steps to learn how.    Notes:        James Padilla was given information about Care Management services by the embedded care coordination team including:  1. Care Management services include personalized support from designated clinical staff supervised by his physician, including individualized plan of care and coordination with other care providers 2. 24/7 contact phone numbers for assistance for urgent and routine care needs. 3. The patient may stop CCM services at any time (effective at the end of the month) by phone call to the office staff.  Patient agreed to services and verbal consent obtained.   The patient verbalized understanding of instructions, educational materials, and care plan provided today and declined offer to receive copy of patient instructions, educational materials, and care plan.   Telephone follow up appointment with care management team member scheduled for:12/15/20  Elliot Gurney, [place] Worker  [place] [facility] [phone]

## 2020-12-01 NOTE — Chronic Care Management (AMB) (Signed)
Care Management Clinical Social Work Note  12/01/2020 Name: James Padilla MRN: [id number] DOB: [birth year]  James Padilla is a 55 y.o. year old male who is a primary care patient of Steele Sizer, MD.  The Care Management team was consulted for assistance with chronic disease management and coordination needs.  Engaged with patient by telephone for follow up visit in response to provider referral for social work chronic care management and care coordination services  Consent to Services:  James Padilla was given information about Care Management services today including:  1. Care Management services includes personalized support from designated clinical staff supervised by his physician, including individualized plan of care and coordination with other care providers 2. 24/7 contact phone numbers for assistance for urgent and routine care needs. 3. The patient may stop case management services at any time by phone call to the office staff.  Patient agreed to services and consent obtained.   Assessment: Review of patient past medical history, allergies, medications, and health status, including review of relevant consultants reports was performed today as part of a comprehensive evaluation and provision of chronic care management and care coordination services.  SDOH (Social Determinants of Health) assessments and interventions performed:    Advanced Directives Status: Not addressed in this encounter.  Care Plan  Allergies  Allergen Reactions  . Septra [Sulfamethoxazole-Trimethoprim] Other (See Comments)    Renal Failure  . Lactose Nausea And Vomiting    Outpatient Encounter Medications as of 12/01/2020  Medication Sig  . aspirin EC 81 MG tablet Take 1 tablet (81 mg total) by mouth daily.  [name] atenolol (TENORMIN) 25 MG tablet Take 1 tablet (25 mg total) by mouth every evening.  [name] atorvastatin (LIPITOR) 20 MG tablet Take 1 tablet (20 mg total) by mouth daily. For your heart  . divalproex  (DEPAKOTE) 250 MG DR tablet Take 1 tablet (250 mg total) by mouth in the morning and at bedtime. For mood  . gabapentin (NEURONTIN) 300 MG capsule Take 1 capsule (300 mg total) by mouth 3 (three) times daily.  [name] losartan-hydrochlorothiazide (HYZAAR) 50-12.5 MG tablet Take 1 tablet by mouth daily. For blood pressure  . umeclidinium-vilanterol (ANORO ELLIPTA) 62.5-25 MCG/INH AEPB Inhale 1 puff into the lungs daily.   No facility-administered encounter medications on file as of 12/01/2020.    Patient Active Problem List   Diagnosis Date Noted  . Centrilobular emphysema ([place]) 10/04/2020  . Atherosclerosis of aorta ([place]) 10/04/2020  . CAD in native artery 10/04/2020  . Neuropathy 08/29/2020  . Essential hypertension 01/04/2019  . Crack cocaine use 12/17/2018  . Sebaceous cyst 12/17/2018  . Nonintractable episodic headache 10/05/2018  . Frequency of urination and polyuria 10/05/2018  . Encounter for tobacco use cessation counseling 10/05/2018  . Severe episode of recurrent major depressive disorder, without psychotic features ([place]) 10/05/2018  . Irritability and anger 10/05/2018  . Homelessness 09/19/2018  . Chronic neck pain 09/19/2018  . Chronic bilateral back pain 09/19/2018  . Right arm pain 08/14/2018    Conditions to be addressed/monitored:  Housing barriers  Care Plan : General Social Work (Adult)  Updates made by [employer], Darla Lesches, LCSW since 12/01/2020 12:00 AM    Problem: CHL AMB "PATIENT-SPECIFIC PROBLEM"   Priority: Medium  Note:   CARE PLAN ENTRY (see longitudinal plan of care for additional care plan information)  Current Barriers:  . Patient with Homelessness in need of assistance with connection to community resources  . Knowledge deficits and need for support, education and  care coordination related to community resources support  . Limited social support and Housing barriers  Clinical Goal(s)  . Over the next 90 days, patient will work with care management team  member to address concerns related to housing needs Interventions provided by LCSW:  . Assessed patient's care coordination needs related to housing and discussed ongoing care management follow up  . Provided patient with information about the [name] as well as an [employer] . Advised patient to consider the above housing options and possibility to work as part of the ongoing program requirement  Patient Self Care Activities & Deficits:  . Patient is unable to independently navigate community resource options without care coordination support  . Acknowledges deficits and is motivated to resolve concern  . Patient will consider an [place] or the [name] as discussed today . Unable to perform IADLs independently . Motivation for treatment          Follow Up Plan: Appointment scheduled for SW follow up with client by phone on: 12/15/20  Elliot Gurney, [place] Worker  [facility] Care Management [phone]

## 2020-12-15 ENCOUNTER — Telehealth: Payer: Self-pay | Admitting: *Deleted

## 2020-12-15 NOTE — Telephone Encounter (Signed)
  Chronic Care Management   Outreach Note  12/15/2020 Name: James Padilla MRN: [id number] DOB: 05-06-1966  Referred by: Steele Sizer, MD Reason for referral : Chronic Care Management   An unsuccessful telephone outreach was attempted today. The patient was referred to the case management team for assistance with care management and care coordination.   Follow Up Plan: Telephone follow up appointment with care management team member scheduled for: 12/25/20  Elliot Gurney, [place] Worker  [facility] Care Management [phone]

## 2020-12-25 ENCOUNTER — Telehealth: Payer: Self-pay | Admitting: *Deleted

## 2020-12-25 ENCOUNTER — Telehealth: Payer: Self-pay

## 2020-12-25 NOTE — Telephone Encounter (Signed)
  Chronic Care Management   Outreach Note  12/25/2020 Name: LENZIE SANDLER MRN: [id number] DOB: September 11, 1965  Referred by: Steele Sizer, MD Reason for referral : Care Coordination   A second unsuccessful telephone outreach was attempted today. The patient was referred to the case management team for assistance with care management and care coordination.   Follow Up Plan: Telephone follow up appointment with care management team member scheduled for: 01/01/21  Elliot Gurney, [place] Worker  [facility] Care Management [phone]

## 2021-01-01 ENCOUNTER — Telehealth: Payer: Self-pay | Admitting: *Deleted

## 2021-01-03 ENCOUNTER — Other Ambulatory Visit: Payer: Self-pay | Admitting: Licensed Clinical Social Worker

## 2021-01-03 NOTE — Patient Instructions (Signed)
Visit Information  Mr. Bogden was given information about Medicaid Managed Care team care coordination services as a part of their Healthy [facility] Medicaid benefit. [name] verbally consented to engagement with the [facility] Managed Care team.   For questions related to your Healthy [facility] health plan, please call: [phone] or visit the homepage here: [email or web address]  If you would like to schedule transportation through your Healthy [facility] plan, please call the following number at least 2 days in advance of your appointment: [phone]   Call the [place] at [phone], at any time, 24 hours a day, 7 days a week. If you are in danger or need immediate medical attention call 911.  Mr. Currier - following are the goals we discussed in your visit today:  Goals Addressed            This Visit's Progress   . Find Help in My Community       Timeframe:  Long-Range Goal Priority:  Medium Start Date:   01/03/21                       Expected End Date: 03/05/21                    Follow Up Date -01/11/21   - begin a notebook of services in my neighborhood or community - call 211 when I need some help - follow-up on any referrals for help I am given - think ahead to make sure my need does not become an emergency - make a note about what I need to have by the phone or take with me, like an identification card or social security number have a back-up plan    Why is this important?    Knowing how and where to find help for yourself or family in your neighborhood and community is an important skill.   You will want to take some steps to learn how.    Notes:        Eula Fried, BSW, MSW, [employer] Managed Medicaid LCSW [email or web address] Phone: [phone]

## 2021-01-03 NOTE — Patient Outreach (Signed)
Medicaid Managed Care Social Work Note  01/03/2021 Name:  James Padilla MRN:  [id number] DOB:  1966-01-16  James Padilla is an 55 y.o. year old male who is a primary patient of Steele Sizer, MD.  The Medicaid Managed Care Coordination team was consulted for assistance with:  [place] and Resources  Mr. Apuzzo was given information about Medicaid Managed CareCoordination services today. [name] agreed to services and verbal consent obtained.  Engaged with patient  for by telephone forinitial visit in response to referral for case management and/or care coordination services.   Assessments/Interventions:  Review of past medical history, allergies, medications, health status, including review of consultants reports, laboratory and other test data, was performed as part of comprehensive evaluation and provision of chronic care management services.  SDOH: (Social Determinant of Health) assessments and interventions performed: SDOH Interventions   Flowsheet Row Most Recent Value  SDOH Interventions   SDOH Interventions for the Following Domains Alcohol Usage, Depression, Stress  Financial Strain Interventions Other (Comment)  [Referral made to [facility] BSW]  Stress Interventions Offered Community Wellness Resources  Alcohol Brief Interventions/Follow-up Patient Refused  Depression Interventions/Treatment  Patient refuses Treatment      Advanced Directives Status:  See Care Plan for related entries.  Care Plan                 Allergies  Allergen Reactions  . Septra [Sulfamethoxazole-Trimethoprim] Other (See Comments)    Renal Failure  . Lactose Nausea And Vomiting    Medications Reviewed Today    Reviewed by Steele Sizer, MD (Physician) on 11/09/20 at 1041  Med List Status: <None>  Medication Order Taking? Sig Documenting Provider Last Dose Status Informant  aspirin EC 81 MG tablet [id number] Yes Take 1 tablet (81 mg total) by mouth daily.  Steele Sizer, MD Taking Active   atenolol (TENORMIN) 25 MG tablet [id number] Yes Take 1 tablet (25 mg total) by mouth every evening. Steele Sizer, MD  Active   atorvastatin (LIPITOR) 20 MG tablet [id number] Yes Take 1 tablet (20 mg total) by mouth daily. For your heart Steele Sizer, MD Taking Active   divalproex (DEPAKOTE) 250 MG DR tablet [id number] Yes Take 1 tablet (250 mg total) by mouth in the morning and at bedtime. For mood Steele Sizer, MD Taking Active   gabapentin (NEURONTIN) 300 MG capsule [id number] Yes Take 1 capsule (300 mg total) by mouth 3 (three) times daily. Steele Sizer, MD Taking Active   losartan-hydrochlorothiazide [facility]) 50-12.5 MG tablet [id number] Yes Take 1 tablet by mouth daily. For blood pressure Steele Sizer, MD Taking Active   umeclidinium-vilanterol [facility] ELLIPTA) 62.5-25 MCG/INH AEPB [id number] Yes Inhale 1 puff into the lungs daily. Steele Sizer, MD Taking Active           Patient Active Problem List   Diagnosis Date Noted  . Centrilobular emphysema ([place]) 10/04/2020  . Atherosclerosis of aorta ([place]) 10/04/2020  . CAD in native artery 10/04/2020  . Neuropathy 08/29/2020  . Essential hypertension 01/04/2019  . Crack cocaine use 12/17/2018  . Sebaceous cyst 12/17/2018  . Nonintractable episodic headache 10/05/2018  . Frequency of urination and polyuria 10/05/2018  . Encounter for tobacco use cessation counseling 10/05/2018  . Severe episode of recurrent major depressive disorder, without psychotic features ([place]) 10/05/2018  . Irritability and anger 10/05/2018  . Homelessness 09/19/2018  . Chronic neck pain 09/19/2018  . Chronic bilateral back pain 09/19/2018  . Right arm pain 08/14/2018  Conditions to be addressed/monitored per PCP order:  Depression, Homelessness and Substance Abuse  Care Plan : General Social Work (Adult)  Updates made by Greg Cutter, LCSW since 01/03/2021 12:00 AM    Problem: Issues managing finances, health  and mental health care   Priority: High  Note:    Timeframe:  Long-Range Goal Priority:  Medium Start Date:   01/03/21                       Expected End Date: 03/05/21                    Follow Up Date -01/11/21   - begin a notebook of services in my neighborhood or community - call 211 when I need some help - follow-up on any referrals for help I am given - think ahead to make sure my need does not become an emergency - make a note about what I need to have by the phone or take with me, like an identification card or social security number have a back-up plan    Why is this important?    Knowing how and where to find help for yourself or family in your neighborhood and community is an important skill.   You will want to take some steps to learn how.     CARE PLAN ENTRY (see longitudinal plan of care for additional care plan information)  Current Barriers:  . Patient with Depression, Homelessness, Financial Strain and Substance Abuse and is in need of assistance with connection to community resources  . Knowledge deficits and need for support, education and care coordination related to community resources support  . Limited social support and Housing barriers  Clinical Goal(s)  . Over the next 120 days, patient will work with care management team member to address concerns related to community resource needs . Clinical Goal(s): Over the next 120 days, patient will work with SW to reduce or manage symptoms of stress and increase knowledge and/or ability of: coping skills, healthy habits, self-management skills, and stress reduction.until connected for ongoing counseling.   Interventions provided by LCSW:  . Assessed patient's care coordination needs related to housing and discussed ongoing care management follow up  . Patient has been having ongoing financial issues since he has been out of work since September of 2019.  [name] Patient has a history of using cocaine and admits to  ongoing usage but denies wanting [facility] LCSW to make referral for substance abuse treatment at this time.  . Patient reports receiving $225 in food stamps per month.  . Patient reports having difficulty managing his health care expenses, medications and bills. [facility] LCSW will make referral for [facility] BSW on 01/03/21.  . Patient reports that he is stable where he resides but wishes to find his own residence. He reports that he lives with his friend and his friend's elderly mother. Provided patient with information about the [name] as well as an [employer] . Advised patient to consider the above housing options and possibility to work as part of the ongoing program requirements  . Assessed patient's previous treatment, needs, coping skills, current treatment, support system and barriers to care  . Other interventions: Solution-Focused Strategies, Mindfulness or Relaxation Training, Active listening / Reflection utilized , Emotional Supportive Provided, Motivational Interviewing, Brief CBT , Participation in support group encouraged , and Suicidal Ideation/Homicidal Ideation assessed: ; . Referral to [facility] BSW . Patient interviewed and appropriate  assessments performed . Discussed plans with patient for ongoing care management follow up and provided patient with direct contact information for care management team . Assisted patient/caregiver with obtaining information about health plan benefits . Provided education and assistance to client regarding Advanced Directives. . Discussed several options for long term counseling based on need and insurance.  Bertram Savin care team collaboration (see longitudinal plan of care) .    Patient Self Care Activities & Deficits:  . Patient is unable to independently navigate community resource options without care coordination support  . Acknowledges deficits and is motivated to resolve concern  . Patient will consider an [place] or the  [name] as discussed today . Unable to perform IADLs independently . Motivation for treatment  Patient Goals/Self-Care Activities: Over the next 120 days . Call your insurance provider to discuss transportation options . Call your insurance provider for more information about your Enhanced Benefits  . Continue with compliance of taking medication        Follow up:  Patient agrees to Care Plan and Follow-up.  Plan: The Managed Medicaid care management team will reach out to the patient again over the next 14 days.  Date of next scheduled Social Work care management/care coordination outreach:  01/11/21  Eula Fried, BSW, MSW, LCSW Managed Medicaid LCSW [email or web address] Phone: [phone]

## 2021-01-08 ENCOUNTER — Other Ambulatory Visit: Payer: Self-pay

## 2021-01-08 NOTE — Patient Instructions (Signed)
Visit Information ° °[name]  - as a part of your Medicaid benefit, you are eligible for care management and care coordination services at no cost or copay. I was unable to reach you by phone today but would be happy to help you with your health related needs. Please feel free to call me @ [phone]  ° °A member of the Managed Medicaid care management team will reach out to you again over the next 7 days.  ° °Shahrzad Koble, BSW, MHA °[facility]   [place]  °High Risk Managed Medicaid Team  °[phone]  °

## 2021-01-08 NOTE — Patient Outreach (Signed)
Care Coordination  01/08/2021  WES LEZOTTE 08/16/1966 [id number]   Medicaid Managed Care   Unsuccessful Outreach Note  01/08/2021 Name: James Padilla MRN: [id number] DOB: [birth year]  Referred by: Steele Sizer, MD Reason for referral : High Risk Managed Medicaid (MM Social Work Unsuccessful [employer])   An unsuccessful telephone outreach was attempted today. The patient was referred to the case management team for assistance with care management and care coordination.   Follow Up Plan: The care management team will reach out to the patient again over the next 7 days.   [name], BSW, [place] Managed Medicaid Team  [phone]

## 2021-01-08 NOTE — Progress Notes (Signed)
Name: James Padilla   MRN: [id number]    DOB: 09/15/1965   Date:01/09/2021       Progress Note  Subjective  Chief Complaint  Follow Up  HPI  HTN: he is now on losartan hctz and we added Atenolol 25 mg on his last visit, but he has been out of medication for two days. He states no side effects. No chest pain or palpitation.   Centribular emphysema: he states he has been thinking about his symptoms and states cough and sob worse in the mornings and at night. He has been using Anoro but does not seem to help, we will change from Anoro to Trelegy   Chronic neck/cervical myelopathy  and back pain: he has a long history of neuropathy, he has tingling on both arms and some myoclonus. He states has difficulty working due to the pain, waiting for disability, lives in friend's house, sleeps on a sofa. He had neck surgery scheduled for Summer 2020 but cancelled once due to COVID-19 and once due to positive drug screen for crack cocaine. He is still doing drugs , he states dismissed from Dr. Cari Caraway office  Unchanged  Rash: he noticed an itchy spot on his right lower back about one week ago. He has not tried any medication  Atherosclerosis of aorta: on statin therapy, denies side effects  Cocaine drug use: he states he went to social security office and was told he owns properties, they did not believe him and he got frustrated and mad and went back on doing drugs.   MDD: he takes Depakote but mostly for anger. He does not want to add another medication at this time   Patient Active Problem List   Diagnosis Date Noted  . Centrilobular emphysema ([place]) 10/04/2020  . Atherosclerosis of aorta ([place]) 10/04/2020  . CAD in native artery 10/04/2020  . Neuropathy 08/29/2020  . Essential hypertension 01/04/2019  . Crack cocaine use 12/17/2018  . Sebaceous cyst 12/17/2018  . Nonintractable episodic headache 10/05/2018  . Frequency of urination and polyuria 10/05/2018  . Encounter for tobacco use  cessation counseling 10/05/2018  . Severe episode of recurrent major depressive disorder, without psychotic features ([place]) 10/05/2018  . Irritability and anger 10/05/2018  . Homelessness 09/19/2018  . Chronic neck pain 09/19/2018  . Chronic bilateral back pain 09/19/2018  . Right arm pain 08/14/2018    Past Surgical History:  Procedure Laterality Date  . COLONOSCOPY WITH PROPOFOL N/A 10/15/2018   Procedure: COLONOSCOPY WITH PROPOFOL;  Surgeon: Jonathon Bellows, MD;  Location: [facility] ENDOSCOPY;  Service: Gastroenterology;  Laterality: N/A;  . MULTIPLE TOOTH EXTRACTIONS      Family History  Problem Relation Age of Onset  . Breast cancer Mother   . Aneurysm Father     Social History   Tobacco Use  . Smoking status: Current Some Day Smoker    Packs/day: 0.25    Years: 40.00    Pack years: 10.00    Types: Cigarettes  . Smokeless tobacco: Never Used  . Tobacco comment: currently smoking .25 ppd  Substance Use Topics  . Alcohol use: Not Currently    Comment: rarely      Current Outpatient Medications:  .  aspirin EC 81 MG tablet, Take 1 tablet (81 mg total) by mouth daily., Disp: 30 tablet, Rfl: 0 .  atenolol (TENORMIN) 25 MG tablet, Take 1 tablet (25 mg total) by mouth every evening., Disp: 90 tablet, Rfl: 0 .  atorvastatin (LIPITOR) 20 MG tablet, Take 1  tablet (20 mg total) by mouth daily. For your heart, Disp: 90 tablet, Rfl: 1 .  divalproex (DEPAKOTE) 250 MG DR tablet, Take 1 tablet (250 mg total) by mouth in the morning and at bedtime. For mood, Disp: 180 tablet, Rfl: 0 .  gabapentin (NEURONTIN) 300 MG capsule, Take 1 capsule (300 mg total) by mouth 3 (three) times daily., Disp: 270 capsule, Rfl: 0 .  losartan-hydrochlorothiazide (HYZAAR) 50-12.5 MG tablet, Take 1 tablet by mouth daily. For blood pressure, Disp: 90 tablet, Rfl: 0 .  umeclidinium-vilanterol (ANORO ELLIPTA) 62.5-25 MCG/INH AEPB, Inhale 1 puff into the lungs daily., Disp: 60 each, Rfl: 2  Allergies  Allergen  Reactions  . Septra [Sulfamethoxazole-Trimethoprim] Other (See Comments)    Renal Failure  . Lactose Nausea And Vomiting    I personally reviewed active problem list, medication list, allergies, family history, social history, health maintenance with the patient/caregiver today.   ROS  Constitutional: Negative for fever or weight change.  Respiratory: positive or cough and shortness of breath.   Cardiovascular: Negative for chest pain or palpitations.  Gastrointestinal: Negative for abdominal pain, no bowel changes.  Musculoskeletal: Negative for gait problem or joint swelling.  Skin: Negative for rash.  Neurological: Negative for dizziness , positive for intermittent  headache.  No other specific complaints in a complete review of systems (except as listed in HPI above).  Objective  Vitals:   01/09/21 1024  BP: 120/82  Pulse: 75  Resp: 16  Temp: 97.7 F (36.5 C)  TempSrc: Oral  SpO2: 98%  Weight: 119 lb (54 kg)  Height: 5\' 5"  (1.651 m)    Body mass index is 19.8 kg/m.  Physical Exam  Constitutional: Patient appears well-developed and well-nourished.  No distress.  HEENT: head atraumatic, normocephalic, pupils equal and reactive to light, neck supple Cardiovascular: Normal rate, regular rhythm and normal heart sounds.  No murmur heard. No BLE edema. Pulmonary/Chest: Effort normal and breath sounds normal. No respiratory distress. Abdominal: Soft.  There is no tenderness. Psychiatric: Patient has a normal mood and affect. behavior is normal. Judgment and thought content normal. Muscular skeletal: slow gait, muscle atrophy on his back, seems to favor his left side   PHQ2/9: Depression screen [facility] 2/9 01/09/2021 12/01/2020 11/09/2020 10/04/2020 08/29/2020  Decreased Interest 2 0 0 2 1  Down, Depressed, Hopeless 1 2 3 2 1   PHQ - 2 Score 3 2 3 4 2   Altered sleeping 2 1 1 1 1   Tired, decreased energy 2 1 1 1 1   Change in appetite 2 0 0 0 0  Feeling bad or failure about  yourself  0 1 1 1 1   Trouble concentrating 0 1 0 1 0  Moving slowly or fidgety/restless 0 1 0 0 0  Suicidal thoughts 0 0 0 0 0  PHQ-9 Score 9 7 6 8 5   Difficult doing work/chores - - - - Somewhat difficult  Some recent data might be hidden    phq 9 is positive   Fall Risk: Fall Risk  01/09/2021 11/09/2020 10/10/2020 10/04/2020 08/31/2020  Falls in the past year? 0 1 1 1  0  Number falls in past yr: 0 0 - 1 -  Injury with Fall? 0 0 - 0 -  Risk for fall due to : - - - - -  Follow up - - - - -     Functional Status Survey: Is the patient deaf or have difficulty hearing?: Yes Does the patient have difficulty seeing, even when wearing  glasses/contacts?: Yes Does the patient have difficulty concentrating, remembering, or making decisions?: No Does the patient have difficulty walking or climbing stairs?: Yes Does the patient have difficulty dressing or bathing?: Yes Does the patient have difficulty doing errands alone such as visiting a doctor's office or shopping?: Yes    Assessment & Plan  1. Chronic neck pain  - gabapentin (NEURONTIN) 300 MG capsule; Take 1 capsule (300 mg total) by mouth 3 (three) times daily.  Dispense: 270 capsule; Refill: 1  2. Irritability and anger  - divalproex (DEPAKOTE) 250 MG DR tablet; Take 1 tablet (250 mg total) by mouth in the morning and at bedtime. For mood  Dispense: 180 tablet; Refill: 1  3. Centrilobular emphysema ([place])  - Fluticasone-Umeclidin-Vilant (TRELEGY ELLIPTA) 100-62.5-25 MCG/INH AEPB; Inhale 1 puff into the lungs daily.  Dispense: 3 each; Refill: 1  4. 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CC)  - divalproex (DEPAKOTE) 250 MG DR tablet; Take 1 tablet (250 mg total) by mouth in the morning and at bedtime. For mood  Dispense: 180 tablet; Refill: 1  5. Hypertension, benign  - atenolol (TENORMIN) 25 MG tablet; Take 1 tablet (25 mg total) by mouth every evening.  Dispense: 90 tablet; Refill: 0 -  losartan-hydrochlorothiazide (HYZAAR) 50-12.5 MG tablet; Take 1 tablet by mouth daily. For blood pressure  Dispense: 90 tablet; Refill: 0  6. Atherosclerosis of aorta (HCC)   7. Eczema, unspecified type  - triamcinolone cream (KENALOG) 0.1 %; Apply 1 application topically 2 (two) times daily.  Dispense: 30 g; Refill: 0  8. Cervical myelopathy ([place])   9. Drug abuse, cocaine type ([place])   10. Need for shingles vaccine  - Varicella-zoster vaccine IM

## 2021-01-09 ENCOUNTER — Encounter: Payer: Self-pay | Admitting: Family Medicine

## 2021-01-09 ENCOUNTER — Ambulatory Visit: Payer: Medicaid Other | Admitting: Family Medicine

## 2021-01-09 ENCOUNTER — Other Ambulatory Visit: Payer: Self-pay

## 2021-01-09 VITALS — BP 120/82 | HR 75 | Temp 97.7°F | Resp 16 | Ht 65.0 in | Wt 119.0 lb

## 2021-01-09 DIAGNOSIS — I700 Atherosclerosis of aorta: Secondary | ICD-10-CM

## 2021-01-09 DIAGNOSIS — F1410 Cocaine abuse, uncomplicated: Secondary | ICD-10-CM

## 2021-01-09 DIAGNOSIS — I10 Essential (primary) hypertension: Secondary | ICD-10-CM

## 2021-01-09 DIAGNOSIS — M542 Cervicalgia: Secondary | ICD-10-CM | POA: Diagnosis not present

## 2021-01-09 DIAGNOSIS — J432 Centrilobular emphysema: Secondary | ICD-10-CM

## 2021-01-09 DIAGNOSIS — Z23 Encounter for immunization: Secondary | ICD-10-CM | POA: Diagnosis not present

## 2021-01-09 DIAGNOSIS — F330 Major depressive disorder, recurrent, mild: Secondary | ICD-10-CM

## 2021-01-09 DIAGNOSIS — G959 Disease of spinal cord, unspecified: Secondary | ICD-10-CM

## 2021-01-09 DIAGNOSIS — G8929 Other chronic pain: Secondary | ICD-10-CM

## 2021-01-09 DIAGNOSIS — R454 Irritability and anger: Secondary | ICD-10-CM

## 2021-01-09 DIAGNOSIS — L309 Dermatitis, unspecified: Secondary | ICD-10-CM

## 2021-01-09 MED ORDER — DIVALPROEX SODIUM 250 MG PO DR TAB: 250.0000 mg | DELAYED_RELEASE_TABLET | Freq: Two times a day (BID) | ORAL | 1 refills | Status: DC

## 2021-01-09 MED ORDER — LOSARTAN POTASSIUM-HCTZ 50-12.5 MG PO TABS: 1.0000 | ORAL_TABLET | Freq: Every day | ORAL | 0 refills | Status: DC

## 2021-01-09 MED ORDER — ATENOLOL 25 MG PO TABS: 25.0000 mg | ORAL_TABLET | Freq: Every evening | ORAL | 0 refills | Status: DC

## 2021-01-09 MED ORDER — TRELEGY ELLIPTA 100-62.5-25 MCG/INH IN AEPB: 1.0000 | INHALATION_SPRAY | Freq: Every day | RESPIRATORY_TRACT | 1 refills | Status: DC

## 2021-01-09 MED ORDER — GABAPENTIN 300 MG PO CAPS: 300.0000 mg | ORAL_CAPSULE | Freq: Three times a day (TID) | ORAL | 1 refills | Status: DC

## 2021-01-09 MED ORDER — TRIAMCINOLONE ACETONIDE 0.1 % EX CREA: 1.0000 "application " | TOPICAL_CREAM | Freq: Two times a day (BID) | CUTANEOUS | 0 refills | Status: DC

## 2021-01-11 ENCOUNTER — Other Ambulatory Visit: Payer: Self-pay | Admitting: Licensed Clinical Social Worker

## 2021-01-11 NOTE — Patient Instructions (Signed)
Visit Information  Mr. Pietsch was given information about Medicaid Managed Care team care coordination services as a part of their Healthy [facility] Medicaid benefit. [name] verbally consented to engagement with the [facility] Managed Care team.   For questions related to your Healthy [facility] health plan, please call: [phone] or visit the homepage here: [email or web address]  If you would like to schedule transportation through your Healthy [facility] plan, please call the following number at least 2 days in advance of your appointment: [phone]   Call the [facility] at [phone], at any time, 24 hours a day, 7 days a week. If you are in danger or need immediate medical attention call 911.  [name] - following are the goals we discussed in your visit today:  Goals Addressed            This Visit's Progress   . Find Help in My Community       Timeframe:  Long-Range Goal Priority:  Medium Start Date:   01/03/21                       Expected End Date: 03/05/21                    Follow Up Date -01/25/21   - begin a notebook of services in my neighborhood or community - call 211 when I need some help - follow-up on any referrals for help I am given - think ahead to make sure my need does not become an emergency - make a note about what I need to have by the phone or take with me, like an identification card or social security number have a back-up plan    Why is this important?    Knowing how and where to find help for yourself or family in your neighborhood and community is an important skill.   You will want to take some steps to learn how.    Notes:        Eula Fried, BSW, MSW, [employer] Managed Medicaid LCSW [email or web address] Phone: [phone]   Following is a copy of your plan of care:  Patient Care Plan: General Social Work (Adult)    Problem  Identified: Issues managing finances, health and mental health care   Priority: High  Note:    Timeframe:  Long-Range Goal Priority:  Medium Start Date:   01/03/21                       Expected End Date: 03/05/21                    Follow Up Date -01/25/21   - begin a notebook of services in my neighborhood or community - call 211 when I need some help - follow-up on any referrals for help I am given - think ahead to make sure my need does not become an emergency - make a note about what I need to have by the phone or take with me, like an identification card or social security number have a back-up plan    Why is this important?    Knowing how and where to find help for yourself or family in your neighborhood and community is an important skill.   You will want to take some steps to learn how.     CARE PLAN ENTRY (see longitudinal plan of care  for additional care plan information)  Current Barriers:  . Patient with Depression, Homelessness, Financial Strain and Substance Abuse and is in need of assistance with connection to community resources  . Knowledge deficits and need for support, education and care coordination related to community resources support  . Limited social support and Housing barriers  Clinical Goal(s)  . Over the next 120 days, patient will work with care management team member to address concerns related to community resource needs . Clinical Goal(s): Over the next 120 days, patient will work with SW to reduce or manage symptoms of stress and increase knowledge and/or ability of: coping skills, healthy habits, self-management skills, and stress reduction.until connected for ongoing counseling.   Interventions provided by LCSW:  . Assessed patient's care coordination needs related to housing and discussed ongoing care management follow up  . Patient has been having ongoing financial issues since he has been out of work since September of 2019.  [name] Patient has a  history of using cocaine and admits to ongoing usage but denies wanting [facility] LCSW to make referral for substance abuse treatment at this time.  . Patient reports receiving $225 in food stamps per month.  . Patient reports having difficulty managing his health care expenses, medications and bills. [facility] LCSW will make referral for [facility] BSW on 01/03/21. Patient missed call from [facility] BSW and patient was advised to return her call today for community resource support and connection. Patient agreeable to do so.  . Patient reports that he is stable where he resides but wishes to find his own residence. He reports that he lives with his friend and his friend's elderly mother. Provided patient with information about the [name] as well as an [employer] . Advised patient to consider the above housing options and possibility to work as part of the ongoing program requirements  . Assessed patient's previous treatment, needs, coping skills, current treatment, support system and barriers to care  . Other interventions: Solution-Focused Strategies, Mindfulness or Relaxation Training, Active listening / Reflection utilized , Emotional Supportive Provided, Motivational Interviewing, Brief CBT , Participation in support group encouraged , and Suicidal Ideation/Homicidal Ideation assessed: ; . Past referral made to [facility] . Patient interviewed and appropriate assessments performed . Discussed plans with patient for ongoing care management follow up and provided patient with direct contact information for care management team . Assisted patient/caregiver with obtaining information about health plan benefits . Provided education and assistance to client regarding Advanced Directives. . Discussed several options for long term counseling based on need and insurance but patient denies wanting a referral to both substance abuse and mental health treatment again on 01/11/21. Patient also refuses the need for a  referral to [facility] RNCM or Pharmacist at this time.  Bertram Savin care team collaboration (see longitudinal plan of care) . [facility] LCSW will update [facility] BSW on 01/11/21   Patient Self Care Activities & Deficits:  . Patient is unable to independently navigate community resource options without care coordination support  . Acknowledges deficits and is motivated to resolve concern  . Patient will consider an [place] or the [name] as discussed today . Unable to perform IADLs independently . Motivation for treatment  Patient Goals/Self-Care Activities: Over the next 120 days . Call your insurance provider to discuss transportation options . Call your insurance provider for more information about your Enhanced Benefits  . Continue with compliance of taking medication

## 2021-01-11 NOTE — Patient Outreach (Signed)
Medicaid Managed Care Social Work Note  01/11/2021 Name:  James Padilla MRN:  [id number] DOB:  Mar 21, 1966  James Padilla is an 55 y.o. year old male who is a primary patient of James Sizer, MD.  The Medicaid Managed Care Coordination team was consulted for assistance with:  [place] and Resources  James Padilla was given information about Medicaid Managed CareCoordination services today. James Padilla agreed to services and verbal consent obtained.  Engaged with patient  for by telephone forfollow up visit in response to referral for case management and/or care coordination services.   Assessments/Interventions:  Review of past medical history, allergies, medications, health status, including review of consultants reports, laboratory and other test data, was performed as part of comprehensive evaluation and provision of chronic care management services.  SDOH: (Social Determinant of Health) assessments and interventions performed: SDOH Interventions   Flowsheet Row Most Recent Value  SDOH Interventions   SDOH Interventions for the Following Domains Alcohol Usage, Depression  Stress Interventions Offered Community Wellness Resources  Alcohol Brief Interventions/Follow-up Patient Refused  Depression Interventions/Treatment  Patient refuses Treatment      Advanced Directives Status:  Not addressed in this encounter.  Care Plan                 Allergies  Allergen Reactions  . Septra [Sulfamethoxazole-Trimethoprim] Other (See Comments)    Renal Failure  . Lactose Nausea And Vomiting    Medications Reviewed Today    Reviewed by James Padilla, CMA (Certified Medical Assistant) on 01/09/21 at 1017  Med List Status: <None>  Medication Order Taking? Sig Documenting Provider Last Dose Status Informant  aspirin EC 81 MG tablet [id number] Yes Take 1 tablet (81 mg total) by mouth daily. James Sizer, MD Taking Active   atenolol (TENORMIN) 25 MG tablet  [id number] Yes Take 1 tablet (25 mg total) by mouth every evening. James Sizer, MD Taking Active   atorvastatin (LIPITOR) 20 MG tablet [id number] Yes Take 1 tablet (20 mg total) by mouth daily. For your heart James Sizer, MD Taking Active   divalproex (DEPAKOTE) 250 MG DR tablet [id number] Yes Take 1 tablet (250 mg total) by mouth in the morning and at bedtime. For mood James Sizer, MD Taking Active   gabapentin (NEURONTIN) 300 MG capsule [id number] Yes Take 1 capsule (300 mg total) by mouth 3 (three) times daily. James Sizer, MD Taking Active   losartan-hydrochlorothiazide [facility]) 50-12.5 MG tablet [id number] Yes Take 1 tablet by mouth daily. For blood pressure James Sizer, MD Taking Active   umeclidinium-vilanterol [facility] ELLIPTA) 62.5-25 MCG/INH AEPB [id number] Yes Inhale 1 puff into the lungs daily. James Sizer, MD Taking Active           Patient Active Problem List   Diagnosis Date Noted  . Centrilobular emphysema ([place]) 10/04/2020  . Atherosclerosis of aorta ([place]) 10/04/2020  . CAD in native artery 10/04/2020  . Neuropathy 08/29/2020  . Essential hypertension 01/04/2019  . Crack cocaine use 12/17/2018  . Sebaceous cyst 12/17/2018  . Nonintractable episodic headache 10/05/2018  . Frequency of urination and polyuria 10/05/2018  . Encounter for tobacco use cessation counseling 10/05/2018  . Severe episode of recurrent major depressive disorder, without psychotic features ([place]) 10/05/2018  . Irritability and anger 10/05/2018  . Homelessness 09/19/2018  . Chronic neck pain 09/19/2018  . Chronic bilateral back pain 09/19/2018  . Right arm pain 08/14/2018    Conditions to be addressed/monitored per PCP order:  Anxiety and Depression  Care Plan : General Social Work (Adult)  Updates made by James Cutter, LCSW since 01/11/2021 12:00 AM    Problem: Issues managing finances, health and mental health care   Priority: High  Note:    Timeframe:  Long-Range  Goal Priority:  Medium Start Date:   01/03/21                       Expected End Date: 03/05/21                    Follow Up Date -01/25/21   - begin a notebook of services in my neighborhood or community - call 211 when I need some help - follow-up on any referrals for help I am given - think ahead to make sure my need does not become an emergency - make a note about what I need to have by the phone or take with me, like an identification card or social security number have a back-up plan    Why is this important?    Knowing how and where to find help for yourself or family in your neighborhood and community is an important skill.   You will want to take some steps to learn how.     CARE PLAN ENTRY (see longitudinal plan of care for additional care plan information)  Current Barriers:  . Patient with Depression, Homelessness, Financial Strain and Substance Abuse and is in need of assistance with connection to community resources  . Knowledge deficits and need for support, education and care coordination related to community resources support  . Limited social support and Housing barriers  Clinical Goal(s)  . Over the next 120 days, patient will work with care management team member to address concerns related to community resource needs . Clinical Goal(s): Over the next 120 days, patient will work with SW to reduce or manage symptoms of stress and increase knowledge and/or ability of: coping skills, healthy habits, self-management skills, and stress reduction.until connected for ongoing counseling.   Interventions provided by LCSW:  . Assessed patient's care coordination needs related to housing and discussed ongoing care management follow up  . Patient has been having ongoing financial issues since he has been out of work since September of 2019.  [name] Patient has a history of using cocaine and admits to ongoing usage but denies wanting [facility] LCSW to make referral for substance abuse  treatment at this time.  . Patient reports receiving $225 in food stamps per month.  . Patient reports having difficulty managing his health care expenses, medications and bills. [facility] LCSW will make referral for [facility] BSW on 01/03/21. Patient missed call from [facility] BSW and patient was advised to return her call today for community resource support and connection. Patient agreeable to do so.  . Patient reports that he is stable where he resides but wishes to find his own residence. He reports that he lives with his friend and his friend's elderly mother. Provided patient with information about the [name] as well as an [employer] . Advised patient to consider the above housing options and possibility to work as part of the ongoing program requirements  . Assessed patient's previous treatment, needs, coping skills, current treatment, support system and barriers to care  . Other interventions: Solution-Focused Strategies, Mindfulness or Relaxation Training, Active listening / Reflection utilized , Emotional Supportive Provided, Motivational Interviewing, Brief CBT , Participation in support group encouraged , and Suicidal Ideation/Homicidal Ideation  assessed: ; . Past referral made to [place] . Patient interviewed and appropriate assessments performed . Discussed plans with patient for ongoing care management follow up and provided patient with direct contact information for care management team . Assisted patient/caregiver with obtaining information about health plan benefits . Provided education and assistance to client regarding Advanced Directives. . Discussed several options for long term counseling based on need and insurance but patient denies wanting a referral to both substance abuse and mental health treatment again on 01/11/21. Patient also refuses the need for a referral to [facility] RNCM or Pharmacist at this time.  Bertram Savin care team collaboration (see longitudinal plan of  care) . [facility] LCSW will update [facility] BSW on 01/11/21   Patient Self Care Activities & Deficits:  . Patient is unable to independently navigate community resource options without care coordination support  . Acknowledges deficits and is motivated to resolve concern  . Patient will consider an [place] or the [name] as discussed today . Unable to perform IADLs independently . Motivation for treatment  Patient Goals/Self-Care Activities: Over the next 120 days . Call your insurance provider to discuss transportation options . Call your insurance provider for more information about your Enhanced Benefits  . Continue with compliance of taking medication       GAD 7 : Generalized Anxiety Score 01/11/2021 12/17/2018  Nervous, Anxious, on Edge 0 3  Control/stop worrying 0 3  Worry too much - different things 0 3  Trouble relaxing 1 3  Restless 1 0  Easily annoyed or irritable 3 3  Afraid - awful might happen 0 3  Total GAD 7 Score 5 18  Anxiety Difficulty Somewhat difficult Extremely difficult    Depression screen [facility] 2/9 01/11/2021 01/09/2021 12/01/2020 11/09/2020 10/04/2020  Decreased Interest 1 2 0 0 2  Down, Depressed, Hopeless 1 1 2 3 2   PHQ - 2 Score 2 3 2 3 4   Altered sleeping 2 2 1 1 1   Tired, decreased energy 2 2 1 1 1   Change in appetite 2 2 0 0 0  Feeling bad or failure about yourself  0 0 1 1 1   Trouble concentrating 0 0 1 0 1  Moving slowly or fidgety/restless 0 0 1 0 0  Suicidal thoughts 0 0 0 0 0  PHQ-9 Score 8 9 7 6 8   Difficult doing work/chores Somewhat difficult - - - -  Some recent data might be hidden    Follow up:  Patient agrees to Care Plan and Follow-up.  Plan: The Managed Medicaid care management team will reach out to the patient again over the next 30 days.  Date of next scheduled Social Work care management/care coordination outreach:  01/25/21  Eula Fried, BSW, MSW, LCSW Managed Medicaid LCSW [email or web address] Phone: [phone]

## 2021-01-17 ENCOUNTER — Other Ambulatory Visit: Payer: Self-pay

## 2021-01-17 NOTE — Patient Instructions (Signed)
Visit Information  James Padilla was given information about Medicaid Managed Care team care coordination services as a part of their Healthy [facility] Medicaid benefit. [name] verbally consented to engagement with the [facility] Managed Care team.   For questions related to your Healthy [facility] health plan, please call: [phone] or visit the homepage here: [email or web address]  If you would like to schedule transportation through your Healthy [facility] plan, please call the following number at least 2 days in advance of your appointment: [phone]   Call the [place] at [phone], at any time, 24 hours a day, 7 days a week. If you are in danger or need immediate medical attention call 911.  James Padilla - following are the goals we discussed in your visit today:  Goals Addressed   None     Social Worker will follow up in 14 days.   [name], BSW, [place]  High Risk Managed Medicaid Team  [phone]  Following is a copy of your plan of care:  Patient Care Plan: General Social Work (Adult)    Problem Identified: Issues managing finances, health and mental health care   Priority: High  Note:    Timeframe:  Long-Range Goal Priority:  Medium Start Date:   01/03/21                       Expected End Date: 03/05/21                    Follow Up Date -01/25/21   - begin a notebook of services in my neighborhood or community - call 211 when I need some help - follow-up on any referrals for help I am given - think ahead to make sure my need does not become an emergency - make a note about what I need to have by the phone or take with me, like an identification card or social security number have a back-up plan    Why is this important?    Knowing how and where to find help for yourself or family in your neighborhood and community is an important skill.   You will want to  take some steps to learn how.     CARE PLAN ENTRY (see longitudinal plan of care for additional care plan information)  Current Barriers:  . Patient with Depression, Homelessness, Financial Strain and Substance Abuse and is in need of assistance with connection to community resources  . Knowledge deficits and need for support, education and care coordination related to community resources support  . Limited social support and Housing barriers  Clinical Goal(s)  . Over the next 120 days, patient will work with care management team member to address concerns related to community resource needs . Clinical Goal(s): Over the next 120 days, patient will work with SW to reduce or manage symptoms of stress and increase knowledge and/or ability of: coping skills, healthy habits, self-management skills, and stress reduction.until connected for ongoing counseling.   Interventions provided by LCSW:  . Assessed patient's care coordination needs related to housing and discussed ongoing care management follow up  . Patient has been having ongoing financial issues since he has been out of work since September of 2019.  [name] Patient has a history of using cocaine and admits to ongoing usage but denies wanting [facility] LCSW to make referral for substance abuse treatment at this time.  . Patient reports receiving $  225 in food stamps per month.  . Patient reports having difficulty managing his health care expenses, medications and bills. [facility] LCSW will make referral for [facility] BSW on 01/03/21. Patient missed call from [facility] BSW and patient was advised to return her call today for community resource support and connection. Patient agreeable to do so.  . Patient reports that he is stable where he resides but wishes to find his own residence. He reports that he lives with his friend and his friend's elderly mother. Provided patient with information about the [name] as well as an [employer] . Advised patient to  consider the above housing options and possibility to work as part of the ongoing program requirements  . Assessed patient's previous treatment, needs, coping skills, current treatment, support system and barriers to care  . Other interventions: Solution-Focused Strategies, Mindfulness or Relaxation Training, Active listening / Reflection utilized , Emotional Supportive Provided, Motivational Interviewing, Brief CBT , Participation in support group encouraged , and Suicidal Ideation/Homicidal Ideation assessed: ; . Past referral made to [place] . Patient interviewed and appropriate assessments performed . Discussed plans with patient for ongoing care management follow up and provided patient with direct contact information for care management team . Assisted patient/caregiver with obtaining information about health plan benefits . Provided education and assistance to client regarding Advanced Directives. . Discussed several options for long term counseling based on need and insurance but patient denies wanting a referral to both substance abuse and mental health treatment again on 01/11/21. Patient also refuses the need for a referral to [facility] RNCM or Pharmacist at this time.  Bertram Savin care team collaboration (see longitudinal plan of care) . [facility] LCSW will update [facility] BSW on 01/11/21 . 01/17/21: BSW spoke with patient about his financial strains. Patient states he has already been approved for disability/Ssi but has not started receiving yet due to some additional stipulations. Patient states he is working with a company to assist him with getting his disability but could not remember the name. Patient states he is waiting on a letter to come from them.  . Patient does not have to be out of the home he is staying at right now but wants a peace of mind. Patient does not want to go into any type of shelter/substance abuse home. BSW will start working on a list of properties in [place] for  patient to contact once he starts receiving his disability.   Patient Self Care Activities & Deficits:  . Patient is unable to independently navigate community resource options without care coordination support  . Acknowledges deficits and is motivated to resolve concern  . Patient will consider an [place] or the [name] as discussed today . Unable to perform IADLs independently . Motivation for treatment  Patient Goals/Self-Care Activities: Over the next 120 days . Call your insurance provider to discuss transportation options . Call your insurance provider for more information about your Enhanced Benefits  . Continue with compliance of taking medication

## 2021-01-17 NOTE — Patient Outreach (Signed)
Medicaid Managed Care Social Work Note  01/17/2021 Name:  James Padilla MRN:  [id number] DOB:  07-19-1966  [name] is an 55 y.o. year old male who is a primary patient of Steele Sizer, MD.  The Medicaid Managed Care Coordination team was consulted for assistance with:  housing and financial strains  Mr. Chizmar was given information about Medicaid Managed CareCoordination services today. [name] agreed to services and verbal consent obtained.  Engaged with patient  for by telephone forinitial visit in response to referral for case management and/or care coordination services.   Assessments/Interventions:  Review of past medical history, allergies, medications, health status, including review of consultants reports, laboratory and other test data, was performed as part of comprehensive evaluation and provision of chronic care management services.  SDOH: (Social Determinant of Health) assessments and interventions performed:  . BSW spoke with patient about his financial strains. Patient states he has already been approved for disability/Ssi but has not started receiving yet due to some additional stipulations. Patient states he is working with a company to assist him with getting his disability but could not remember the name. Patient states he is waiting on a letter to come from them.  . Patient does not have to be out of the home he is staying at right now but wants a peace of mind. Patient does not want to go into any type of shelter/substance abuse home. BSW will start working on a list of properties in [place] for patient to contact once he starts receiving his disability.     Advanced Directives Status:  Not addressed in this encounter.  Care Plan                 Allergies  Allergen Reactions  . Septra [Sulfamethoxazole-Trimethoprim] Other (See Comments)    Renal Failure  . Lactose Nausea And Vomiting    Medications Reviewed Today    Reviewed by [name],  CMA (Certified Medical Assistant) on 01/09/21 at 1017  Med List Status: <None>  Medication Order Taking? Sig Documenting Provider Last Dose Status Informant  aspirin EC 81 MG tablet [id number] Yes Take 1 tablet (81 mg total) by mouth daily. Steele Sizer, MD Taking Active   atenolol (TENORMIN) 25 MG tablet [id number] Yes Take 1 tablet (25 mg total) by mouth every evening. Steele Sizer, MD Taking Active   atorvastatin (LIPITOR) 20 MG tablet [id number] Yes Take 1 tablet (20 mg total) by mouth daily. For your heart Steele Sizer, MD Taking Active   divalproex (DEPAKOTE) 250 MG DR tablet [id number] Yes Take 1 tablet (250 mg total) by mouth in the morning and at bedtime. For mood Steele Sizer, MD Taking Active   gabapentin (NEURONTIN) 300 MG capsule [id number] Yes Take 1 capsule (300 mg total) by mouth 3 (three) times daily. Steele Sizer, MD Taking Active   losartan-hydrochlorothiazide [facility]) 50-12.5 MG tablet [id number] Yes Take 1 tablet by mouth daily. For blood pressure Steele Sizer, MD Taking Active   umeclidinium-vilanterol [facility] ELLIPTA) 62.5-25 MCG/INH AEPB [id number] Yes Inhale 1 puff into the lungs daily. Steele Sizer, MD Taking Active           Patient Active Problem List   Diagnosis Date Noted  . Centrilobular emphysema ([place]) 10/04/2020  . Atherosclerosis of aorta ([place]) 10/04/2020  . CAD in native artery 10/04/2020  . Neuropathy 08/29/2020  . Essential hypertension 01/04/2019  . Crack cocaine use 12/17/2018  . Sebaceous cyst 12/17/2018  . Nonintractable episodic headache 10/05/2018  .  Frequency of urination and polyuria 10/05/2018  . Encounter for tobacco use cessation counseling 10/05/2018  . Severe episode of recurrent major depressive disorder, without psychotic features ([place]) 10/05/2018  . Irritability and anger 10/05/2018  . Homelessness 09/19/2018  . Chronic neck pain 09/19/2018  . Chronic bilateral back pain 09/19/2018  . Right arm pain 08/14/2018     Conditions to be addressed/monitored per PCP order:  housing and financial strain  Care Plan : General Social Work (Adult)  Updates made by [name] since 01/17/2021 12:00 AM    Problem: Issues managing finances, health and mental health care   Priority: High  Note:    Timeframe:  Long-Range Goal Priority:  Medium Start Date:   01/03/21                       Expected End Date: 03/05/21                    Follow Up Date -01/25/21   - begin a notebook of services in my neighborhood or community - call 211 when I need some help - follow-up on any referrals for help I am given - think ahead to make sure my need does not become an emergency - make a note about what I need to have by the phone or take with me, like an identification card or social security number have a back-up plan    Why is this important?    Knowing how and where to find help for yourself or family in your neighborhood and community is an important skill.   You will want to take some steps to learn how.     CARE PLAN ENTRY (see longitudinal plan of care for additional care plan information)  Current Barriers:  . Patient with Depression, Homelessness, Financial Strain and Substance Abuse and is in need of assistance with connection to community resources  . Knowledge deficits and need for support, education and care coordination related to community resources support  . Limited social support and Housing barriers  Clinical Goal(s)  . Over the next 120 days, patient will work with care management team member to address concerns related to community resource needs . Clinical Goal(s): Over the next 120 days, patient will work with SW to reduce or manage symptoms of stress and increase knowledge and/or ability of: coping skills, healthy habits, self-management skills, and stress reduction.until connected for ongoing counseling.   Interventions provided by LCSW:  . Assessed patient's care coordination needs  related to housing and discussed ongoing care management follow up  . Patient has been having ongoing financial issues since he has been out of work since September of 2019.  [name] Patient has a history of using cocaine and admits to ongoing usage but denies wanting [facility] LCSW to make referral for substance abuse treatment at this time.  . Patient reports receiving $225 in food stamps per month.  . Patient reports having difficulty managing his health care expenses, medications and bills. [facility] LCSW will make referral for [facility] BSW on 01/03/21. Patient missed call from [facility] BSW and patient was advised to return her call today for community resource support and connection. Patient agreeable to do so.  . Patient reports that he is stable where he resides but wishes to find his own residence. He reports that he lives with his friend and his friend's elderly mother. Provided patient with information about the [name] as well as an  [employer] . Advised patient to consider the above housing options and possibility to work as part of the ongoing program requirements  . Assessed patient's previous treatment, needs, coping skills, current treatment, support system and barriers to care  . Other interventions: Solution-Focused Strategies, Mindfulness or Relaxation Training, Active listening / Reflection utilized , Emotional Supportive Provided, Motivational Interviewing, Brief CBT , Participation in support group encouraged , and Suicidal Ideation/Homicidal Ideation assessed: ; . Past referral made to [place] . Patient interviewed and appropriate assessments performed . Discussed plans with patient for ongoing care management follow up and provided patient with direct contact information for care management team . Assisted patient/caregiver with obtaining information about health plan benefits . Provided education and assistance to client regarding Advanced Directives. . Discussed several options for long  term counseling based on need and insurance but patient denies wanting a referral to both substance abuse and mental health treatment again on 01/11/21. Patient also refuses the need for a referral to [facility] RNCM or Pharmacist at this time.  Bertram Savin care team collaboration (see longitudinal plan of care) . [facility] LCSW will update [facility] BSW on 01/11/21 . 01/17/21: BSW spoke with patient about his financial strains. Patient states he has already been approved for disability/Ssi but has not started receiving yet due to some additional stipulations. Patient states he is working with a company to assist him with getting his disability but could not remember the name. Patient states he is waiting on a letter to come from them.  . Patient does not have to be out of the home he is staying at right now but wants a peace of mind. Patient does not want to go into any type of shelter/substance abuse home. BSW will start working on a list of properties in [place] for patient to contact once he starts receiving his disability.   Patient Self Care Activities & Deficits:  . Patient is unable to independently navigate community resource options without care coordination support  . Acknowledges deficits and is motivated to resolve concern  . Patient will consider an [place] or the [name] as discussed today . Unable to perform IADLs independently . Motivation for treatment  Patient Goals/Self-Care Activities: Over the next 120 days . Call your insurance provider to discuss transportation options . Call your insurance provider for more information about your Enhanced Benefits  . Continue with compliance of taking medication        Follow up:  Patient agrees to Care Plan and Follow-up.  Plan: The Managed Medicaid care management team will reach out to the patient again over the next 14 days.  Date/time of next scheduled Social Work care management/care coordination outreach:   02/06/21  [name], Arita Miss, [place] Medicaid Team  [phone]

## 2021-01-25 ENCOUNTER — Other Ambulatory Visit: Payer: Self-pay | Admitting: Licensed Clinical Social Worker

## 2021-01-25 NOTE — Patient Outreach (Signed)
Medicaid Managed Care Social Work Note  01/25/2021 Name:  James Padilla MRN:  [id number] DOB:  Apr 15, 1966  James Padilla is an 55 y.o. year old male who is a primary patient of Steele Sizer, MD.  The Medicaid Managed Care Coordination team was consulted for assistance with:  [place] and Resources  Mr. Woolbright was given information about Medicaid Managed CareCoordination services today. [name] agreed to services and verbal consent obtained.  Engaged with patient  for by telephone forfollow up visit in response to referral for case management and/or care coordination services.   Assessments/Interventions:  Review of past medical history, allergies, medications, health status, including review of consultants reports, laboratory and other test data, was performed as part of comprehensive evaluation and provision of chronic care management services.  SDOH: (Social Determinant of Health) assessments and interventions performed:   Advanced Directives Status:  See Care Plan for related entries.  Care Plan                 Allergies  Allergen Reactions  . Septra [Sulfamethoxazole-Trimethoprim] Other (See Comments)    Renal Failure  . Lactose Nausea And Vomiting    Medications Reviewed Today    Reviewed by [name], CMA (Certified Medical Assistant) on 01/09/21 at 1017  Med List Status: <None>  Medication Order Taking? Sig Documenting Provider Last Dose Status Informant  aspirin EC 81 MG tablet [id number] Yes Take 1 tablet (81 mg total) by mouth daily. Steele Sizer, MD Taking Active   atenolol (TENORMIN) 25 MG tablet [id number] Yes Take 1 tablet (25 mg total) by mouth every evening. Steele Sizer, MD Taking Active   atorvastatin (LIPITOR) 20 MG tablet [id number] Yes Take 1 tablet (20 mg total) by mouth daily. For your heart Steele Sizer, MD Taking Active   divalproex (DEPAKOTE) 250 MG DR tablet [id number] Yes Take 1 tablet (250 mg total) by mouth in the morning  and at bedtime. For mood Steele Sizer, MD Taking Active   gabapentin (NEURONTIN) 300 MG capsule [id number] Yes Take 1 capsule (300 mg total) by mouth 3 (three) times daily. Steele Sizer, MD Taking Active   losartan-hydrochlorothiazide [facility]) 50-12.5 MG tablet [id number] Yes Take 1 tablet by mouth daily. For blood pressure Steele Sizer, MD Taking Active   umeclidinium-vilanterol [facility] ELLIPTA) 62.5-25 MCG/INH AEPB [id number] Yes Inhale 1 puff into the lungs daily. Steele Sizer, MD Taking Active           Patient Active Problem List   Diagnosis Date Noted  . Centrilobular emphysema ([place]) 10/04/2020  . Atherosclerosis of aorta ([place]) 10/04/2020  . CAD in native artery 10/04/2020  . Neuropathy 08/29/2020  . Essential hypertension 01/04/2019  . Crack cocaine use 12/17/2018  . Sebaceous cyst 12/17/2018  . Nonintractable episodic headache 10/05/2018  . Frequency of urination and polyuria 10/05/2018  . Encounter for tobacco use cessation counseling 10/05/2018  . Severe episode of recurrent major depressive disorder, without psychotic features ([place]) 10/05/2018  . Irritability and anger 10/05/2018  . Homelessness 09/19/2018  . Chronic neck pain 09/19/2018  . Chronic bilateral back pain 09/19/2018  . Right arm pain 08/14/2018    Conditions to be addressed/monitored per PCP order:  Anxiety and Depression  Care Plan : General Social Work (Adult)  Updates made by Greg Cutter, LCSW since 01/25/2021 12:00 AM    Problem: Issues managing finances, health and mental health care   Priority: High  Note:    Timeframe:  Long-Range Goal Priority:  Medium  Start Date:   01/03/21                       Expected End Date: 03/05/21                    Follow Up Date -02/12/21   - begin a notebook of services in my neighborhood or community - call 211 when I need some help - follow-up on any referrals for help I am given - think ahead to make sure my need does not become an emergency -  make a note about what I need to have by the phone or take with me, like an identification card or social security number have a back-up plan    Why is this important?    Knowing how and where to find help for yourself or family in your neighborhood and community is an important skill.   You will want to take some steps to learn how.     CARE PLAN ENTRY (see longitudinal plan of care for additional care plan information)  Current Barriers:  . Patient with Depression, Homelessness, Financial Strain and Substance Abuse and is in need of assistance with connection to community resources  . Knowledge deficits and need for support, education and care coordination related to community resources support  . Limited social support and Housing barriers  Clinical Goal(s)  . Over the next 120 days, patient will work with care management team member to address concerns related to community resource needs . Clinical Goal(s): Over the next 120 days, patient will work with SW to reduce or manage symptoms of stress and increase knowledge and/or ability of: coping skills, healthy habits, self-management skills, and stress reduction.until connected for ongoing counseling.   Interventions provided by LCSW:  . Assessed patient's care coordination needs related to housing and discussed ongoing care management follow up  . Patient has been having ongoing financial issues since he has been out of work since September of 2019.  [name] Patient has a history of using cocaine and admits to ongoing usage but denies wanting [facility] LCSW to make referral for substance abuse treatment at this time. Patient reports that his last usage of cocaine was 01/20/21. He is still adamant about not receiving mental health or substance abuse treatment at this time but would like to work on his coping skills.  . Patient reports receiving $225 in food stamps per month.  . Patient reports having difficulty managing his health care expenses,  medications and bills. [facility] LCSW will make referral for [facility] BSW on 01/03/21. Patient missed call from [facility] BSW and patient was advised to return her call today for community resource support and connection. Patient agreeable to do so.  . Patient reports that he is stable where he resides but wishes to find his own residence. He reports that he lives with his friend and his friend's elderly mother. Provided patient with information about the [name] as well as an [employer] . Advised patient to consider the above housing options and possibility to work as part of the ongoing program requirements  . Assessed patient's previous treatment, needs, coping skills, current treatment, support system and barriers to care  . Other interventions: Solution-Focused Strategies, Mindfulness or Relaxation Training, Active listening / Reflection utilized , Emotional Supportive Provided, Motivational Interviewing, Brief CBT , Participation in support group encouraged , and Suicidal Ideation/Homicidal Ideation assessed: ; . Past referral made to [place] . Patient interviewed  and appropriate assessments performed . Discussed plans with patient for ongoing care management follow up and provided patient with direct contact information for care management team . Assisted patient/caregiver with obtaining information about health plan benefits . Provided education and assistance to client regarding Advanced Directives. [name] LCSW discussed coping skills for anxiety. SW used empathetic and active and reflective listening, validated patient's feelings/concerns, and provided emotional support. LCSW provided self-care examples to help patient manage their multiple health conditions and improve his mood. Patient admits that his anger gets the best of him sometimes.  . Discussed several options for long term counseling based on need and insurance but patient denies wanting a referral to both substance abuse and mental health  treatment again on 01/25/21. Patient also refuses the need for a referral to [facility] RNCM or Pharmacist at this time.  Bertram Savin care team collaboration (see longitudinal plan of care) . [facility] LCSW will update [facility] BSW on 01/11/21 . 01/17/21: BSW spoke with patient about his financial strains. Patient states he has already been approved for disability/Ssi but has not started receiving yet due to some additional stipulations. Patient states he is working with a company to assist him with getting his disability but could not remember the name. Patient states he is waiting on a letter to come from them.  . Patient does not have to be out of the home he is staying at right now but wants a peace of mind. Patient does not want to go into any type of shelter/substance abuse home. BSW will start working on a list of properties in [place] for patient to contact once he starts receiving his disability.   Patient Self Care Activities & Deficits:  . Patient is unable to independently navigate community resource options without care coordination support  . Acknowledges deficits and is motivated to resolve concern  . Patient will consider an [place] or the [name] as discussed today . Unable to perform IADLs independently . Motivation for treatment  Patient Goals/Self-Care Activities: Over the next 120 days . Call your insurance provider to discuss transportation options . Call your insurance provider for more information about your Enhanced Benefits  . Continue with compliance of taking medication        Follow up:  Patient agrees to Care Plan and Follow-up.  Plan: The Managed Medicaid care management team will reach out to the patient again over the next 30 days.  Date of next scheduled Social Work care management/care coordination outreach:  02/12/21  Eula Fried, BSW, MSW, LCSW Managed Medicaid LCSW [email or web address] Phone: [phone]

## 2021-01-25 NOTE — Patient Instructions (Signed)
Visit Information  Mr. Campi was given information about Medicaid Managed Care team care coordination services as a part of their Healthy [facility] Medicaid benefit. [name] verbally consented to engagement with the [facility] Managed Care team.   For questions related to your Healthy [facility] health plan, please call: [phone] or visit the homepage here: [email or web address]  If you would like to schedule transportation through your Healthy [facility] plan, please call the following number at least 2 days in advance of your appointment: [phone]   Call the [place] at [phone], at any time, 24 hours a day, 7 days a week. If you are in danger or need immediate medical attention call 911.  Mr. Leard - following are the goals we discussed in your visit today:  Goals Addressed            This Visit's Progress   . Find Help in My Community       Timeframe:  Long-Range Goal Priority:  Medium Start Date:   01/03/21                       Expected End Date: 03/05/21                    Follow Up Date -02/12/21   - begin a notebook of services in my neighborhood or community - call 211 when I need some help - follow-up on any referrals for help I am given - think ahead to make sure my need does not become an emergency - make a note about what I need to have by the phone or take with me, like an identification card or social security number have a back-up plan    Why is this important?    Knowing how and where to find help for yourself or family in your neighborhood and community is an important skill.   You will want to take some steps to learn how.    Notes:       Eula Fried, BSW, MSW, [employer] [facility] Managed Medicaid LCSW [email or web address] Phone: [phone]

## 2021-02-06 ENCOUNTER — Other Ambulatory Visit: Payer: Self-pay

## 2021-02-06 NOTE — Patient Outreach (Signed)
Medicaid Managed Care Social Work Note  02/06/2021 Name:  ANDERS HOHMANN MRN:  [id number] DOB:  1965-12-02  James Padilla is an 55 y.o. year old male who is a primary patient of Steele Sizer, MD.  The Medicaid Managed Care Coordination team was consulted for assistance with:   housing options and disability payments  James Padilla was given information about Medicaid Managed CareCoordination services today. James Padilla agreed to services and verbal consent obtained.  Engaged with patient  for by telephone forfollow up visit in response to referral for case management and/or care coordination services.   Assessments/Interventions:  Review of past medical history, allergies, medications, health status, including review of consultants reports, laboratory and other test data, was performed as part of comprehensive evaluation and provision of chronic care management services.  SDOH: (Social Determinant of Health) assessments and interventions performed:  BSW followed up with patient about receiving his payments. Patient stated he did receive some paperwork from [place] yesterday and is going to a friends house tonight to get assistance with completing the paperwork.   Advanced Directives Status:  Not addressed in this encounter.  Care Plan                 Allergies  Allergen Reactions   Septra [Sulfamethoxazole-Trimethoprim] Other (See Comments)    Renal Failure   Lactose Nausea And Vomiting    Medications Reviewed Today     Reviewed by [name], [place] (Certified Medical Assistant) on 01/09/21 at 1017  Med List Status: <None>   Medication Order Taking? Sig Documenting Provider Last Dose Status Informant  aspirin EC 81 MG tablet [id number] Yes Take 1 tablet (81 mg total) by mouth daily. Steele Sizer, MD Taking Active   atenolol (TENORMIN) 25 MG tablet [id number] Yes Take 1 tablet (25 mg total) by mouth every evening. Steele Sizer, MD Taking Active   atorvastatin (LIPITOR) 20 MG tablet  [id number] Yes Take 1 tablet (20 mg total) by mouth daily. For your heart Steele Sizer, MD Taking Active   divalproex (DEPAKOTE) 250 MG DR tablet [id number] Yes Take 1 tablet (250 mg total) by mouth in the morning and at bedtime. For mood Steele Sizer, MD Taking Active   gabapentin (NEURONTIN) 300 MG capsule [id number] Yes Take 1 capsule (300 mg total) by mouth 3 (three) times daily. Steele Sizer, MD Taking Active   losartan-hydrochlorothiazide [facility]) 50-12.5 MG tablet [id number] Yes Take 1 tablet by mouth daily. For blood pressure Steele Sizer, MD Taking Active   umeclidinium-vilanterol [facility] ELLIPTA) 62.5-25 MCG/INH AEPB [id number] Yes Inhale 1 puff into the lungs daily. Steele Sizer, MD Taking Active             Patient Active Problem List   Diagnosis Date Noted   Centrilobular emphysema ([place]) 10/04/2020   Atherosclerosis of aorta ([place]) 10/04/2020   CAD in native artery 10/04/2020   Neuropathy 08/29/2020   Essential hypertension 01/04/2019   Crack cocaine use 12/17/2018   Sebaceous cyst 12/17/2018   Nonintractable episodic headache 10/05/2018   Frequency of urination and polyuria 10/05/2018   Encounter for tobacco use cessation counseling 10/05/2018   Severe episode of recurrent major depressive disorder, without psychotic features ([place]) 10/05/2018   Irritability and anger 10/05/2018   Homelessness 09/19/2018   Chronic neck pain 09/19/2018   Chronic bilateral back pain 09/19/2018   Right arm pain 08/14/2018    Conditions to be addressed/monitored per PCP order:   disability payments and housing options  Care Plan : General Social  Work (Adult)  Updates made by [name] since 02/06/2021 12:00 AM     Problem: Issues managing finances, health and mental health care   Priority: High  Note:    Timeframe:  Long-Range Goal Priority:  Medium Start Date:   01/03/21                       Expected End Date: 03/05/21                    Follow Up Date -02/12/21    - begin a notebook of services in my neighborhood or community - call 211 when I need some help - follow-up on any referrals for help I am given - think ahead to make sure my need does not become an emergency - make a note about what I need to have by the phone or take with me, like an identification card or social security number have a back-up plan    Why is this important?   Knowing how and where to find help for yourself or family in your neighborhood and community is an important skill.  You will want to take some steps to learn how.     CARE PLAN ENTRY (see longitudinal plan of care for additional care plan information)  Current Barriers:  Patient with Depression, Homelessness, Financial Strain and Substance Abuse and is in need of assistance with connection to community resources  Knowledge deficits and need for support, education and care coordination related to community resources support  Limited social support and Housing barriers  Clinical Goal(s)  Over the next 120 days, patient will work with care management team member to address concerns related to community resource needs Clinical Goal(s): Over the next 120 days, patient will work with SW to reduce or manage symptoms of stress and increase knowledge and/or ability of: coping skills, healthy habits, self-management skills, and stress reduction.until connected for ongoing counseling.   Interventions provided by LCSW:  Assessed patient's care coordination needs related to housing and discussed ongoing care management follow up  Patient has been having ongoing financial issues since he has been out of work since September of 2019.  Patient has a history of using cocaine and admits to ongoing usage but denies wanting [facility] LCSW to make referral for substance abuse treatment at this time. Patient reports that his last usage of cocaine was 01/20/21. He is still adamant about not receiving mental health or substance abuse treatment  at this time but would like to work on his coping skills.  Patient reports receiving $225 in food stamps per month.  Patient reports having difficulty managing his health care expenses, medications and bills. [facility] LCSW will make referral for [facility] BSW on 01/03/21. Patient missed call from [facility] BSW and patient was advised to return her call today for community resource support and connection. Patient agreeable to do so.  Patient reports that he is stable where he resides but wishes to find his own residence. He reports that he lives with his friend and his friend's elderly mother. Provided patient with information about the [name] as well as an [facility] patient to consider the above housing options and possibility to work as part of the ongoing program requirements  Assessed patient's previous treatment, needs, coping skills, current treatment, support system and barriers to care  Other interventions: Solution-Focused Strategies, Mindfulness or Psychologist, educational, Active listening / Reflection utilized , Emotional Supportive Provided, Motivational  Interviewing, Brief CBT , Participation in support group encouraged , and Suicidal Ideation/Homicidal Ideation assessed: ; Past referral made to [facility] BSW Patient interviewed and appropriate assessments performed Discussed plans with patient for ongoing care management follow up and provided patient with direct contact information for care management team Assisted patient/caregiver with obtaining information about health plan benefits Provided education and assistance to client regarding Advanced Directives. LCSW discussed coping skills for anxiety. SW used empathetic and active and reflective listening, validated patient's feelings/concerns, and provided emotional support. LCSW provided self-care examples to help patient manage their multiple health conditions and improve his mood. Patient admits that his anger gets the best of him  sometimes.  Discussed several options for long term counseling based on need and insurance but patient denies wanting a referral to both substance abuse and mental health treatment again on 01/25/21. Patient also refuses the need for a referral to [facility] RNCM or Pharmacist at this time.  Inter-disciplinary care team collaboration (see longitudinal plan of care) [facility] LCSW will update [place] on 01/11/21 01/17/21: BSW spoke with patient about his financial strains. Patient states he has already been approved for disability/Ssi but has not started receiving yet due to some additional stipulations. Patient states he is working with a company to assist him with getting his disability but could not remember the name. Patient states he is waiting on a letter to come from them.  Patient does not have to be out of the home he is staying at right now but wants a peace of mind. Patient does not want to go into any type of shelter/substance abuse home. BSW will start working on a list of properties in [place] for patient to contact once he starts receiving his disability. 02/06/21: BSW followed up with patient about receiving his payments. Patient stated he did receive some paperwork from [place] yesterday and is going to a friends house tonight to get assistance with completing the paperwork.   Patient Self Care Activities & Deficits:  Patient is unable to independently navigate community resource options without care coordination support  Acknowledges deficits and is motivated to resolve concern  Patient will consider an [employer] or the [name] as discussed today Unable to perform IADLs independently Motivation for treatment  Patient Goals/Self-Care Activities: Over the next 120 days Call your insurance provider to discuss transportation options Call your insurance provider for more information about your Enhanced Benefits  Continue with compliance of taking medication        Follow up:   Patient agrees to Care Plan and Follow-up.  Plan: The Managed Medicaid care management team will reach out to the patient again over the next 14 days.  Date/time of next scheduled Social Work care management/care coordination outreach:  02/27/21  [name], Arita Miss, [place] Managed Medicaid Team  [phone]

## 2021-02-06 NOTE — Patient Instructions (Signed)
Visit Information  James Padilla was given information about Medicaid Managed Care team care coordination services as a part of their Healthy [facility] Medicaid benefit. James Padilla verbally consented to engagement with the [facility] Managed Care team.   For questions related to your Healthy [facility] health plan, please call: [phone] or visit the homepage here: [email or web address]  If you would like to schedule transportation through your [facility] plan, please call the following number at least 2 days in advance of your appointment: [phone]   Call the [place] at [phone], at any time, 24 hours a day, 7 days a week. If you are in danger or need immediate medical attention call 911.  James Padilla - following are the goals we discussed in your visit today:   Goals Addressed   None       Social Worker will follow up with patient in 14 days.   James Padilla, BSW, [place]  High Risk Managed Medicaid Team  [phone]   Following is a copy of your plan of care:  Patient Care Plan: General Social Work (Adult)     Problem Identified: Issues managing finances, health and mental health care   Priority: High  Note:    Timeframe:  Long-Range Goal Priority:  Medium Start Date:   01/03/21                       Expected End Date: 03/05/21                    Follow Up Date -02/12/21   - begin a notebook of services in my neighborhood or community - call 211 when I need some help - follow-up on any referrals for help I am given - think ahead to make sure my need does not become an emergency - make a note about what I need to have by the phone or take with me, like an identification card or social security number have a back-up plan    Why is this important?   Knowing how and where to find help for yourself or family in your neighborhood and community is an important skill.   You will want to take some steps to learn how.     CARE PLAN ENTRY (see longitudinal plan of care for additional care plan information)  Current Barriers:  Patient with Depression, Homelessness, Financial Strain and Substance Abuse and is in need of assistance with connection to community resources  Knowledge deficits and need for support, education and care coordination related to community resources support  Limited social support and Housing barriers  Clinical Goal(s)  Over the next 120 days, patient will work with care management team member to address concerns related to community resource needs Clinical Goal(s): Over the next 120 days, patient will work with SW to reduce or manage symptoms of stress and increase knowledge and/or ability of: coping skills, healthy habits, self-management skills, and stress reduction.until connected for ongoing counseling.   Interventions provided by LCSW:  Assessed patient's care coordination needs related to housing and discussed ongoing care management follow up  Patient has been having ongoing financial issues since he has been out of work since September of 2019.  Patient has a history of using cocaine and admits to ongoing usage but denies wanting [facility] LCSW to make referral for substance abuse treatment at this time. Patient reports that his last usage of cocaine  was 01/20/21. He is still adamant about not receiving mental health or substance abuse treatment at this time but would like to work on his coping skills.  Patient reports receiving $225 in food stamps per month.  Patient reports having difficulty managing his health care expenses, medications and bills. [facility] LCSW will make referral for [facility] BSW on 01/03/21. Patient missed call from [facility] BSW and patient was advised to return her call today for community resource support and connection. Patient agreeable to do so.  Patient reports that he is stable where he resides but wishes to find his own  residence. He reports that he lives with his friend and his friend's elderly mother. Provided patient with information about the [name] as well as an [place] patient to consider the above housing options and possibility to work as part of the ongoing program requirements  Assessed patient's previous treatment, needs, coping skills, current treatment, support system and barriers to care  Other interventions: Solution-Focused Strategies, Mindfulness or Psychologist, educational, Active listening / Reflection utilized , Emotional Supportive Provided, Motivational Interviewing, Brief CBT , Participation in support group encouraged , and Suicidal Ideation/Homicidal Ideation assessed: ; Past referral made to [facility] BSW Patient interviewed and appropriate assessments performed Discussed plans with patient for ongoing care management follow up and provided patient with direct contact information for care management team Assisted patient/caregiver with obtaining information about health plan benefits Provided education and assistance to client regarding Advanced Directives. LCSW discussed coping skills for anxiety. SW used empathetic and active and reflective listening, validated patient's feelings/concerns, and provided emotional support. LCSW provided self-care examples to help patient manage their multiple health conditions and improve his mood. Patient admits that his anger gets the best of him sometimes.  Discussed several options for long term counseling based on need and insurance but patient denies wanting a referral to both substance abuse and mental health treatment again on 01/25/21. Patient also refuses the need for a referral to [facility] RNCM or Pharmacist at this time.  Inter-disciplinary care team collaboration (see longitudinal plan of care) [facility] LCSW will update [place] on 01/11/21 01/17/21: BSW spoke with patient about his financial strains. Patient states he has already been  approved for disability/Ssi but has not started receiving yet due to some additional stipulations. Patient states he is working with a company to assist him with getting his disability but could not remember the name. Patient states he is waiting on a letter to come from them.  Patient does not have to be out of the home he is staying at right now but wants a peace of mind. Patient does not want to go into any type of shelter/substance abuse home. BSW will start working on a list of properties in [place] for patient to contact once he starts receiving his disability. 02/06/21: BSW followed up with patient about receiving his payments. Patient stated he did receive some paperwork from [place] yesterday and is going to a friends house tonight to get assistance with completing the paperwork.   Patient Self Care Activities & Deficits:  Patient is unable to independently navigate community resource options without care coordination support  Acknowledges deficits and is motivated to resolve concern  Patient will consider an [place] or the [name] as discussed today Unable to perform IADLs independently Motivation for treatment  Patient Goals/Self-Care Activities: Over the next 120 days Call your insurance provider to discuss transportation options Call your insurance provider for more information about  your Enhanced Benefits  Continue with compliance of taking medication

## 2021-02-12 ENCOUNTER — Ambulatory Visit: Payer: Self-pay

## 2021-02-12 ENCOUNTER — Telehealth: Payer: Self-pay | Admitting: Licensed Clinical Social Worker

## 2021-02-12 NOTE — Patient Outreach (Signed)
  [facility]) Care Management  [facility] Social Work  02/12/2021  JOSEJUAN HOAGLIN Jan 30, 1966 [id number]  Encounter Medications:  Outpatient Encounter Medications as of 02/12/2021  Medication Sig   aspirin EC 81 MG tablet Take 1 tablet (81 mg total) by mouth daily.   atenolol (TENORMIN) 25 MG tablet Take 1 tablet (25 mg total) by mouth every evening.   atorvastatin (LIPITOR) 20 MG tablet Take 1 tablet (20 mg total) by mouth daily. For your heart   divalproex (DEPAKOTE) 250 MG DR tablet Take 1 tablet (250 mg total) by mouth in the morning and at bedtime. For mood   Fluticasone-Umeclidin-Vilant (TRELEGY ELLIPTA) 100-62.5-25 MCG/INH AEPB Inhale 1 puff into the lungs daily.   gabapentin (NEURONTIN) 300 MG capsule Take 1 capsule (300 mg total) by mouth 3 (three) times daily.   losartan-hydrochlorothiazide (HYZAAR) 50-12.5 MG tablet Take 1 tablet by mouth daily. For blood pressure   triamcinolone cream (KENALOG) 0.1 % Apply 1 application topically 2 (two) times daily.   No facility-administered encounter medications on file as of 02/12/2021.    Functional Status:  In your present state of health, do you have any difficulty performing the following activities: 01/09/2021 11/09/2020  Hearing? Y N  Vision? Y Y  Difficulty concentrating or making decisions? N N  Walking or climbing stairs? Y Y  Dressing or bathing? Y Y  Doing errands, shopping? Y N  Some recent data might be hidden    Fall/Depression Screening:  PHQ 2/9 Scores 01/11/2021 01/09/2021 12/01/2020 11/09/2020 10/04/2020 08/29/2020 06/27/2020  PHQ - 2 Score 2 3 2 3 4 2 4   PHQ- 9 Score 8 9 7 6 8 5  -    LCSW completed [facility] outreach attempt today but was unable to reach patient successfully. A HIPPA compliant voice message was left encouraging patient to return call once available. LCSW will reschedule patient's [facility] Social Work appointment if no return call has been made.  Eula Fried, BSW, MSW, [employer] Managed Medicaid LCSW [email or web address] Phone: [phone]   Plan:  Follow-up:  Follow-up in 3-4 week(s)

## 2021-02-12 NOTE — Patient Instructions (Signed)
[name] ,   The Medicaid Managed Care Team is available to provide assistance to you with your healthcare needs at no cost and as a benefit of your [facility] Health plan. Please reach out to me at the number below. I am available to be of assistance to you regarding your healthcare needs. .   Thank you,   Eula Fried, BSW, MSW, LCSW Managed Medicaid LCSW [email or web address] Phone: [phone]

## 2021-02-27 ENCOUNTER — Ambulatory Visit: Payer: Medicaid Other

## 2021-03-02 ENCOUNTER — Other Ambulatory Visit: Payer: Self-pay

## 2021-03-02 NOTE — Patient Instructions (Signed)
Visit Information  James Padilla was given information about Medicaid Managed Care team care coordination services as a part of their Healthy [facility] Medicaid benefit. James Padilla verbally consented to engagement with the [facility] Managed Care team.   For questions related to your Healthy [facility] health plan, please call: [phone] or visit the homepage here: [email or web address]  If you would like to schedule transportation through your Healthy [facility] plan, please call the following number at least 2 days in advance of your appointment: [phone]  Call the [place] at [phone], at any time, 24 hours a day, 7 days a week. If you are in danger or need immediate medical attention call 911.  If you would like help to quit smoking, call 1-800-QUIT-NOW 956-501-6447) OR Espaol: 1-855-Djelo-Ya (2-595-638-7564) o para ms informacin haga clic aqu or Text READY to 200-400 to register via text  James Padilla - following are the goals we discussed in your visit today:   Goals Addressed   None       Social Worker will follow up with patient in 30 days.   James Padilla, BSW, [place]  High Risk Managed Medicaid Team  [phone]   Following is a copy of your plan of care:  Patient Care Plan: General Social Work (Adult)     Problem Identified: Issues managing finances, health and mental health care   Priority: High  Note:    Timeframe:  Long-Range Goal Priority:  Medium Start Date:   01/03/21                       Expected End Date: 03/05/21                    Follow Up Date -02/12/21   - begin a notebook of services in my neighborhood or community - call 211 when I need some help - follow-up on any referrals for help I am given - think ahead to make sure my need does not become an emergency - make a note about what I need to have by the phone or take with me, like an  identification card or social security number have a back-up plan    Why is this important?   Knowing how and where to find help for yourself or family in your neighborhood and community is an important skill.  You will want to take some steps to learn how.     CARE PLAN ENTRY (see longitudinal plan of care for additional care plan information)  Current Barriers:  Patient with Depression, Homelessness, Financial Strain and Substance Abuse and is in need of assistance with connection to community resources  Knowledge deficits and need for support, education and care coordination related to community resources support  Limited social support and Housing barriers  Clinical Goal(s)  Over the next 120 days, patient will work with care management team member to address concerns related to community resource needs Clinical Goal(s): Over the next 120 days, patient will work with SW to reduce or manage symptoms of stress and increase knowledge and/or ability of: coping skills, healthy habits, self-management skills, and stress reduction.until connected for ongoing counseling.   Interventions provided by LCSW:  Assessed patient's care coordination needs related to housing and discussed ongoing care management follow up  Patient has been having ongoing financial issues since he has been out of work since September of 2019.  Patient has a history  of using cocaine and admits to ongoing usage but denies wanting [facility] LCSW to make referral for substance abuse treatment at this time. Patient reports that his last usage of cocaine was 01/20/21. He is still adamant about not receiving mental health or substance abuse treatment at this time but would like to work on his coping skills.  Patient reports receiving $225 in food stamps per month.  Patient reports having difficulty managing his health care expenses, medications and bills. [facility] LCSW will make referral for [facility] BSW on 01/03/21. Patient missed call from [facility]  BSW and patient was advised to return her call today for community resource support and connection. Patient agreeable to do so.  Patient reports that he is stable where he resides but wishes to find his own residence. He reports that he lives with his friend and his friend's elderly mother. Provided patient with information about the [name] as well as an [place] patient to consider the above housing options and possibility to work as part of the ongoing program requirements  Assessed patient's previous treatment, needs, coping skills, current treatment, support system and barriers to care  Other interventions: Solution-Focused Strategies, Mindfulness or Psychologist, educational, Active listening / Reflection utilized , Emotional Supportive Provided, Motivational Interviewing, Brief CBT , Participation in support group encouraged , and Suicidal Ideation/Homicidal Ideation assessed: ; Past referral made to [facility] BSW Patient interviewed and appropriate assessments performed Discussed plans with patient for ongoing care management follow up and provided patient with direct contact information for care management team Assisted patient/caregiver with obtaining information about health plan benefits Provided education and assistance to client regarding Advanced Directives. LCSW discussed coping skills for anxiety. SW used empathetic and active and reflective listening, validated patient's feelings/concerns, and provided emotional support. LCSW provided self-care examples to help patient manage their multiple health conditions and improve his mood. Patient admits that his anger gets the best of him sometimes.  Discussed several options for long term counseling based on need and insurance but patient denies wanting a referral to both substance abuse and mental health treatment again on 01/25/21. Patient also refuses the need for a referral to [facility] RNCM or Pharmacist at this time.   Inter-disciplinary care team collaboration (see longitudinal plan of care) [facility] LCSW will update [place] on 01/11/21 01/17/21: BSW spoke with patient about his financial strains. Patient states he has already been approved for disability/Ssi but has not started receiving yet due to some additional stipulations. Patient states he is working with a company to assist him with getting his disability but could not remember the name. Patient states he is waiting on a letter to come from them.  Patient does not have to be out of the home he is staying at right now but wants a peace of mind. Patient does not want to go into any type of shelter/substance abuse home. BSW will start working on a list of properties in [place] for patient to contact once he starts receiving his disability. 02/06/21: BSW followed up with patient about receiving his payments. Patient stated he did receive some paperwork from [place] yesterday and is going to a friends house tonight to get assistance with completing the paperwork. 03/02/21: BSW spoke patient and patient did turn in his disability paperwork, but has not received a decision back yet. Patient states he was changing a tire on Wednesday and his back has been hurting since. BSW advised patient to contact his PCP on Monday if his pain  gets worse.    Patient Self Care Activities & Deficits:  Patient is unable to independently navigate community resource options without care coordination support  Acknowledges deficits and is motivated to resolve concern  Patient will consider an [place] or the [name] as discussed today Unable to perform IADLs independently Motivation for treatment  Patient Goals/Self-Care Activities: Over the next 120 days Call your insurance provider to discuss transportation options Call your insurance provider for more information about your Enhanced Benefits  Continue with compliance of taking medication

## 2021-03-02 NOTE — Patient Outreach (Signed)
Medicaid Managed Care Social Work Note  03/02/2021 Name:  James Padilla MRN:  [id number] DOB:  February 29, 1966  James Padilla is an 55 y.o. year old male who is a primary patient of James Sizer, MD.  The Medicaid Managed Care Coordination team was consulted for assistance with:   disability  James Padilla was given information about Medicaid Managed CareCoordination services today. James Padilla agreed to services and verbal consent obtained.  Engaged with patient  for by telephone forfollow up visit in response to referral for case management and/or care coordination services.   Assessments/Interventions:  Review of past medical history, allergies, medications, health status, including review of consultants reports, laboratory and other test data, was performed as part of comprehensive evaluation and provision of chronic care management services.  SDOH: (Social Determinant of Health) assessments and interventions performed:  BSW spoke patient and patient did turn in his disabliity paperwork, but has not received a decision back yet. Patient states he was changing a tire on Wednesday and his back has been hurting since. BSW advised patient to contact his PCP on Monday if his pain gets worse.    Advanced Directives Status:  Not addressed in this encounter.  Care Plan                 Allergies  Allergen Reactions   Septra [Sulfamethoxazole-Trimethoprim] Other (See Comments)    Renal Failure   Lactose Nausea And Vomiting    Medications Reviewed Today     Reviewed by James Padilla, [place] (Certified Medical Assistant) on 01/09/21 at 1017  Med List Status: <None>   Medication Order Taking? Sig Documenting Provider Last Dose Status Informant  aspirin EC 81 MG tablet [id number] Yes Take 1 tablet (81 mg total) by mouth daily. James Sizer, MD Taking Active   atenolol (TENORMIN) 25 MG tablet [id number] Yes Take 1 tablet (25 mg total) by mouth every evening. James Sizer, MD Taking Active    atorvastatin (LIPITOR) 20 MG tablet [id number] Yes Take 1 tablet (20 mg total) by mouth daily. For your heart James Sizer, MD Taking Active   divalproex (DEPAKOTE) 250 MG DR tablet [id number] Yes Take 1 tablet (250 mg total) by mouth in the morning and at bedtime. For mood James Sizer, MD Taking Active   gabapentin (NEURONTIN) 300 MG capsule [id number] Yes Take 1 capsule (300 mg total) by mouth 3 (three) times daily. James Sizer, MD Taking Active   losartan-hydrochlorothiazide [facility]) 50-12.5 MG tablet [id number] Yes Take 1 tablet by mouth daily. For blood pressure James Sizer, MD Taking Active   umeclidinium-vilanterol [facility] ELLIPTA) 62.5-25 MCG/INH AEPB [id number] Yes Inhale 1 puff into the lungs daily. James Sizer, MD Taking Active             Patient Active Problem List   Diagnosis Date Noted   Centrilobular emphysema ([place]) 10/04/2020   Atherosclerosis of aorta ([place]) 10/04/2020   CAD in native artery 10/04/2020   Neuropathy 08/29/2020   Essential hypertension 01/04/2019   Crack cocaine use 12/17/2018   Sebaceous cyst 12/17/2018   Nonintractable episodic headache 10/05/2018   Frequency of urination and polyuria 10/05/2018   Encounter for tobacco use cessation counseling 10/05/2018   Severe episode of recurrent major depressive disorder, without psychotic features ([place]) 10/05/2018   Irritability and anger 10/05/2018   Homelessness 09/19/2018   Chronic neck pain 09/19/2018   Chronic bilateral back pain 09/19/2018   Right arm pain 08/14/2018    Conditions to be addressed/monitored per PCP order:  disablity   Care Plan : General Social Work (Adult)  Updates made by James Padilla since 03/02/2021 12:00 AM     Problem: Issues managing finances, health and mental health care   Priority: High  Note:    Timeframe:  Long-Range Goal Priority:  Medium Start Date:   01/03/21                       Expected End Date: 03/05/21                    Follow Up Date  -02/12/21   - begin a notebook of services in my neighborhood or community - call 211 when I need some help - follow-up on any referrals for help I am given - think ahead to make sure my need does not become an emergency - make a note about what I need to have by the phone or take with me, like an identification card or social security number have a back-up plan    Why is this important?   Knowing how and where to find help for yourself or family in your neighborhood and community is an important skill.  You will want to take some steps to learn how.     CARE PLAN ENTRY (see longitudinal plan of care for additional care plan information)  Current Barriers:  Patient with Depression, Homelessness, Financial Strain and Substance Abuse and is in need of assistance with connection to community resources  Knowledge deficits and need for support, education and care coordination related to community resources support  Limited social support and Housing barriers  Clinical Goal(s)  Over the next 120 days, patient will work with care management team member to address concerns related to community resource needs Clinical Goal(s): Over the next 120 days, patient will work with SW to reduce or manage symptoms of stress and increase knowledge and/or ability of: coping skills, healthy habits, self-management skills, and stress reduction.until connected for ongoing counseling.   Interventions provided by LCSW:  Assessed patient's care coordination needs related to housing and discussed ongoing care management follow up  Patient has been having ongoing financial issues since he has been out of work since September of 2019.  Patient has a history of using cocaine and admits to ongoing usage but denies wanting [facility] LCSW to make referral for substance abuse treatment at this time. Patient reports that his last usage of cocaine was 01/20/21. He is still adamant about not receiving mental health or substance abuse  treatment at this time but would like to work on his coping skills.  Patient reports receiving $225 in food stamps per month.  Patient reports having difficulty managing his health care expenses, medications and bills. [facility] LCSW will make referral for [facility] BSW on 01/03/21. Patient missed call from [facility] BSW and patient was advised to return her call today for community resource support and connection. Patient agreeable to do so.  Patient reports that he is stable where he resides but wishes to find his own residence. He reports that he lives with his friend and his friend's elderly mother. Provided patient with information about the [name] as well as an [place] patient to consider the above housing options and possibility to work as part of the ongoing program requirements  Assessed patient's previous treatment, needs, coping skills, current treatment, support system and barriers to care  Other interventions: Solution-Focused Strategies, Mindfulness or Relaxation Training, Active listening /  Reflection utilized , Emotional Supportive Provided, Motivational Interviewing, Brief CBT , Participation in support group encouraged , and Suicidal Ideation/Homicidal Ideation assessed: ; Past referral made to [facility] BSW Patient interviewed and appropriate assessments performed Discussed plans with patient for ongoing care management follow up and provided patient with direct contact information for care management team Assisted patient/caregiver with obtaining information about health plan benefits Provided education and assistance to client regarding Advanced Directives. LCSW discussed coping skills for anxiety. SW used empathetic and active and reflective listening, validated patient's feelings/concerns, and provided emotional support. LCSW provided self-care examples to help patient manage their multiple health conditions and improve his mood. Patient admits that his anger gets the best of  him sometimes.  Discussed several options for long term counseling based on need and insurance but patient denies wanting a referral to both substance abuse and mental health treatment again on 01/25/21. Patient also refuses the need for a referral to [facility] RNCM or Pharmacist at this time.  Inter-disciplinary care team collaboration (see longitudinal plan of care) [facility] LCSW will update [place] on 01/11/21 01/17/21: BSW spoke with patient about his financial strains. Patient states he has already been approved for disability/Ssi but has not started receiving yet due to some additional stipulations. Patient states he is working with a company to assist him with getting his disability but could not remember the name. Patient states he is waiting on a letter to come from them.  Patient does not have to be out of the home he is staying at right now but wants a peace of mind. Patient does not want to go into any type of shelter/substance abuse home. BSW will start working on a list of properties in [place] for patient to contact once he starts receiving his disability. 02/06/21: BSW followed up with patient about receiving his payments. Patient stated he did receive some paperwork from [place] yesterday and is going to a friends house tonight to get assistance with completing the paperwork. 03/02/21: BSW spoke patient and patient did turn in his disability paperwork, but has not received a decision back yet. Patient states he was changing a tire on Wednesday and his back has been hurting since. BSW advised patient to contact his PCP on Monday if his pain gets worse.    Patient Self Care Activities & Deficits:  Patient is unable to independently navigate community resource options without care coordination support  Acknowledges deficits and is motivated to resolve concern  Patient will consider an [employer] or the [name] as discussed today Unable to perform IADLs independently Motivation for  treatment  Patient Goals/Self-Care Activities: Over the next 120 days Call your insurance provider to discuss transportation options Call your insurance provider for more information about your Enhanced Benefits  Continue with compliance of taking medication        Follow up:  Patient agrees to Care Plan and Follow-up.  Plan: The Managed Medicaid care management team will reach out to the patient again over the next 30 days.  Date/time of next scheduled Social Work care management/care coordination outreach:  04/02/21  [name], Arita Miss, [place] Medicaid Team  [phone]

## 2021-03-05 ENCOUNTER — Other Ambulatory Visit: Payer: Self-pay | Admitting: Licensed Clinical Social Worker

## 2021-03-05 NOTE — Patient Outreach (Signed)
  [facility]) Care Management  [facility] Social Work  03/05/2021  MAKANA ROSTAD 1966-06-08 [id number]  Encounter Medications:  Outpatient Encounter Medications as of 03/05/2021  Medication Sig   aspirin EC 81 MG tablet Take 1 tablet (81 mg total) by mouth daily.   atenolol (TENORMIN) 25 MG tablet Take 1 tablet (25 mg total) by mouth every evening.   atorvastatin (LIPITOR) 20 MG tablet Take 1 tablet (20 mg total) by mouth daily. For your heart   divalproex (DEPAKOTE) 250 MG DR tablet Take 1 tablet (250 mg total) by mouth in the morning and at bedtime. For mood   Fluticasone-Umeclidin-Vilant (TRELEGY ELLIPTA) 100-62.5-25 MCG/INH AEPB Inhale 1 puff into the lungs daily.   gabapentin (NEURONTIN) 300 MG capsule Take 1 capsule (300 mg total) by mouth 3 (three) times daily.   losartan-hydrochlorothiazide (HYZAAR) 50-12.5 MG tablet Take 1 tablet by mouth daily. For blood pressure   triamcinolone cream (KENALOG) 0.1 % Apply 1 application topically 2 (two) times daily.   No facility-administered encounter medications on file as of 03/05/2021.    Functional Status:  In your present state of health, do you have any difficulty performing the following activities: 01/09/2021 11/09/2020  Hearing? Y N  Vision? Y Y  Difficulty concentrating or making decisions? N N  Walking or climbing stairs? Y Y  Dressing or bathing? Y Y  Doing errands, shopping? Y N  Some recent data might be hidden    Fall/Depression Screening:  PHQ 2/9 Scores 01/11/2021 01/09/2021 12/01/2020 11/09/2020 10/04/2020 08/29/2020 06/27/2020  PHQ - 2 Score 2 3 2 3 4 2 4   PHQ- 9 Score 8 9 7 6 8 5  -    Assessment:  Care Plan There are no care plans that you recently modified to display for this patient.    Goals Addressed   None     LCSW completed second [facility] outreach attempt today but was unable to reach patient successfully. A HIPPA compliant voice message was left encouraging patient to return call once available. LCSW  will reschedule patient's [facility] Social Work appointment if no return call has been made.  Eula Fried, BSW, MSW, [employer] Managed Medicaid LCSW [email or web address] Phone: [phone]

## 2021-03-05 NOTE — Patient Instructions (Signed)
[name] ,   The Medicaid Managed Care Team is available to provide assistance to you with your healthcare needs at no cost and as a benefit of your [facility] Health plan. Please reach out to me at the number below. I am available to be of assistance to you regarding your healthcare needs. .   Thank you,   Eula Fried, BSW, MSW, LCSW Managed Medicaid LCSW [email or web address] Phone: [phone]

## 2021-03-09 ENCOUNTER — Ambulatory Visit: Payer: Self-pay

## 2021-03-09 ENCOUNTER — Telehealth: Payer: Self-pay | Admitting: Licensed Clinical Social Worker

## 2021-03-09 NOTE — Patient Outreach (Signed)
  [facility]) Care Management  [facility] Social Work  03/09/2021  WINDLE HUEBERT 09/21/1965 [id number]  Encounter Medications:  Outpatient Encounter Medications as of 03/09/2021  Medication Sig   aspirin EC 81 MG tablet Take 1 tablet (81 mg total) by mouth daily.   atenolol (TENORMIN) 25 MG tablet Take 1 tablet (25 mg total) by mouth every evening.   atorvastatin (LIPITOR) 20 MG tablet Take 1 tablet (20 mg total) by mouth daily. For your heart   divalproex (DEPAKOTE) 250 MG DR tablet Take 1 tablet (250 mg total) by mouth in the morning and at bedtime. For mood   Fluticasone-Umeclidin-Vilant (TRELEGY ELLIPTA) 100-62.5-25 MCG/INH AEPB Inhale 1 puff into the lungs daily.   gabapentin (NEURONTIN) 300 MG capsule Take 1 capsule (300 mg total) by mouth 3 (three) times daily.   losartan-hydrochlorothiazide (HYZAAR) 50-12.5 MG tablet Take 1 tablet by mouth daily. For blood pressure   triamcinolone cream (KENALOG) 0.1 % Apply 1 application topically 2 (two) times daily.   No facility-administered encounter medications on file as of 03/09/2021.    Functional Status:  In your present state of health, do you have any difficulty performing the following activities: 01/09/2021 11/09/2020  Hearing? Y N  Vision? Y Y  Difficulty concentrating or making decisions? N N  Walking or climbing stairs? Y Y  Dressing or bathing? Y Y  Doing errands, shopping? Y N  Some recent data might be hidden    Fall/Depression Screening:  PHQ 2/9 Scores 01/11/2021 01/09/2021 12/01/2020 11/09/2020 10/04/2020 08/29/2020 06/27/2020  PHQ - 2 Score 2 3 2 3 4 2 4   PHQ- 9 Score 8 9 7 6 8 5  -    Assessment:  Care Plan There are no care plans that you recently modified to display for this patient.    Goals Addressed   None    LCSW completed [facility] outreach attempt today but was unable to reach patient successfully. A HIPPA compliant voice message was left encouraging patient to return call once available. LCSW will  reschedule patient's [facility] Social Work appointment if no return call has been made.  Eula Fried, BSW, MSW, [employer] Managed Medicaid LCSW [email or web address] Phone: [phone]

## 2021-03-09 NOTE — Patient Instructions (Signed)
[name] ,   The Medicaid Managed Care Team is available to provide assistance to you with your healthcare needs at no cost and as a benefit of your [facility] Health plan. Please reach out to me at the number below. I am available to be of assistance to you regarding your healthcare needs. .   Thank you,   Eula Fried, BSW, MSW, LCSW Managed Medicaid LCSW [email or web address] Phone: [phone]

## 2021-03-19 NOTE — Progress Notes (Signed)
Name: James Padilla   MRN: [id number]    DOB: Jul 02, 1966   Date:03/20/2021       Progress Note  Subjective  Chief Complaint  Back Pain  HPI  Acute on Chronic back pain: it flared 10 days ago, he states he mowed two yards on the same day, he states the day after he could not get up to go to church , he states pain is still constant and intense but not as bad as it was the day after the pain. Improves with heat, it gets worse when walking or moving his spine/twisting. He has a long history of DDD and is on gabapentin but is not working for this pain. Explained I cannot give him narcotics due to his history of addiction. He denies bowel or bladder incontinence, no saddle anesthesia   Crack cocaine use: last time he smoked was Sunday.   Patient Active Problem List   Diagnosis Date Noted   Centrilobular emphysema ([place]) 10/04/2020   Atherosclerosis of aorta ([place]) 10/04/2020   CAD in native artery 10/04/2020   Neuropathy 08/29/2020   Essential hypertension 01/04/2019   Crack cocaine use 12/17/2018   Sebaceous cyst 12/17/2018   Nonintractable episodic headache 10/05/2018   Frequency of urination and polyuria 10/05/2018   Encounter for tobacco use cessation counseling 10/05/2018   Severe episode of recurrent major depressive disorder, without psychotic features ([place]) 10/05/2018   Irritability and anger 10/05/2018   Homelessness 09/19/2018   Chronic neck pain 09/19/2018   Chronic bilateral back pain 09/19/2018   Right arm pain 08/14/2018    Past Surgical History:  Procedure Laterality Date   COLONOSCOPY WITH PROPOFOL N/A 10/15/2018   Procedure: COLONOSCOPY WITH PROPOFOL;  Surgeon: Jonathon Bellows, MD;  Location: [facility] ENDOSCOPY;  Service: Gastroenterology;  Laterality: N/A;   MULTIPLE TOOTH EXTRACTIONS      Family History  Problem Relation Age of Onset   Breast cancer Mother    Aneurysm Father     Social History   Tobacco Use   Smoking status: Some Days    Packs/day: 0.25    Years:  40.00    Pack years: 10.00    Types: Cigarettes   Smokeless tobacco: Never   Tobacco comments:    currently smoking .25 ppd  Substance Use Topics   Alcohol use: Not Currently    Comment: rarely      Current Outpatient Medications:    aspirin EC 81 MG tablet, Take 1 tablet (81 mg total) by mouth daily., Disp: 30 tablet, Rfl: 0   atenolol (TENORMIN) 25 MG tablet, Take 1 tablet (25 mg total) by mouth every evening., Disp: 90 tablet, Rfl: 0   atorvastatin (LIPITOR) 20 MG tablet, Take 1 tablet (20 mg total) by mouth daily. For your heart, Disp: 90 tablet, Rfl: 1   divalproex (DEPAKOTE) 250 MG DR tablet, Take 1 tablet (250 mg total) by mouth in the morning and at bedtime. For mood, Disp: 180 tablet, Rfl: 1   Fluticasone-Umeclidin-Vilant (TRELEGY ELLIPTA) 100-62.5-25 MCG/INH AEPB, Inhale 1 puff into the lungs daily., Disp: 3 each, Rfl: 1   gabapentin (NEURONTIN) 300 MG capsule, Take 1 capsule (300 mg total) by mouth 3 (three) times daily., Disp: 270 capsule, Rfl: 1   losartan-hydrochlorothiazide (HYZAAR) 50-12.5 MG tablet, Take 1 tablet by mouth daily. For blood pressure, Disp: 90 tablet, Rfl: 0   triamcinolone cream (KENALOG) 0.1 %, Apply 1 application topically 2 (two) times daily., Disp: 30 g, Rfl: 0  Allergies  Allergen Reactions  Septra [Sulfamethoxazole-Trimethoprim] Other (See Comments)    Renal Failure   Lactose Nausea And Vomiting    I personally reviewed active problem list, medication list, allergies, family history, social history, health maintenance with the patient/caregiver today.   ROS  Ten systems reviewed and is negative except as mentioned in HPI   Objective  Vitals:   03/20/21 0844  BP: 120/70  Pulse: 62  Resp: 16  Temp: 97.6 F (36.4 C)  SpO2: 96%  Weight: 120 lb (54.4 kg)  Height: '5\' 5"'$  (1.651 m)    Body mass index is 19.97 kg/m.  Physical Exam  Constitutional: Patient appears well-developed and well-nourished.No distress.  HEENT: head  atraumatic, normocephalic, pupils equal and reactive to light, neck supple Cardiovascular: Normal rate, regular rhythm and normal heart sounds.  No murmur heard. No BLE edema. Pulmonary/Chest: Effort normal and breath sounds normal. No respiratory distress. Abdominal: Soft.  There is no tenderness. Psychiatric: Patient has a normal mood and affect. behavior is normal. Judgment and thought content normal.  Muscular Skeletal: tender during palpation of lumbar spine, walking slowly, positive straight leg raise right side   PHQ2/9: Depression screen [facility] 2/9 03/20/2021 01/11/2021 01/09/2021 12/01/2020 11/09/2020  Decreased Interest '2 1 2 '$ 0 0  Down, Depressed, Hopeless '2 1 1 2 3  '$ PHQ - 2 Score '4 2 3 2 3  '$ Altered sleeping '3 2 2 1 1  '$ Tired, decreased energy '2 2 2 1 1  '$ Change in appetite 0 2 2 0 0  Feeling bad or failure about yourself  1 0 0 1 1  Trouble concentrating 0 0 0 1 0  Moving slowly or fidgety/restless 0 0 0 1 0  Suicidal thoughts 0 0 0 0 0  PHQ-9 Score '10 8 9 7 6  '$ Difficult doing work/chores - Somewhat difficult - - -  Some recent data might be hidden    phq 9 is positive    Fall Risk: Fall Risk  03/20/2021 01/09/2021 11/09/2020 10/10/2020 10/04/2020  Falls in the past year? 1 0 '1 1 1  '$ Number falls in past yr: 1 0 0 - 1  Injury with Fall? 0 0 0 - 0  Risk for fall due to : - - - - -  Follow up - - - - -      Functional Status Survey: Is the patient deaf or have difficulty hearing?: No Does the patient have difficulty seeing, even when wearing glasses/contacts?: Yes Does the patient have difficulty concentrating, remembering, or making decisions?: Yes Does the patient have difficulty walking or climbing stairs?: Yes Does the patient have difficulty dressing or bathing?: Yes Does the patient have difficulty doing errands alone such as visiting a doctor's office or shopping?: Yes    Assessment & Plan  1. Acute midline low back pain with bilateral sciatica  - methylPREDNISolone  (MEDROL DOSEPAK) 4 MG TBPK tablet; Take as directed  Dispense: 21 tablet; Refill: 0   Discussed referral to pain clinic, but he is still doing drugs, surgeon is not going to do surgery while he is still using crack cocaine   - tiZANidine (ZANAFLEX) 2 MG tablet; Take 1 tablet (2 mg total) by mouth every 8 (eight) hours as needed for muscle spasms.  Dispense: 20 tablet; Refill: 0   2. Crack cocaine use

## 2021-03-20 ENCOUNTER — Ambulatory Visit: Payer: Medicaid Other | Admitting: Family Medicine

## 2021-03-20 ENCOUNTER — Other Ambulatory Visit: Payer: Self-pay

## 2021-03-20 ENCOUNTER — Encounter: Payer: Self-pay | Admitting: Family Medicine

## 2021-03-20 VITALS — BP 120/70 | HR 62 | Temp 97.6°F | Resp 16 | Ht 65.0 in | Wt 120.0 lb

## 2021-03-20 DIAGNOSIS — M5442 Lumbago with sciatica, left side: Secondary | ICD-10-CM

## 2021-03-20 DIAGNOSIS — M5441 Lumbago with sciatica, right side: Secondary | ICD-10-CM

## 2021-03-20 DIAGNOSIS — F1490 Cocaine use, unspecified, uncomplicated: Secondary | ICD-10-CM | POA: Diagnosis not present

## 2021-03-20 MED ORDER — TIZANIDINE HCL 2 MG PO TABS: 2.0000 mg | ORAL_TABLET | Freq: Three times a day (TID) | ORAL | 0 refills | Status: DC | PRN

## 2021-03-20 MED ORDER — METHYLPREDNISOLONE 4 MG PO TBPK: ORAL_TABLET | ORAL | 0 refills | Status: DC

## 2021-03-22 ENCOUNTER — Ambulatory Visit: Payer: Medicaid Other

## 2021-03-22 ENCOUNTER — Telehealth: Payer: Self-pay | Admitting: Family Medicine

## 2021-03-22 NOTE — Telephone Encounter (Signed)
Pts next appt is 04/11/21

## 2021-03-22 NOTE — Telephone Encounter (Signed)
Pt is calling to discuss with Dr. Ancil Boozer the injection for Acute midline low back pain with bilateral sciatica Pt is open to discuss receiving the shot but would like to discuss stipulations with the nurse please CB- [phone]

## 2021-03-26 ENCOUNTER — Other Ambulatory Visit: Payer: Self-pay | Admitting: Licensed Clinical Social Worker

## 2021-03-26 NOTE — Patient Outreach (Signed)
Medicaid Managed Care Social Work Note  03/26/2021 Name:  James Padilla MRN:  [id number] DOB:  11-13-1965  James Padilla is an 55 y.o. year old male who is a primary patient of James Sizer, MD.  The Medicaid Managed Care Coordination team was consulted for assistance with:  James Padilla and Resources  James Padilla was given information about Medicaid Managed CareCoordination services today. James Padilla agreed to services and verbal consent obtained.  Engaged with patient  for by telephone forfollow up visit in response to referral for case management and/or care coordination services.   Assessments/Interventions:  Review of past medical history, allergies, medications, health status, including review of consultants reports, laboratory and other test data, was performed as part of comprehensive evaluation and provision of chronic care management services.  SDOH: (Social Determinant of Health) assessments and interventions performed: SDOH Interventions    Flowsheet Row Most Recent Value  SDOH Interventions   Stress Interventions Provide Counseling  Alcohol Brief Interventions/Follow-up Patient Refused  Depression Interventions/Treatment  Patient refuses Treatment       Advanced Directives Status:  See Care Plan for related entries.  Care Plan                 Allergies  Allergen Reactions   Septra [Sulfamethoxazole-Trimethoprim] Other (See Comments)    Renal Failure   Lactose Nausea And Vomiting    Medications Reviewed Today     Reviewed by James Padilla, [place] (Certified Medical Assistant) on 03/20/21 at [id number]  Med List Status: <None>   Medication Order Taking? Sig Documenting Provider Last Dose Status Informant  aspirin EC 81 MG tablet [id number] Yes Take 1 tablet (81 mg total) by mouth daily. James Sizer, MD Taking Active   atenolol (TENORMIN) 25 MG tablet [id number] Yes Take 1 tablet (25 mg total) by mouth every evening. James Sizer, MD Taking Active    atorvastatin (LIPITOR) 20 MG tablet [id number] Yes Take 1 tablet (20 mg total) by mouth daily. For your heart James Sizer, MD Taking Active   divalproex (DEPAKOTE) 250 MG DR tablet [id number] Yes Take 1 tablet (250 mg total) by mouth in the morning and at bedtime. For mood James Sizer, MD Taking Active   Fluticasone-Umeclidin-Vilant (TRELEGY ELLIPTA) 100-62.5-25 MCG/INH AEPB [id number] Yes Inhale 1 puff into the lungs daily. James Sizer, MD Taking Active   gabapentin (NEURONTIN) 300 MG capsule [id number] Yes Take 1 capsule (300 mg total) by mouth 3 (three) times daily. James Sizer, MD Taking Active   losartan-hydrochlorothiazide [facility]) 50-12.5 MG tablet [id number] Yes Take 1 tablet by mouth daily. For blood pressure Sowles, Drue Stager, MD Taking Active   triamcinolone cream (KENALOG) 0.1 % [id number] Yes Apply 1 application topically 2 (two) times daily. James Sizer, MD Taking Active             Patient Active Problem List   Diagnosis Date Noted   Centrilobular emphysema ([place]) 10/04/2020   Atherosclerosis of aorta ([place]) 10/04/2020   CAD in native artery 10/04/2020   Neuropathy 08/29/2020   Essential hypertension 01/04/2019   Crack cocaine use 12/17/2018   Sebaceous cyst 12/17/2018   Nonintractable episodic headache 10/05/2018   Frequency of urination and polyuria 10/05/2018   Encounter for tobacco use cessation counseling 10/05/2018   Severe episode of recurrent major depressive disorder, without psychotic features ([place]) 10/05/2018   Irritability and anger 10/05/2018   Homelessness 09/19/2018   Chronic neck pain 09/19/2018   Chronic bilateral back pain 09/19/2018   Right arm pain 08/14/2018  Conditions to be addressed/monitored per PCP order:  Anxiety and Depression  Care Plan : General Social Work (Adult)  Updates made by James Cutter, James Padilla since 03/26/2021 12:00 AM     Problem: Issues managing finances, health and mental health care   Priority: High  Note:     Timeframe:  Long-Range Goal Priority:  Medium Start Date:   01/03/21                       Expected End Date: ongoing            Follow Up Date -04/16/21   - begin a notebook of services in my neighborhood or community - call 211 when I need some help - follow-up on any referrals for help I am given - think ahead to make sure my need does not become an emergency - make a note about what I need to have by the phone or take with me, like an identification card or social security number have a back-up plan    Why is this important?   Knowing how and where to find help for yourself or family in your neighborhood and community is an important skill.  You will want to take some steps to learn how.     CARE PLAN ENTRY (see longitudinal plan of care for additional care plan information)  Current Barriers:  Patient with Depression, Homelessness, Financial Strain and Substance Abuse and is in need of assistance with connection to community resources  Knowledge deficits and need for support, education and care coordination related to community resources support  Limited social support and Housing barriers  Clinical Goal(s)  Over the next 120 days, patient will work with care management team member to address concerns related to community resource needs Clinical Goal(s): Over the next 120 days, patient will work with SW to reduce or manage symptoms of stress and increase knowledge and/or ability of: coping skills, healthy habits, self-management skills, and stress reduction.until connected for ongoing counseling.   Interventions provided by James Padilla:  Assessed patient's care coordination needs related to housing and discussed ongoing care management follow up  Patient has been having ongoing financial issues since he has been out of work since September of 2019.  Patient reports that his brother just paid and got his phone turned back on this morning on 03/26/21 Patient has a history of using  cocaine and admits to ongoing usage but denies wanting [facility] James Padilla to make referral for substance abuse treatment at this time. Patient reports that his last usage of cocaine was 01/20/21. He is still adamant about not receiving mental health or substance abuse treatment at this time but would like to work on his coping skills.  Patient reports receiving $225 in food stamps per month.  Patient reports having difficulty managing his health care expenses, medications and bills. [facility] James Padilla will make referral for [facility] BSW on 01/03/21. Patient missed call from [facility] BSW and patient was advised to return her call today for community resource support and connection. Patient agreeable to do so.  Patient reports that he is stable where he resides but wishes to find his own residence. He reports that he lives with his friend and his friend's elderly mother. Provided patient with information about the [name] as well as an Home patient to consider the above housing options and possibility to work as part of the ongoing program requirements  Assessed patient's previous treatment, needs, coping  skills, current treatment, support system and barriers to care  Other interventions: Solution-Focused Strategies, Mindfulness or Relaxation Training, Active listening / Reflection utilized , Emotional Supportive Provided, Motivational Interviewing, Brief CBT , Participation in support group encouraged , and Suicidal Ideation/Homicidal Ideation assessed: ; Past referral made to [facility] BSW for housing assistance Patient interviewed and appropriate assessments performed Discussed plans with patient for ongoing care management follow up and provided patient with direct contact information for care management team Assisted patient/caregiver with obtaining information about health plan benefits Provided education and assistance to client regarding Advanced Directives. James Padilla discussed coping skills for anxiety and  depression. SW used empathetic and active and reflective listening, validated patient's feelings/concerns, and provided emotional support. James Padilla provided self-care examples to help patient manage their multiple health conditions and improve his mood. Patient admits that his anger gets the best of him sometimes.  Discussed several options for long term counseling based on need and insurance but patient denies wanting a referral to both substance abuse and mental health treatment again on 03/26/21 Patient also refuses the need for a referral to [place] or Pharmacist at this time.  Inter-disciplinary care team collaboration (see longitudinal plan of care) [facility] James Padilla will update [place] on 01/11/21 01/17/21: BSW spoke with patient about his financial strains. Patient states he has already been approved for disability/Ssi but has not started receiving yet due to some additional stipulations. Patient states he is working with a company to assist him with getting his disability but could not remember the name. Patient states he is waiting on a letter to come from them.  Patient does not have to be out of the home he is staying at right now but wants a peace of mind. Patient does not want to go into any type of shelter/substance abuse home. BSW will start working on a list of properties in [place] for patient to contact once he starts receiving his disability. 02/06/21: BSW followed up with patient about receiving his payments. Patient stated he did receive some paperwork from [place] yesterday and is going to a friends house tonight to get assistance with completing the paperwork. 03/02/21: BSW spoke patient and patient did turn in his disability paperwork, but has not received a decision back yet. Patient states he was changing a tire on Wednesday and his back has been hurting since. BSW advised patient to contact his PCP on Monday if his pain gets worse.    Patient Self Care Activities & Deficits:  Patient is  unable to independently navigate community resource options without care coordination support  Acknowledges deficits and is motivated to resolve concern  Patient will consider an [employer] or the [name] as discussed today Unable to perform IADLs independently Motivation for treatment  Patient Goals/Self-Care Activities: Over the next 120 days Call your insurance provider to discuss transportation options Call your insurance provider for more information about your Enhanced Benefits  Continue with compliance of taking medication        Follow up:  Patient agrees to Care Plan and Follow-up.  Plan: The Managed Medicaid care management team will reach out to the patient again over the next 30 days.  Date of next scheduled Social Work care management/care coordination outreach:  04/16/21  Eula Fried, BSW, MSW, James Padilla Managed Medicaid James Padilla [email or web address] Phone: [phone]

## 2021-03-26 NOTE — Patient Instructions (Signed)
Visit Information  James Padilla was given information about Medicaid Managed Care team care coordination services as a part of their Healthy [facility] Medicaid benefit. James Padilla verbally consented to engagement with the [facility] Managed Care team.   If you are experiencing a medical emergency, please call 911 or report to your local emergency department or urgent care.   If you have a non-emergency medical problem during routine business hours, please contact your provider's office and ask to speak with a nurse.   For questions related to your Healthy [facility] health plan, please call: [phone] or visit the homepage here: [email or web address]  If you would like to schedule transportation through your Healthy [facility] plan, please call the following number at least 2 days in advance of your appointment: [phone]  Call the [place] at [phone], at any time, 24 hours a day, 7 days a week. If you are in danger or need immediate medical attention call 911.  If you would like help to quit smoking, call 1-800-QUIT-NOW (360)648-1675) OR Espaol: 1-855-Djelo-Ya QO:409462) o para ms informacin haga clic aqu or Text READY to 200-400 to register via text  [name] - following are the goals we discussed in your visit today:   Goals Addressed             This Visit's Progress    Find Help in My Community        Timeframe:  Long-Range Goal Priority:  Medium Start Date:   01/03/21                       Expected End Date: 03/05/21                    Follow Up Date -04/16/21   - begin a notebook of services in my neighborhood or community - call 211 when I need some help - follow-up on any referrals for help I am given - think ahead to make sure my need does not become an emergency - make a note about what I need to have by the phone or take with me, like an identification card or social security number have a  back-up plan    Why is this important?   Knowing how and where to find help for yourself or family in your neighborhood and community is an important skill.  You will want to take some steps to learn how.    Notes:         Eula Fried, BSW, MSW, [employer] Managed Medicaid [email or web address] Phone: [phone]

## 2021-04-02 ENCOUNTER — Other Ambulatory Visit: Payer: Self-pay

## 2021-04-02 NOTE — Patient Outreach (Signed)
Medicaid Managed Care Social Work Note  04/02/2021 Name:  James Padilla MRN:  [id number] DOB:  December 06, 1965  James Padilla is an 55 y.o. year old male who is a primary patient of Steele Sizer, MD.  The Medicaid Managed Care Coordination team was consulted for assistance with:   disability assistance  James Padilla was given information about Medicaid Managed CareCoordination services today. James Padilla agreed to services and verbal consent obtained.  Engaged with patient  for by telephone forfollow up visit in response to referral for case management and/or care coordination services.   Assessments/Interventions:  Review of past medical history, allergies, medications, health status, including review of consultants reports, laboratory and other test data, was performed as part of comprehensive evaluation and provision of chronic care management services.  SDOH: (Social Determinant of Health) assessments and interventions performed:  BSW completed follow up with patient. Patient states he did get his paperwork submitted but hasn't heard anything for disability. Patient states he did receive some documents from [place] and will ask someone to assist him with completing the paperwork. Patient states no other resources are needed at this time.   Advanced Directives Status:  Not addressed in this encounter.  Care Plan                 Allergies  Allergen Reactions   Septra [Sulfamethoxazole-Trimethoprim] Other (See Comments)    Renal Failure   Lactose Nausea And Vomiting    Medications Reviewed Today     Reviewed by James Padilla, [place] (Certified Medical Assistant) on 03/20/21 at [phone]  Med List Status: <None>   Medication Order Taking? Sig Documenting Provider Last Dose Status Informant  aspirin EC 81 MG tablet [id number] Yes Take 1 tablet (81 mg total) by mouth daily. Steele Sizer, MD Taking Active   atenolol (TENORMIN) 25 MG tablet [id number] Yes Take 1 tablet (25 mg total) by mouth every  evening. Steele Sizer, MD Taking Active   atorvastatin (LIPITOR) 20 MG tablet [id number] Yes Take 1 tablet (20 mg total) by mouth daily. For your heart Steele Sizer, MD Taking Active   divalproex (DEPAKOTE) 250 MG DR tablet [id number] Yes Take 1 tablet (250 mg total) by mouth in the morning and at bedtime. For mood Steele Sizer, MD Taking Active   Fluticasone-Umeclidin-Vilant (TRELEGY ELLIPTA) 100-62.5-25 MCG/INH AEPB [id number] Yes Inhale 1 puff into the lungs daily. Steele Sizer, MD Taking Active   gabapentin (NEURONTIN) 300 MG capsule [id number] Yes Take 1 capsule (300 mg total) by mouth 3 (three) times daily. Steele Sizer, MD Taking Active   losartan-hydrochlorothiazide [facility]) 50-12.5 MG tablet [id number] Yes Take 1 tablet by mouth daily. For blood pressure Sowles, Drue Stager, MD Taking Active   triamcinolone cream (KENALOG) 0.1 % [id number] Yes Apply 1 application topically 2 (two) times daily. Steele Sizer, MD Taking Active             Patient Active Problem List   Diagnosis Date Noted   Centrilobular emphysema ([place]) 10/04/2020   Atherosclerosis of aorta ([place]) 10/04/2020   CAD in native artery 10/04/2020   Neuropathy 08/29/2020   Essential hypertension 01/04/2019   Crack cocaine use 12/17/2018   Sebaceous cyst 12/17/2018   Nonintractable episodic headache 10/05/2018   Frequency of urination and polyuria 10/05/2018   Encounter for tobacco use cessation counseling 10/05/2018   Severe episode of recurrent major depressive disorder, without psychotic features ([place]) 10/05/2018   Irritability and anger 10/05/2018   Homelessness 09/19/2018   Chronic neck pain 09/19/2018  Chronic bilateral back pain 09/19/2018   Right arm pain 08/14/2018    Conditions to be addressed/monitored per PCP order:   disability and housing  Care Plan : General Social Work (Adult)  Updates made by [name] since 04/02/2021 12:00 AM     Problem: Issues managing finances, health and  mental health care   Priority: High  Note:    Timeframe:  Long-Range Goal Priority:  Medium Start Date:   01/03/21                       Expected End Date: ongoing            Follow Up Date -04/16/21   - begin a notebook of services in my neighborhood or community - call 211 when I need some help - follow-up on any referrals for help I am given - think ahead to make sure my need does not become an emergency - make a note about what I need to have by the phone or take with me, like an identification card or social security number have a back-up plan    Why is this important?   Knowing how and where to find help for yourself or family in your neighborhood and community is an important skill.  You will want to take some steps to learn how.     CARE PLAN ENTRY (see longitudinal plan of care for additional care plan information)  Current Barriers:  Patient with Depression, Homelessness, Financial Strain and Substance Abuse and is in need of assistance with connection to community resources  Knowledge deficits and need for support, education and care coordination related to community resources support  Limited social support and Housing barriers  Clinical Goal(s)  Over the next 120 days, patient will work with care management team member to address concerns related to community resource needs Clinical Goal(s): Over the next 120 days, patient will work with SW to reduce or manage symptoms of stress and increase knowledge and/or ability of: coping skills, healthy habits, self-management skills, and stress reduction.until connected for ongoing counseling.   Interventions provided by LCSW:  Assessed patient's care coordination needs related to housing and discussed ongoing care management follow up  Patient has been having ongoing financial issues since he has been out of work since September of 2019.  Patient reports that his brother just paid and got his phone turned back on this morning  on 03/26/21 Patient has a history of using cocaine and admits to ongoing usage but denies wanting [facility] LCSW to make referral for substance abuse treatment at this time. Patient reports that his last usage of cocaine was 01/20/21. He is still adamant about not receiving mental health or substance abuse treatment at this time but would like to work on his coping skills.  Patient reports receiving $225 in food stamps per month.  Patient reports having difficulty managing his health care expenses, medications and bills. [facility] LCSW will make referral for [facility] BSW on 01/03/21. Patient missed call from [facility] BSW and patient was advised to return her call today for community resource support and connection. Patient agreeable to do so.  Patient reports that he is stable where he resides but wishes to find his own residence. He reports that he lives with his friend and his friend's elderly mother. Provided patient with information about the [name] as well as an [place] patient to consider the above housing options and possibility to work  as part of the ongoing program requirements  Assessed patient's previous treatment, needs, coping skills, current treatment, support system and barriers to care  Other interventions: Solution-Focused Strategies, Mindfulness or Relaxation Training, Active listening / Reflection utilized , Emotional Supportive Provided, Motivational Interviewing, Brief CBT , Participation in support group encouraged , and Suicidal Ideation/Homicidal Ideation assessed: ; Past referral made to [facility] BSW for housing assistance Patient interviewed and appropriate assessments performed Discussed plans with patient for ongoing care management follow up and provided patient with direct contact information for care management team Assisted patient/caregiver with obtaining information about health plan benefits Provided education and assistance to client regarding Advanced Directives. LCSW  discussed coping skills for anxiety and depression. SW used empathetic and active and reflective listening, validated patient's feelings/concerns, and provided emotional support. LCSW provided self-care examples to help patient manage their multiple health conditions and improve his mood. Patient admits that his anger gets the best of him sometimes.  Discussed several options for long term counseling based on need and insurance but patient denies wanting a referral to both substance abuse and mental health treatment again on 03/26/21 Patient also refuses the need for a referral to [place] or Pharmacist at this time.  Inter-disciplinary care team collaboration (see longitudinal plan of care) [facility] LCSW will update [place] on 01/11/21 01/17/21: BSW spoke with patient about his financial strains. Patient states he has already been approved for disability/Ssi but has not started receiving yet due to some additional stipulations. Patient states he is working with a company to assist him with getting his disability but could not remember the name. Patient states he is waiting on a letter to come from them.  Patient does not have to be out of the home he is staying at right now but wants a peace of mind. Patient does not want to go into any type of shelter/substance abuse home. BSW will start working on a list of properties in [place] for patient to contact once he starts receiving his disability. 02/06/21: BSW followed up with patient about receiving his payments. Patient stated he did receive some paperwork from [place] yesterday and is going to a friends house tonight to get assistance with completing the paperwork. 03/02/21: BSW spoke patient and patient did turn in his disability paperwork, but has not received a decision back yet. Patient states he was changing a tire on Wednesday and his back has been hurting since. BSW advised patient to contact his PCP on Monday if his pain gets worse.  04/02/21: BSW completed  follow up with patient. Patient states he did get his paperwork submitted but hasn't heard anything for disability. Patient states he did receive some documents from [place] and will ask someone to assist him with completing the paperwork. Patient states no other resources are needed at this time.   Patient Self Care Activities & Deficits:  Patient is unable to independently navigate community resource options without care coordination support  Acknowledges deficits and is motivated to resolve concern  Patient will consider an [employer] or the [name] as discussed today Unable to perform IADLs independently Motivation for treatment  Patient Goals/Self-Care Activities: Over the next 120 days Call your insurance provider to discuss transportation options Call your insurance provider for more information about your Enhanced Benefits  Continue with compliance of taking medication        Follow up:  Patient agrees to Care Plan and Follow-up.  Plan: The Managed Medicaid care management team will  reach out to the patient again over the next 30 days.  Date/time of next scheduled Social Work care management/care coordination outreach:  05/03/21  [name], Arita Miss, [place] Medicaid Team  [phone]

## 2021-04-02 NOTE — Patient Instructions (Signed)
Visit Information  James Padilla was given information about Medicaid Managed Care team care coordination services as a part of their Healthy [facility] Medicaid benefit. James Padilla verbally consented to engagement with the [facility] Managed Care team.   If you are experiencing a medical emergency, please call 911 or report to your local emergency department or urgent care.   If you have a non-emergency medical problem during routine business hours, please contact your provider's office and ask to speak with a nurse.   For questions related to your Healthy [facility] health plan, please call: [phone] or visit the homepage here: [email or web address]  If you would like to schedule transportation through your Healthy [facility] plan, please call the following number at least 2 days in advance of your appointment: [phone]  Call the [place] at [phone], at any time, 24 hours a day, 7 days a week. If you are in danger or need immediate medical attention call 911.  If you would like help to quit smoking, call 1-800-QUIT-NOW 920-108-6849) OR Espaol: 1-855-Djelo-Ya QO:409462) o para ms informacin haga clic aqu or Text READY to 200-400 to register via text  Mr. Clemmens - following are the goals we discussed in your visit today:   Goals Addressed   None       Social Worker will follow up with patient in 30 days.   James Padilla, James Padilla, James Padilla  High Risk Managed Medicaid Team  [phone]   Following is a copy of your plan of care:  Patient Care Plan: General Social Work (Adult)     Problem Identified: Issues managing finances, health and mental health care   Priority: High  Note:    Timeframe:  Long-Range Goal Priority:  Medium Start Date:   01/03/21                       Expected End Date: ongoing            Follow Up Date -04/16/21   - begin a notebook of services in  my neighborhood or community - call 211 when I need some help - follow-up on any referrals for help I am given - think ahead to make sure my need does not become an emergency - make a note about what I need to have by the phone or take with me, like an identification card or social security number have a back-up plan    Why is this important?   Knowing how and where to find help for yourself or family in your neighborhood and community is an important skill.  You will want to take some steps to learn how.     CARE PLAN ENTRY (see longitudinal plan of care for additional care plan information)  Current Barriers:  Patient with Depression, Homelessness, Financial Strain and Substance Abuse and is in need of assistance with connection to community resources  Knowledge deficits and need for support, education and care coordination related to community resources support  Limited social support and Housing barriers  Clinical Goal(s)  Over the next 120 days, patient will work with care management team member to address concerns related to community resource needs Clinical Goal(s): Over the next 120 days, patient will work with SW to reduce or manage symptoms of stress and increase knowledge and/or ability of: coping skills, healthy habits, self-management skills, and stress reduction.until connected for ongoing counseling.   Interventions provided by James Padilla:  Assessed patient's care coordination needs related to housing and discussed ongoing care management follow up  Patient has been having ongoing financial issues since he has been out of work since September of 2019.  Patient reports that his brother just paid and got his phone turned back on this morning on 03/26/21 Patient has a history of using cocaine and admits to ongoing usage but denies wanting [facility] James Padilla to make referral for substance abuse treatment at this time. Patient reports that his last usage of cocaine was 01/20/21. He is still adamant  about not receiving mental health or substance abuse treatment at this time but would like to work on his coping skills.  Patient reports receiving $225 in food stamps per month.  Patient reports having difficulty managing his health care expenses, medications and bills. [facility] James Padilla will make referral for [facility] James Padilla on 01/03/21. Patient missed call from [facility] James Padilla and patient was advised to return her call today for community resource support and connection. Patient agreeable to do so.  Patient reports that he is stable where he resides but wishes to find his own residence. He reports that he lives with his friend and his friend's elderly mother. Provided patient with information about the [name] as well as an [place] patient to consider the above housing options and possibility to work as part of the ongoing program requirements  Assessed patient's previous treatment, needs, coping skills, current treatment, support system and barriers to care  Other interventions: Solution-Focused Strategies, Mindfulness or Psychologist, educational, Active listening / Reflection utilized , Emotional Supportive Provided, Motivational Interviewing, Brief CBT , Participation in support group encouraged , and Suicidal Ideation/Homicidal Ideation assessed: ; Past referral made to [facility] James Padilla for housing assistance Patient interviewed and appropriate assessments performed Discussed plans with patient for ongoing care management follow up and provided patient with direct contact information for care management team Assisted patient/caregiver with obtaining information about health plan benefits Provided education and assistance to client regarding Advanced Directives. James Padilla discussed coping skills for anxiety and depression. SW used empathetic and active and reflective listening, validated patient's feelings/concerns, and provided emotional support. James Padilla provided self-care examples to help patient manage their  multiple health conditions and improve his mood. Patient admits that his anger gets the best of him sometimes.  Discussed several options for long term counseling based on need and insurance but patient denies wanting a referral to both substance abuse and mental health treatment again on 03/26/21 Patient also refuses the need for a referral to [place] or Pharmacist at this time.  Inter-disciplinary care team collaboration (see longitudinal plan of care) [facility] James Padilla will update [place] on 01/11/21 01/17/21: James Padilla spoke with patient about his financial strains. Patient states he has already been approved for disability/Ssi but has not started receiving yet due to some additional stipulations. Patient states he is working with a company to assist him with getting his disability but could not remember the name. Patient states he is waiting on a letter to come from them.  Patient does not have to be out of the home he is staying at right now but wants a peace of mind. Patient does not want to go into any type of shelter/substance abuse home. James Padilla will start working on a list of properties in [place] for patient to contact once he starts receiving his disability. 02/06/21: James Padilla followed up with patient about receiving his payments. Patient stated he did receive some paperwork from [place] yesterday and  is going to a friends house tonight to get assistance with completing the paperwork. 03/02/21: James Padilla spoke patient and patient did turn in his disability paperwork, but has not received a decision back yet. Patient states he was changing a tire on Wednesday and his back has been hurting since. James Padilla advised patient to contact his PCP on Monday if his pain gets worse.  04/02/21: James Padilla completed follow up with patient. Patient states he did get his paperwork submitted but hasn't heard anything for disability. Patient states he did receive some documents from [place] and will ask someone to assist him with completing the paperwork.  Patient states no other resources are needed at this time.   Patient Self Care Activities & Deficits:  Patient is unable to independently navigate community resource options without care coordination support  Acknowledges deficits and is motivated to resolve concern  Patient will consider an [place] or the [name] as discussed today Unable to perform IADLs independently Motivation for treatment  Patient Goals/Self-Care Activities: Over the next 120 days Call your insurance provider to discuss transportation options Call your insurance provider for more information about your Enhanced Benefits  Continue with compliance of taking medication

## 2021-04-10 NOTE — Progress Notes (Signed)
Name: James Padilla   MRN: [id number]    DOB: Jan 12, 1966   Date:04/11/2021       Progress Note  Subjective  Chief Complaint  Follow Up  HPI  HTN: taking losartan hctz and Atenolol 25 mg , bp is  now at goal, no chest pain or palpitation   Centribular emphysema: he has a morning cough, that is productive ans has some SOB with activity , he has been a smoker since his late teenage years. He is now on Trelegy   Chronic neck/cervical myelopathy  and back pain: he has a long history of neuropathy, he has tingling on both arms and some myoclonus. He states has difficulty working due to the pain, waiting for disability, lives in friend's house, sleeps on a sofa. He had neck surgery scheduled for Summer 2020 but cancelled once due to COVID-19 and once due to positive drug screen for crack cocaine. He is still doing drugs , he states dismissed from Dr. Cari Caraway office He is taking gabapentin two capsules three times a day, he went up on the dose on his own, explained that he cannot adjust medication without discussing it with me first. Refill sent with new regiment of 600 mg TID   Rash: he states spot on leg is still dry, no pruritus, using topical medication   Atherosclerosis of aorta: on statin therapy, denies side effects, we will recheck labs years   Cocaine drug use: he is still smoke crack about once a week now, trying to wean self off medication   MDD: he takes Depakote but mostly for anger. He does not want to add another medication at this time .   Patient Active Problem List   Diagnosis Date Noted   Centrilobular emphysema ([place]) 10/04/2020   Atherosclerosis of aorta ([place]) 10/04/2020   CAD in native artery 10/04/2020   Neuropathy 08/29/2020   Essential hypertension 01/04/2019   Crack cocaine use 12/17/2018   Sebaceous cyst 12/17/2018   Nonintractable episodic headache 10/05/2018   Frequency of urination and polyuria 10/05/2018   Encounter for tobacco use cessation counseling  10/05/2018   Severe episode of recurrent major depressive disorder, without psychotic features ([place]) 10/05/2018   Irritability and anger 10/05/2018   Homelessness 09/19/2018   Chronic neck pain 09/19/2018   Chronic bilateral back pain 09/19/2018   Right arm pain 08/14/2018    Past Surgical History:  Procedure Laterality Date   COLONOSCOPY WITH PROPOFOL N/A 10/15/2018   Procedure: COLONOSCOPY WITH PROPOFOL;  Surgeon: Jonathon Bellows, MD;  Location: [facility] ENDOSCOPY;  Service: Gastroenterology;  Laterality: N/A;   MULTIPLE TOOTH EXTRACTIONS      Family History  Problem Relation Age of Onset   Breast cancer Mother    Aneurysm Father     Social History   Tobacco Use   Smoking status: Every Day    Packs/day: 0.75    Years: 40.00    Pack years: 30.00    Types: Cigarettes    Start date: 04/12/1982   Smokeless tobacco: Never  Substance Use Topics   Alcohol use: Not Currently    Comment: rarely      Current Outpatient Medications:    aspirin EC 81 MG tablet, Take 1 tablet (81 mg total) by mouth daily., Disp: 30 tablet, Rfl: 0   divalproex (DEPAKOTE) 250 MG DR tablet, Take 1 tablet (250 mg total) by mouth in the morning and at bedtime. For mood, Disp: 180 tablet, Rfl: 1   Fluticasone-Umeclidin-Vilant (TRELEGY ELLIPTA) 100-62.5-25 MCG/INH AEPB,  Inhale 1 puff into the lungs daily., Disp: 3 each, Rfl: 1   gabapentin (NEURONTIN) 300 MG capsule, Take 1 capsule (300 mg total) by mouth 3 (three) times daily., Disp: 270 capsule, Rfl: 1   triamcinolone cream (KENALOG) 0.1 %, Apply 1 application topically 2 (two) times daily., Disp: 30 g, Rfl: 0   atenolol (TENORMIN) 25 MG tablet, Take 1 tablet (25 mg total) by mouth every evening., Disp: 90 tablet, Rfl: 0   atorvastatin (LIPITOR) 20 MG tablet, Take 1 tablet (20 mg total) by mouth daily. For your heart, Disp: 90 tablet, Rfl: 1   losartan-hydrochlorothiazide (HYZAAR) 50-12.5 MG tablet, Take 1 tablet by mouth daily. For blood pressure, Disp: 90  tablet, Rfl: 0  Allergies  Allergen Reactions   Septra [Sulfamethoxazole-Trimethoprim] Other (See Comments)    Renal Failure   Lactose Nausea And Vomiting    I personally reviewed active problem list, medication list, allergies, family history, social history, health maintenance, notes from last encounter with the patient/caregiver today.   ROS  Constitutional: Negative for fever or weight change.  Respiratory: Negative for cough and shortness of breath.   Cardiovascular: Negative for chest pain or palpitations.  Gastrointestinal: Negative for abdominal pain, no bowel changes.  Musculoskeletal: Negative for gait problem or joint swelling.  Skin: positive for rash.  Neurological: Negative for dizziness or headache.  No other specific complaints in a complete review of systems (except as listed in HPI above).   Objective  Vitals:   04/11/21 1135  BP: 138/70  Pulse: 65  Resp: 16  Temp: 97.6 F (36.4 C)  SpO2: 97%  Weight: 122 lb (55.3 kg)  Height: '5\' 5"'$  (1.651 m)    Body mass index is 20.3 kg/m.  Physical Exam  Constitutional: Patient appears well-developed and well-nourished.  No distress.  HEENT: head atraumatic, normocephalic, pupils equal and reactive to light neck supple Cardiovascular: Normal rate, regular rhythm and normal heart sounds.  No murmur heard. No BLE edema. Pulmonary/Chest: Effort normal and breath sounds normal. No respiratory distress. Abdominal: Soft.  There is no tenderness. Psychiatric: Patient has a normal mood and affect. behavior is normal. Judgment and thought content normal.  Skin: flaky skin on left medial thigh   PHQ2/9: Depression screen [facility] 2/9 04/11/2021 03/20/2021 01/11/2021 01/09/2021 12/01/2020  Decreased Interest '1 2 1 2 '$ 0  Down, Depressed, Hopeless '1 2 1 1 2  '$ PHQ - 2 Score '2 4 2 3 2  '$ Altered sleeping '2 3 2 2 1  '$ Tired, decreased energy '2 2 2 2 1  '$ Change in appetite 0 0 2 2 0  Feeling bad or failure about yourself  0 1 0 0 1   Trouble concentrating 0 0 0 0 1  Moving slowly or fidgety/restless 0 0 0 0 1  Suicidal thoughts 0 0 0 0 0  PHQ-9 Score '6 10 8 9 7  '$ Difficult doing work/chores - - Somewhat difficult - -  Some recent data might be hidden    phq 9 is positive   Fall Risk: Fall Risk  04/11/2021 03/20/2021 01/09/2021 11/09/2020 10/10/2020  Falls in the past year? 1 1 0 1 1  Number falls in past yr: 1 1 0 0 -  Injury with Fall? 0 0 0 0 -  Risk for fall due to : History of fall(s) - - - -  Follow up Falls prevention discussed - - - -     Assessment & Plan  1. Centrilobular emphysema ([place])   2. Cervical  myelopathy ([place])   3. Hypertension, benign  - atenolol (TENORMIN) 25 MG tablet; Take 1 tablet (25 mg total) by mouth every evening.  Dispense: 90 tablet; Refill: 0 - losartan-hydrochlorothiazide (HYZAAR) 50-12.5 MG tablet; Take 1 tablet by mouth daily. For blood pressure  Dispense: 90 tablet; Refill: 0  4. Drug abuse, cocaine type ([place])   5. Atherosclerosis of aorta ([place])   6. Need for shingles vaccine  - Varicella-zoster vaccine IM (Shingrix)  7. CAD in native artery   8. Irritability and anger   9. Chronic bilateral back pain, unspecified back location  - gabapentin (NEURONTIN) 300 MG capsule; Take 2 capsules (600 mg total) by mouth 3 (three) times daily.  Dispense: 180 capsule; Refill: 2  10. Chronic neck pain  - gabapentin (NEURONTIN) 300 MG capsule; Take 2 capsules (600 mg total) by mouth 3 (three) times daily.  Dispense: 180 capsule; Refill: 2  11. Essential hypertension   12. Severe episode of recurrent major depressive disorder, without psychotic features ([place])   13. Eczema, unspecified type

## 2021-04-11 ENCOUNTER — Encounter: Payer: Self-pay | Admitting: Family Medicine

## 2021-04-11 ENCOUNTER — Other Ambulatory Visit: Payer: Self-pay

## 2021-04-11 ENCOUNTER — Ambulatory Visit: Payer: Medicaid Other | Admitting: Family Medicine

## 2021-04-11 VITALS — BP 138/70 | HR 65 | Temp 97.6°F | Resp 16 | Ht 65.0 in | Wt 122.0 lb

## 2021-04-11 DIAGNOSIS — F1410 Cocaine abuse, uncomplicated: Secondary | ICD-10-CM

## 2021-04-11 DIAGNOSIS — Z23 Encounter for immunization: Secondary | ICD-10-CM | POA: Diagnosis not present

## 2021-04-11 DIAGNOSIS — M549 Dorsalgia, unspecified: Secondary | ICD-10-CM

## 2021-04-11 DIAGNOSIS — I10 Essential (primary) hypertension: Secondary | ICD-10-CM

## 2021-04-11 DIAGNOSIS — R454 Irritability and anger: Secondary | ICD-10-CM

## 2021-04-11 DIAGNOSIS — I2510 Atherosclerotic heart disease of native coronary artery without angina pectoris: Secondary | ICD-10-CM

## 2021-04-11 DIAGNOSIS — L309 Dermatitis, unspecified: Secondary | ICD-10-CM

## 2021-04-11 DIAGNOSIS — I700 Atherosclerosis of aorta: Secondary | ICD-10-CM

## 2021-04-11 DIAGNOSIS — F332 Major depressive disorder, recurrent severe without psychotic features: Secondary | ICD-10-CM

## 2021-04-11 DIAGNOSIS — J432 Centrilobular emphysema: Secondary | ICD-10-CM | POA: Diagnosis not present

## 2021-04-11 DIAGNOSIS — G959 Disease of spinal cord, unspecified: Secondary | ICD-10-CM

## 2021-04-11 DIAGNOSIS — M542 Cervicalgia: Secondary | ICD-10-CM

## 2021-04-11 DIAGNOSIS — G8929 Other chronic pain: Secondary | ICD-10-CM

## 2021-04-11 MED ORDER — GABAPENTIN 300 MG PO CAPS: 600.0000 mg | ORAL_CAPSULE | Freq: Three times a day (TID) | ORAL | 2 refills | Status: DC

## 2021-04-11 MED ORDER — ATENOLOL 25 MG PO TABS: 25.0000 mg | ORAL_TABLET | Freq: Every evening | ORAL | 0 refills | Status: DC

## 2021-04-11 MED ORDER — LOSARTAN POTASSIUM-HCTZ 50-12.5 MG PO TABS
1.0000 | ORAL_TABLET | Freq: Every day | ORAL | 0 refills | Status: DC
Start: 2021-04-11 — End: 2021-04-23

## 2021-04-11 MED ORDER — ATORVASTATIN CALCIUM 20 MG PO TABS: 20.0000 mg | ORAL_TABLET | Freq: Every day | ORAL | 1 refills | Status: DC

## 2021-04-16 ENCOUNTER — Other Ambulatory Visit: Payer: Self-pay | Admitting: Licensed Clinical Social Worker

## 2021-04-16 NOTE — Patient Outreach (Signed)
Medicaid Managed Care Social Work Note  04/16/2021 Name:  BLADE OKULA MRN:  [id number] DOB:  08/20/1966  HALFORD SPAYD is an 55 y.o. year old male who is a primary patient of Steele Sizer, MD.  The Medicaid Managed Care Coordination team was consulted for assistance with:  [place] and Resources  Mr. Oakman was given information about Medicaid Managed Care Coordination team services today. [name] Patient agreed to services and verbal consent obtained.  Engaged with patient  for by telephone forfollow up visit in response to referral for case management and/or care coordination services.   Assessments/Interventions:  Review of past medical history, allergies, medications, health status, including review of consultants reports, laboratory and other test data, was performed as part of comprehensive evaluation and provision of chronic care management services.  SDOH: (Social Determinant of Health) assessments and interventions performed: SDOH Interventions    Flowsheet Row Most Recent Value  SDOH Interventions   Stress Interventions Provide Counseling  Depression Interventions/Treatment  Patient refuses Treatment       Advanced Directives Status:  See Care Plan for related entries.  Care Plan                 Allergies  Allergen Reactions   Septra [Sulfamethoxazole-Trimethoprim] Other (See Comments)    Renal Failure   Lactose Nausea And Vomiting    Medications Reviewed Today     Reviewed by Steele Sizer, MD (Physician) on 04/11/21 at 1151  Med List Status: <None>   Medication Order Taking? Sig Documenting Provider Last Dose Status Informant  aspirin EC 81 MG tablet [id number] Yes Take 1 tablet (81 mg total) by mouth daily. Steele Sizer, MD Taking Active   atenolol (TENORMIN) 25 MG tablet [id number] Yes Take 1 tablet (25 mg total) by mouth every evening. Steele Sizer, MD Taking Active   atorvastatin (LIPITOR) 20 MG tablet [id number] Yes Take 1 tablet  (20 mg total) by mouth daily. For your heart Steele Sizer, MD Taking Active   divalproex (DEPAKOTE) 250 MG DR tablet [id number] Yes Take 1 tablet (250 mg total) by mouth in the morning and at bedtime. For mood Steele Sizer, MD Taking Active   Fluticasone-Umeclidin-Vilant (TRELEGY ELLIPTA) 100-62.5-25 MCG/INH AEPB [id number] Yes Inhale 1 puff into the lungs daily. Steele Sizer, MD Taking Active   gabapentin (NEURONTIN) 300 MG capsule [id number] Yes Take 1 capsule (300 mg total) by mouth 3 (three) times daily. Steele Sizer, MD Taking Active   losartan-hydrochlorothiazide [facility]) 50-12.5 MG tablet [id number] Yes Take 1 tablet by mouth daily. For blood pressure Steele Sizer, MD Taking Active   Discontinued 04/11/21 1151 (Completed Course)   tiZANidine (ZANAFLEX) 2 MG tablet [id number] Yes Take 1 tablet (2 mg total) by mouth every 8 (eight) hours as needed for muscle spasms. Steele Sizer, MD Taking Active   triamcinolone cream (KENALOG) 0.1 % [id number] Yes Apply 1 application topically 2 (two) times daily. Steele Sizer, MD Taking Active             Patient Active Problem List   Diagnosis Date Noted   Centrilobular emphysema ([place]) 10/04/2020   Atherosclerosis of aorta ([place]) 10/04/2020   CAD in native artery 10/04/2020   Neuropathy 08/29/2020   Essential hypertension 01/04/2019   Crack cocaine use 12/17/2018   Sebaceous cyst 12/17/2018   Nonintractable episodic headache 10/05/2018   Frequency of urination and polyuria 10/05/2018   Encounter for tobacco use cessation counseling 10/05/2018   Severe episode of recurrent major depressive disorder, without  psychotic features ([place]) 10/05/2018   Irritability and anger 10/05/2018   Homelessness 09/19/2018   Chronic neck pain 09/19/2018   Chronic bilateral back pain 09/19/2018   Right arm pain 08/14/2018    Conditions to be addressed/monitored per PCP order:  Anxiety  Care Plan : General Social Work (Adult)  Updates made by  Greg Cutter, LCSW since 04/16/2021 12:00 AM     Problem: Issues managing finances, health and mental health care   Priority: High  Note:    Timeframe:  Long-Range Goal Priority:  Medium Start Date:   01/03/21                       Expected End Date: ongoing            Follow Up [facility] with [facility] LCSW: as needed   - begin a notebook of services in my neighborhood or community - call 211 when I need some help - follow-up on any referrals for help I am given - think ahead to make sure my need does not become an emergency - make a note about what I need to have by the phone or take with me, like an identification card or social security number have a back-up plan    Why is this important?   Knowing how and where to find help for yourself or family in your neighborhood and community is an important skill.  You will want to take some steps to learn how.     CARE PLAN ENTRY (see longitudinal plan of care for additional care plan information)  Current Barriers:  Patient with Depression, Homelessness, Financial Strain and Substance Abuse and is in need of assistance with connection to community resources  Knowledge deficits and need for support, education and care coordination related to community resources support  Limited social support and Housing barriers  Clinical Goal(s)  Over the next 120 days, patient will work with care management team member to address concerns related to community resource needs Clinical Goal(s): Over the next 120 days, patient will work with SW to reduce or manage symptoms of stress and increase knowledge and/or ability of: coping skills, healthy habits, self-management skills, and stress reduction.until connected for ongoing counseling.   Interventions provided by LCSW:  Assessed patient's care coordination needs related to housing and discussed ongoing care management follow up  Patient has been having ongoing financial issues since he has been out of work  since September of 2019.  Patient reports that his brother just paid and got his phone turned back on this morning on 03/26/21 Patient has a history of using cocaine and admits to ongoing usage but denies wanting [facility] LCSW to make referral for substance abuse treatment at this time. Patient reports that his last usage of cocaine was 01/20/21. He is still adamant about not receiving mental health or substance abuse treatment at this time but would like to work on his coping skills. Patient refused these resources/referrals again on 04/16/21. [place] LCSW will update [place] BSW. Patient reports receiving $225 in food stamps per month.  Patient reports having difficulty managing his health care expenses, medications and bills. [facility] LCSW will make referral for [facility] BSW on 01/03/21. Patient missed call from [facility] BSW and patient was advised to return her call today for community resource support and connection. Patient agreeable to do so.  Patient reports that he is stable where he resides but wishes to find his own residence. He reports that he  lives with his friend and his friend's elderly mother. Provided patient with information about the [name] as well as an [place] patient to consider the above housing options and possibility to work as part of the ongoing program requirements  Assessed patient's previous treatment, needs, coping skills, current treatment, support system and barriers to care  Other interventions: Solution-Focused Strategies, Mindfulness or Psychologist, educational, Active listening / Reflection utilized , Emotional Supportive Provided, Motivational Interviewing, Brief CBT , Participation in support group encouraged , and Suicidal Ideation/Homicidal Ideation assessed: ; Past referral made to [facility] BSW for housing assistance Patient interviewed and appropriate assessments performed Discussed plans with patient for ongoing care management follow up and provided patient with direct  contact information for care management team Assisted patient/caregiver with obtaining information about health plan benefits Provided education and assistance to client regarding Advanced Directives. LCSW discussed coping skills for anxiety and depression. SW used empathetic and active and reflective listening, validated patient's feelings/concerns, and provided emotional support. LCSW provided self-care examples to help patient manage their multiple health conditions and improve his mood. Patient admits that his anger gets the best of him sometimes.  Discussed several options for long term counseling based on need and insurance but patient denies wanting a referral to both substance abuse and mental health treatment again on 03/26/21 Patient also refuses the need for a referral to [place] or Pharmacist at this time.  Inter-disciplinary care team collaboration (see longitudinal plan of care) [facility] LCSW will update [place] on 01/11/21 01/17/21: BSW spoke with patient about his financial strains. Patient states he has already been approved for disability/Ssi but has not started receiving yet due to some additional stipulations. Patient states he is working with a company to assist him with getting his disability but could not remember the name. Patient states he is waiting on a letter to come from them.  Patient does not have to be out of the home he is staying at right now but wants a peace of mind. Patient does not want to go into any type of shelter/substance abuse home. BSW will start working on a list of properties in [place] for patient to contact once he starts receiving his disability. 02/06/21: BSW followed up with patient about receiving his payments. Patient stated he did receive some paperwork from [place] yesterday and is going to a friends house tonight to get assistance with completing the paperwork. 03/02/21: BSW spoke patient and patient did turn in his disability paperwork, but has not  received a decision back yet. Patient states he was changing a tire on Wednesday and his back has been hurting since. BSW advised patient to contact his PCP on Monday if his pain gets worse.  04/02/21: BSW completed follow up with patient. Patient states he did get his paperwork submitted but hasn't heard anything for disability. Patient states he did receive some documents from [place] and will ask someone to assist him with completing the paperwork. Patient states no other resources are needed at this time.   Patient Self Care Activities & Deficits:  Patient is unable to independently navigate community resource options without care coordination support  Acknowledges deficits and is motivated to resolve concern  Patient will consider an [place] or the [name] as discussed today Unable to perform IADLs independently Motivation for treatment  Patient Goals/Self-Care Activities: Over the next 120 days Call your insurance provider to discuss transportation options Call your insurance provider for more information about  your Enhanced Benefits  Continue with compliance of taking medication        Follow up:  Patient requests no follow-up at this time.  Plan: The Managed Medicaid care management team will reach out to the patient again over the next 30 days.  Eula Fried, BSW, MSW, [employer] Managed Medicaid LCSW [email or web address] Phone: [phone]

## 2021-04-16 NOTE — Patient Instructions (Signed)
Visit Information  Mr. Baes was given information about Medicaid Managed Care team care coordination services as a part of their Healthy [facility] Medicaid benefit. [name] verbally consented to engagement with the [facility] Managed Care team.   If you are experiencing a medical emergency, please call 911 or report to your local emergency department or urgent care.   If you have a non-emergency medical problem during routine business hours, please contact your provider's office and ask to speak with a nurse.   For questions related to your Healthy [facility] health plan, please call: [phone] or visit the homepage here: [email or web address]  If you would like to schedule transportation through your Healthy [facility] plan, please call the following number at least 2 days in advance of your appointment: [phone]  Call the [place] at [phone], at any time, 24 hours a day, 7 days a week. If you are in danger or need immediate medical attention call 911.  If you would like help to quit smoking, call 1-800-QUIT-NOW (317)015-6668) OR Espaol: 1-855-Djelo-Ya QO:409462) o para ms informacin haga clic aqu or Text READY to 200-400 to register via text  James Padilla - following are the goals we discussed in your visit today:   Goals Addressed             This Visit's Progress    Find Help in My Community        Timeframe:  Long-Range Goal Priority:  Medium Start Date:   01/03/21                       Expected End Date: 04/16/21                 Follow Up Date -as needed.   - begin a notebook of services in my neighborhood or community - call 211 when I need some help - follow-up on any referrals for help I am given - think ahead to make sure my need does not become an emergency - make a note about what I need to have by the phone or take with me, like an identification card or social security number have a  back-up plan    Why is this important?   Knowing how and where to find help for yourself or family in your neighborhood and community is an important skill.  You will want to take some steps to learn how.    Notes:         Eula Fried, BSW, MSW, [employer] Managed Medicaid LCSW [email or web address] Phone: [phone]

## 2021-04-23 ENCOUNTER — Other Ambulatory Visit: Payer: Self-pay

## 2021-04-23 DIAGNOSIS — R454 Irritability and anger: Secondary | ICD-10-CM

## 2021-04-23 DIAGNOSIS — G8929 Other chronic pain: Secondary | ICD-10-CM

## 2021-04-23 DIAGNOSIS — I10 Essential (primary) hypertension: Secondary | ICD-10-CM

## 2021-04-23 DIAGNOSIS — L309 Dermatitis, unspecified: Secondary | ICD-10-CM

## 2021-04-23 DIAGNOSIS — F330 Major depressive disorder, recurrent, mild: Secondary | ICD-10-CM

## 2021-04-23 DIAGNOSIS — J432 Centrilobular emphysema: Secondary | ICD-10-CM

## 2021-04-24 MED ORDER — LOSARTAN POTASSIUM-HCTZ 50-12.5 MG PO TABS: 1.0000 | ORAL_TABLET | Freq: Every day | ORAL | 0 refills | Status: DC

## 2021-04-24 MED ORDER — DIVALPROEX SODIUM 250 MG PO DR TAB: 250.0000 mg | DELAYED_RELEASE_TABLET | Freq: Two times a day (BID) | ORAL | 0 refills | Status: DC

## 2021-04-24 MED ORDER — GABAPENTIN 300 MG PO CAPS: 600.0000 mg | ORAL_CAPSULE | Freq: Three times a day (TID) | ORAL | 0 refills | Status: DC

## 2021-04-24 MED ORDER — ATENOLOL 25 MG PO TABS: 25.0000 mg | ORAL_TABLET | Freq: Every evening | ORAL | 0 refills | Status: DC

## 2021-04-24 MED ORDER — TRIAMCINOLONE ACETONIDE 0.1 % EX CREA: 1.0000 "application " | TOPICAL_CREAM | Freq: Two times a day (BID) | CUTANEOUS | 0 refills | Status: AC

## 2021-04-24 MED ORDER — ATORVASTATIN CALCIUM 20 MG PO TABS: 20.0000 mg | ORAL_TABLET | Freq: Every day | ORAL | 0 refills | Status: DC

## 2021-05-01 ENCOUNTER — Other Ambulatory Visit: Payer: Self-pay

## 2021-05-01 DIAGNOSIS — G8929 Other chronic pain: Secondary | ICD-10-CM

## 2021-05-01 DIAGNOSIS — M542 Cervicalgia: Secondary | ICD-10-CM

## 2021-05-01 DIAGNOSIS — I10 Essential (primary) hypertension: Secondary | ICD-10-CM

## 2021-05-01 DIAGNOSIS — J432 Centrilobular emphysema: Secondary | ICD-10-CM

## 2021-05-03 ENCOUNTER — Other Ambulatory Visit: Payer: Self-pay

## 2021-05-03 NOTE — Patient Outreach (Signed)
Care Coordination  05/03/2021  James Padilla 1965/11/05 [id number]   Medicaid Managed Care   Unsuccessful Outreach Note  05/03/2021 Name: James Padilla MRN: [id number] DOB: [birth year]  Referred by: Steele Sizer, MD Reason for referral : High Risk Managed Medicaid (MM Social work [employer])   An unsuccessful telephone outreach was attempted today. The patient was referred to the case management team for assistance with care management and care coordination.   Follow Up Plan: The care management team will reach out to the patient again over the next 14 days.   [name], BSW, [place] Managed Medicaid Team  [phone]

## 2021-05-03 NOTE — Patient Instructions (Signed)
Visit Information  James Padilla  - as a part of your Medicaid benefit, you are eligible for care management and care coordination services at no cost or copay. I was unable to reach you by phone today but would be happy to help you with your health related needs. Please feel free to call me @ [phone].   A member of the Managed Medicaid care management team will reach out to you again over the next 14 days.   [name], BSW, [place] Managed Medicaid Team  [phone]

## 2021-05-23 ENCOUNTER — Other Ambulatory Visit: Payer: Self-pay

## 2021-05-23 NOTE — Patient Outreach (Signed)
Care Coordination  05/23/2021  James Padilla 1966-01-28 [id number]   Medicaid Managed Care   Unsuccessful Outreach Note  05/23/2021 Name: James Padilla MRN: [id number] DOB: [birth year]  Referred by: Steele Sizer, MD Reason for referral : High Risk Managed Medicaid (MM Unsuccessful Telephone Outreach)   An unsuccessful telephone outreach was attempted today. The patient was referred to the case management team for assistance with care management and care coordination.   Follow Up Plan: The care management team will reach out to the patient again over the next 30 days.   [name], BSW, [place] Managed Medicaid Team  [phone]

## 2021-05-23 NOTE — Patient Instructions (Signed)
Visit Information  Mr. DAMIEN BATTY  - as a part of your Medicaid benefit, you are eligible for care management and care coordination services at no cost or copay. I was unable to reach you by phone today but would be happy to help you with your health related needs. Please feel free to call me @ [phone].   A member of the Managed Medicaid care management team will reach out to you again over the next 30 days.   [name], BSW, [place] Managed Medicaid Team  [phone]

## 2021-07-03 ENCOUNTER — Ambulatory Visit: Payer: Self-pay | Admitting: *Deleted

## 2021-07-03 NOTE — Telephone Encounter (Signed)
Pr agent: "Pt called stating that he has had on and off chest pain x1 week that also shoot up the side of his neck. Pt was on hold to speak with a nurse and disconnected call. Attempted to call pt back, but number states it is disconnected. Please advise."  Was able to reach pt, at number provided by brother (on [place]) pt's phone kept disconnecting, x 2. Will attempt to reach again.

## 2021-07-03 NOTE — Telephone Encounter (Signed)
Patient called back. C/o chest pain comes and goes and worsening. Sharp sudden pains left side of chest, radiates to neck and causes episodes of incontinence . Feels twitches in chest. If moving a "wrong way" chest pain causes to stop and lasts approx. 5 minutes.  Difficulty breathing reported at times. Denies now. Instructed patient to go to ED now and call 911 if symptoms worsen. Called office to inform Melissa, NT recommended patient to go to ED for evaluation. Reason for Disposition . [1] Chest pain (or "angina") comes and goes AND [2] is happening more often (increasing in frequency) or getting worse (increasing in severity) (Exception: chest pains that last only a few seconds)  Answer Assessment - Initial Assessment Questions 1. LOCATION: "Where does it hurt?"       Left chest  2. RADIATION: "Does the pain go anywhere else?" (e.g., into neck, jaw, arms, back)     Into neck left side  3. ONSET: "When did the chest pain begin?" (Minutes, hours or days)      Last Thursday 4. PATTERN "Does the pain come and go, or has it been constant since it started?"  "Does it get worse with exertion?"      Comes and goes but happening more often 5. DURATION: "How long does it last" (e.g., seconds, minutes, hours)     5 minutes  6. SEVERITY: "How bad is the pain?"  (e.g., Scale 1-10; mild, moderate, or severe)    - MILD (1-3): doesn't interfere with normal activities     - MODERATE (4-7): interferes with normal activities or awakens from sleep    - SEVERE (8-10): excruciating pain, unable to do any normal activities       Sudden and causes "to scream". Becomes incontinent 7. CARDIAC RISK FACTORS: "Do you have any history of heart problems or risk factors for heart disease?" (e.g., angina, prior heart attack; diabetes, high blood pressure, high cholesterol, smoker, or strong family history of heart disease)     HTN 8. PULMONARY RISK FACTORS: "Do you have any history of lung disease?"  (e.g., blood clots in  lung, asthma, emphysema, birth control pills)     na 9. CAUSE: "What do you think is causing the chest pain?"     Does not know 10. OTHER SYMPTOMS: "Do you have any other symptoms?" (e.g., dizziness, nausea, vomiting, sweating, fever, difficulty breathing, cough)       Difficulty breathing at times  11. PREGNANCY: "Is there any chance you are pregnant?" "When was your last menstrual period?"       na  Protocols used: Chest Pain-A-AH

## 2021-07-12 NOTE — Progress Notes (Signed)
Name: James Padilla   MRN: [id number]    DOB: 1966-06-27   Date:07/13/2021       Progress Note  Subjective  Chief Complaint  Follow Up  HPI  HTN: taking losartan hctz and Atenolol 25 mg , bp is elevated today, no chest pain or palpitation . Explained importance of taking medication daily. He states he is out of medications  Centribular emphysema: he has a morning cough, that is productive ans has some SOB with activity , he has been a smoker since his late teenage years. He states he is out of medication, sending another rx to pharmacy    Chronic neck/cervical myelopathy  and back pain: he has a long history of neuropathy, he has tingling on both arms and some myoclonus. He states has difficulty working due to the pain, waiting for disability, lives in friend's house, sleeps on a sofa. He had neck surgery scheduled for Summer 2020 but cancelled once due to COVID-19 and once due to positive drug screen for crack cocaine and was dismissed from Dr. Cari Caraway. He is still doing drugs , he quit from Nov 2 nd until 07/31/23 th but had to go to two funerals this past Saturday and resumed drugs again. He is taking gabapentin two capsules three times a day but still in constant pain and at times sharp pain that radiates from his neck to his sacral area. He is on disability but has to go back for a hearing in Dec   Atherosclerosis of aorta: on statin therapy, denies side effects, Last LDL was at goal at 31   Cocaine drug use: he quit smoking for about 9 days but had a relapse on July 31, 2023 th due to the death of two of his high school friends, he is trying to wean self off again.   MDD: he takes Depakote but mostly for anger. He does not want to add another medication at this time . He is grieving the loss of two friends at this time   Patient Active Problem List   Diagnosis Date Noted   Centrilobular emphysema ([place]) 10/04/2020   Atherosclerosis of aorta ([place]) 10/04/2020   CAD in native artery 10/04/2020    Neuropathy 08/29/2020   Essential hypertension 01/04/2019   Crack cocaine use 12/17/2018   Sebaceous cyst 12/17/2018   Nonintractable episodic headache 10/05/2018   Frequency of urination and polyuria 10/05/2018   Irritability and anger 10/05/2018   Homelessness 09/19/2018   Chronic neck pain 09/19/2018   Chronic bilateral back pain 09/19/2018   Right arm pain 08/14/2018    Past Surgical History:  Procedure Laterality Date   COLONOSCOPY WITH PROPOFOL N/A 10/15/2018   Procedure: COLONOSCOPY WITH PROPOFOL;  Surgeon: Jonathon Bellows, MD;  Location: [facility] ENDOSCOPY;  Service: Gastroenterology;  Laterality: N/A;   MULTIPLE TOOTH EXTRACTIONS      Family History  Problem Relation Age of Onset   Breast cancer Mother    Aneurysm Father     Social History   Tobacco Use   Smoking status: Every Day    Packs/day: 0.75    Years: 40.00    Pack years: 30.00    Types: Cigarettes    Start date: 04/12/1982   Smokeless tobacco: Never  Substance Use Topics   Alcohol use: Not Currently    Comment: rarely      Current Outpatient Medications:    aspirin EC 81 MG tablet, Take 1 tablet (81 mg total) by mouth daily., Disp: 30 tablet, Rfl: 0  Fluticasone-Umeclidin-Vilant (TRELEGY ELLIPTA) 100-62.5-25 MCG/ACT AEPB, Inhale 1 puff into the lungs daily., Disp: 3 each, Rfl: 1   triamcinolone cream (KENALOG) 0.1 %, Apply 1 application topically 2 (two) times daily., Disp: 30 g, Rfl: 0   atenolol (TENORMIN) 25 MG tablet, Take 1 tablet (25 mg total) by mouth every evening., Disp: 90 tablet, Rfl: 1   atorvastatin (LIPITOR) 20 MG tablet, Take 1 tablet (20 mg total) by mouth daily. For your heart, Disp: 90 tablet, Rfl: 1   divalproex (DEPAKOTE) 250 MG DR tablet, Take 1 tablet (250 mg total) by mouth in the morning and at bedtime. For mood, Disp: 180 tablet, Rfl: 1   gabapentin (NEURONTIN) 300 MG capsule, Take 2 capsules (600 mg total) by mouth 3 (three) times daily., Disp: 180 capsule, Rfl: 1    losartan-hydrochlorothiazide (HYZAAR) 50-12.5 MG tablet, Take 1 tablet by mouth daily. For blood pressure, Disp: 90 tablet, Rfl: 1  Allergies  Allergen Reactions   Septra [Sulfamethoxazole-Trimethoprim] Other (See Comments)    Renal Failure   Lactose Nausea And Vomiting    I personally reviewed active problem list, medication list, allergies, family history, social history, health maintenance with the patient/caregiver today.   ROS  Constitutional: Negative for fever or weight change.  Respiratory: Positive  for cough and shortness of breath.   Cardiovascular: Negative for chest pain or palpitations.  Gastrointestinal: Negative for abdominal pain, no bowel changes.  Musculoskeletal: Positive for gait problem but no  joint swelling.  Skin: Negative for rash.  Neurological: Negative for dizziness or headache.  No other specific complaints in a complete review of systems (except as listed in HPI above).   Objective  Vitals:   07/13/21 1018  BP: (!) 158/84  Pulse: 87  Resp: 16  Temp: 97.8 F (36.6 C)  TempSrc: Oral  SpO2: 98%  Weight: 122 lb (55.3 kg)  Height: 5\' 5"  (1.651 m)    Body mass index is 20.3 kg/m.  Physical Exam  Constitutional: Patient appears well-developed he is very thin.  No distress.  HEENT: head atraumatic, normocephalic, pupils equal and reactive to light, neck supple Cardiovascular: Normal rate, regular rhythm and normal heart sounds.  No murmur heard. No BLE edema. Pulmonary/Chest: Effort normal and breath sounds normal. No respiratory distress. Abdominal: Soft.  There is no tenderness. Muscular skeletal: shifting positions during his visit, grimacing with pain intermittently  Psychiatric: Patient has a normal mood and affect. behavior is normal. Judgment and thought content normal.   PHQ2/9: Depression screen [facility] 2/9 07/13/2021 04/11/2021 03/20/2021 01/11/2021 01/09/2021  Decreased Interest 0 1 2 1 2   Down, Depressed, Hopeless 0 1 2 1 1   PHQ - 2  Score 0 2 4 2 3   Altered sleeping 0 2 3 2 2   Tired, decreased energy 0 2 2 2 2   Change in appetite 0 0 0 2 2  Feeling bad or failure about yourself  0 0 1 0 0  Trouble concentrating 0 0 0 0 0  Moving slowly or fidgety/restless 0 0 0 0 0  Suicidal thoughts 0 0 0 0 0  PHQ-9 Score 0 6 10 8 9   Difficult doing work/chores Not difficult at all - - Somewhat difficult -  Some recent data might be hidden    phq 9 is negative   Fall Risk: Fall Risk  07/13/2021 04/11/2021 03/20/2021 01/09/2021 11/09/2020  Falls in the past year? 1 1 1  0 1  Number falls in past yr: 0 1 1 0 0  Injury  with Fall? 0 0 0 0 0  Risk for fall due to : Impaired balance/gait History of fall(s) - - -  Follow up Falls prevention discussed Falls prevention discussed - - -      Functional Status Survey: Is the patient deaf or have difficulty hearing?: Yes Does the patient have difficulty seeing, even when wearing glasses/contacts?: Yes Does the patient have difficulty concentrating, remembering, or making decisions?: No Does the patient have difficulty walking or climbing stairs?: No Does the patient have difficulty dressing or bathing?: No Does the patient have difficulty doing errands alone such as visiting a doctor's office or shopping?: No    Assessment & Plan  1. Centrilobular emphysema ([place])  Resume Trelegy   2. Cervical myelopathy ([place])  Dismissed from neurosurgeon   3. Atherosclerosis of aorta ([facility])  Continue statin therapy   4. Recurrent major depressive disorder, remission status unspecified ([place])   5. Hypertension, benign  - atenolol (TENORMIN) 25 MG tablet; Take 1 tablet (25 mg total) by mouth every evening.  Dispense: 90 tablet; Refill: 1 - losartan-hydrochlorothiazide (HYZAAR) 50-12.5 MG tablet; Take 1 tablet by mouth daily. For blood pressure  Dispense: 90 tablet; Refill: 1  6. 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[facility])  - divalproex (DEPAKOTE) 250 MG DR tablet; Take 1  tablet (250 mg total) by mouth in the morning and at bedtime. For mood  Dispense: 180 tablet; Refill: 1  7. Drug abuse, cocaine type ([place])  Trying to quit   8. Chronic bilateral back pain, unspecified back location  - gabapentin (NEURONTIN) 300 MG capsule; Take 2 capsules (600 mg total) by mouth 3 (three) times daily.  Dispense: 180 capsule; Refill: 1  9. CAD in native artery   10. Chronic neck pain  - gabapentin (NEURONTIN) 300 MG capsule; Take 2 capsules (600 mg total) by mouth 3 (three) times daily.  Dispense: 180 capsule; Refill: 1  11. Irritability and anger  - divalproex (DEPAKOTE) 250 MG DR tablet; Take 1 tablet (250 mg total) by mouth in the morning and at bedtime. For mood  Dispense: 180 tablet; Refill: 1

## 2021-07-13 ENCOUNTER — Ambulatory Visit: Payer: Medicaid Other | Admitting: Family Medicine

## 2021-07-13 ENCOUNTER — Other Ambulatory Visit: Payer: Self-pay

## 2021-07-13 ENCOUNTER — Encounter: Payer: Self-pay | Admitting: Family Medicine

## 2021-07-13 VITALS — BP 152/86 | HR 87 | Temp 97.8°F | Resp 16 | Ht 65.0 in | Wt 122.0 lb

## 2021-07-13 DIAGNOSIS — I700 Atherosclerosis of aorta: Secondary | ICD-10-CM

## 2021-07-13 DIAGNOSIS — G959 Disease of spinal cord, unspecified: Secondary | ICD-10-CM | POA: Diagnosis not present

## 2021-07-13 DIAGNOSIS — I2510 Atherosclerotic heart disease of native coronary artery without angina pectoris: Secondary | ICD-10-CM

## 2021-07-13 DIAGNOSIS — F1410 Cocaine abuse, uncomplicated: Secondary | ICD-10-CM

## 2021-07-13 DIAGNOSIS — F339 Major depressive disorder, recurrent, unspecified: Secondary | ICD-10-CM

## 2021-07-13 DIAGNOSIS — F330 Major depressive disorder, recurrent, mild: Secondary | ICD-10-CM

## 2021-07-13 DIAGNOSIS — J432 Centrilobular emphysema: Secondary | ICD-10-CM | POA: Diagnosis not present

## 2021-07-13 DIAGNOSIS — M549 Dorsalgia, unspecified: Secondary | ICD-10-CM

## 2021-07-13 DIAGNOSIS — M542 Cervicalgia: Secondary | ICD-10-CM

## 2021-07-13 DIAGNOSIS — G8929 Other chronic pain: Secondary | ICD-10-CM

## 2021-07-13 DIAGNOSIS — R454 Irritability and anger: Secondary | ICD-10-CM

## 2021-07-13 DIAGNOSIS — I10 Essential (primary) hypertension: Secondary | ICD-10-CM

## 2021-07-13 MED ORDER — ATENOLOL 25 MG PO TABS: 25.0000 mg | ORAL_TABLET | Freq: Every evening | ORAL | 1 refills | Status: DC

## 2021-07-13 MED ORDER — DIVALPROEX SODIUM 250 MG PO DR TAB: 250.0000 mg | DELAYED_RELEASE_TABLET | Freq: Two times a day (BID) | ORAL | 1 refills | Status: DC

## 2021-07-13 MED ORDER — TRELEGY ELLIPTA 100-62.5-25 MCG/ACT IN AEPB: 1.0000 | INHALATION_SPRAY | Freq: Every day | RESPIRATORY_TRACT | 1 refills | Status: DC

## 2021-07-13 MED ORDER — LOSARTAN POTASSIUM-HCTZ 50-12.5 MG PO TABS
1.0000 | ORAL_TABLET | Freq: Every day | ORAL | 1 refills | Status: DC
Start: 2021-07-13 — End: 2022-01-10

## 2021-07-13 MED ORDER — GABAPENTIN 300 MG PO CAPS: 600.0000 mg | ORAL_CAPSULE | Freq: Three times a day (TID) | ORAL | 1 refills | Status: DC

## 2021-07-13 MED ORDER — ATORVASTATIN CALCIUM 20 MG PO TABS: 20.0000 mg | ORAL_TABLET | Freq: Every day | ORAL | 1 refills | Status: DC

## 2021-08-17 ENCOUNTER — Other Ambulatory Visit: Payer: Self-pay | Admitting: Family Medicine

## 2021-08-17 DIAGNOSIS — M5442 Lumbago with sciatica, left side: Secondary | ICD-10-CM

## 2021-08-29 ENCOUNTER — Other Ambulatory Visit: Payer: Self-pay | Admitting: Family Medicine

## 2021-08-29 DIAGNOSIS — M5442 Lumbago with sciatica, left side: Secondary | ICD-10-CM

## 2021-09-01 ENCOUNTER — Emergency Department: Payer: Medicaid Other

## 2021-09-01 ENCOUNTER — Other Ambulatory Visit: Payer: Self-pay

## 2021-09-01 ENCOUNTER — Emergency Department
Admission: EM | Admit: 2021-09-01 | Discharge: 2021-09-02 | Disposition: A | Discharge status: Home / Self Care | Payer: Medicaid Other | Attending: Emergency Medicine | Admitting: Emergency Medicine

## 2021-09-01 DIAGNOSIS — I10 Essential (primary) hypertension: Secondary | ICD-10-CM | POA: Insufficient documentation

## 2021-09-01 DIAGNOSIS — M542 Cervicalgia: Secondary | ICD-10-CM | POA: Diagnosis not present

## 2021-09-01 DIAGNOSIS — R112 Nausea with vomiting, unspecified: Secondary | ICD-10-CM | POA: Insufficient documentation

## 2021-09-01 DIAGNOSIS — Y900 Blood alcohol level of less than 20 mg/100 ml: Secondary | ICD-10-CM | POA: Diagnosis not present

## 2021-09-01 DIAGNOSIS — R079 Chest pain, unspecified: Secondary | ICD-10-CM | POA: Diagnosis not present

## 2021-09-01 DIAGNOSIS — G8929 Other chronic pain: Secondary | ICD-10-CM | POA: Diagnosis not present

## 2021-09-01 DIAGNOSIS — R1111 Vomiting without nausea: Secondary | ICD-10-CM | POA: Diagnosis not present

## 2021-09-01 DIAGNOSIS — R001 Bradycardia, unspecified: Secondary | ICD-10-CM | POA: Diagnosis not present

## 2021-09-01 DIAGNOSIS — R531 Weakness: Secondary | ICD-10-CM | POA: Insufficient documentation

## 2021-09-01 DIAGNOSIS — R0689 Other abnormalities of breathing: Secondary | ICD-10-CM | POA: Diagnosis not present

## 2021-09-01 DIAGNOSIS — R200 Anesthesia of skin: Secondary | ICD-10-CM | POA: Diagnosis not present

## 2021-09-01 DIAGNOSIS — R4182 Altered mental status, unspecified: Secondary | ICD-10-CM | POA: Diagnosis not present

## 2021-09-01 LAB — COMPREHENSIVE METABOLIC PANEL
ALT: 25 U/L (ref 0–44)
AST: 34 U/L (ref 15–41)
Albumin: 4 g/dL (ref 3.5–5.0)
Alkaline Phosphatase: 67 U/L (ref 38–126)
Anion gap: 8 (ref 5–15)
BUN: 18 mg/dL (ref 6–20)
CO2: 23 mmol/L (ref 22–32)
Calcium: 9.3 mg/dL (ref 8.9–10.3)
Chloride: 102 mmol/L (ref 98–111)
Creatinine, Ser: 1.67 mg/dL — ABNORMAL HIGH (ref 0.61–1.24)
GFR, Estimated: 47 mL/min — ABNORMAL LOW (ref >60)
Glucose, Bld: 128 mg/dL — ABNORMAL HIGH (ref 70–99)
Potassium: 4.6 mmol/L (ref 3.5–5.1)
Sodium: 133 mmol/L — ABNORMAL LOW (ref 135–145)
Total Bilirubin: 1.8 mg/dL — ABNORMAL HIGH (ref 0.3–1.2)
Total Protein: 7.9 g/dL (ref 6.5–8.1)

## 2021-09-01 LAB — CBC WITH DIFFERENTIAL/PLATELET
Abs Immature Granulocytes: 0.01 10*3/uL (ref 0.00–0.07)
Basophils Absolute: 0 10*3/uL (ref 0.0–0.1)
Basophils Relative: 1 %
Eosinophils Absolute: 0.1 10*3/uL (ref 0.0–0.5)
Eosinophils Relative: 2 %
HCT: 38 % — ABNORMAL LOW (ref 39.0–52.0)
Hemoglobin: 13.3 g/dL (ref 13.0–17.0)
Immature Granulocytes: 0 %
Lymphocytes Relative: 41 %
Lymphs Abs: 1.7 10*3/uL (ref 0.7–4.0)
MCH: 34.2 pg — ABNORMAL HIGH (ref 26.0–34.0)
MCHC: 35 g/dL (ref 30.0–36.0)
MCV: 97.7 fL (ref 80.0–100.0)
Monocytes Absolute: 0.3 10*3/uL (ref 0.1–1.0)
Monocytes Relative: 7 %
Neutro Abs: 2.1 10*3/uL (ref 1.7–7.7)
Neutrophils Relative %: 49 %
Platelets: 163 10*3/uL (ref 150–400)
RBC: 3.89 MIL/uL — ABNORMAL LOW (ref 4.22–5.81)
RDW: 12 % (ref 11.5–15.5)
WBC: 4.3 10*3/uL (ref 4.0–10.5)
nRBC: 0 % (ref 0.0–0.2)

## 2021-09-01 LAB — ETHANOL: Alcohol, Ethyl (B): <10 mg/dL (ref <10)

## 2021-09-01 LAB — TROPONIN I (HIGH SENSITIVITY): Troponin I (High Sensitivity): 6 ng/L (ref <18)

## 2021-09-01 NOTE — ED Triage Notes (Signed)
Arrived by EMS from home with c/o weakness and emesis. Emesis X2. Also c/o right arm numbness. A&O x4. 18G L AC. EMS vitals 143 CBG, 122/75 b/p.

## 2021-09-01 NOTE — ED Provider Triage Note (Signed)
Emergency Medicine Provider Triage Evaluation Note  James Padilla , a 56 y.o. male  was evaluated in triage.  Pt complains of mental status, possible headache, possible neck pain, possible chest pain.  Patient arrives via EMS.  According to EMS patient was alert and oriented x4.  In the room patient is unable to answer basic questions, states that "something is not right."  Patient does endorse a headache, does endorse neck pain does endorse chest pain.  Patient was unable to provide much other history.  Reportedly patient was picked up from the gas station for "not acting right.".  Patient's medical history includes homelessness, chronic neck pain, right arm pain, headaches, cocaine use, hypertension, emphysema, coronary artery disease.  Review of Systems  Positive: Altered mental status, headache, neck pain, chest pain Negative:   Physical Exam  BP 112/78 (BP Location: Right Arm)    Pulse (!) 52    Temp (!) 97.3 F (36.3 C)    Resp 17    Ht 5\' 5"  (1.651 m)    Wt 55.3 kg    SpO2 96%    BMI 20.29 kg/m  Gen:   Awake, no distress.  Patient does appear confused, slurring words, unable provide much medical history Resp:  Normal effort no appreciable wheezing, rales, rhonchi.  No coughing in the exam room MSK:   Moves extremities without difficulty  Other:  Patient very sluggish, cannot complete cranial nerve testing at this time  Medical Decision Making  Medically screening exam initiated at 4:11 PM.  Appropriate orders placed.  BENJAMIN CASANAS was informed that the remainder of the evaluation will be completed by another provider, this initial triage assessment does not replace that evaluation, and the importance of remaining in the ED until their evaluation is complete.  Patient arrives with altered mental status.  Reportedly was alert and oriented x4 according to EMS when he picked him up outside of a gas station, however patient is unable provide most of his history at this time.  Does appear that  he is complaining of a headache, neck pain and chest pain.  Review of medical record, patient did see primary care in November 2022.  Has chronic neck/cervical pain, has been discharged from neurosurgery due to drug use.  Patient admits to ongoing cocaine use at his primary care visit.  Patient has coronary artery disease, emphysema but does not appear to be taking medications for same.  Patient also has major depressive disorder according to primary care note and takes Depakote for anger issues.  At this time patient will have CT scans of the head, neck, chest x-ray, labs, urine, UDS, ethanol level   Darletta Moll, PA-C 09/01/21 1611

## 2021-09-01 NOTE — ED Notes (Signed)
Patient reports back and neck pain. Patient reports he vomited in a convenience store. Patient drowsy. PA in triage room assessing patient.

## 2021-09-02 MED ORDER — ONDANSETRON 4 MG PO TBDP
4.0000 mg | ORAL_TABLET | Freq: Once | ORAL | Status: AC
  Administered 2021-09-02: 4 mg via ORAL
  Filled 2021-09-02: qty 1

## 2021-09-02 MED ORDER — ONDANSETRON HCL 4 MG PO TABS: 4.0000 mg | ORAL_TABLET | Freq: Every day | ORAL | 0 refills | Status: DC | PRN

## 2021-09-02 NOTE — ED Notes (Signed)
Had 18g L AC peripheral IV from EMS- removed at time of discharge. Not originally documented in LDA's.

## 2021-09-02 NOTE — ED Provider Notes (Signed)
[facility] Provider Note    Event Date/Time   First MD Initiated Contact with Patient 09/02/21 0005     (approximate)  History   Chief Complaint: Emesis  HPI  James Padilla is a 56 y.o. male with a past medical history of anxiety, depression, hypertension, chronic neck pain, presents to the emergency department for nausea and vomiting.  According to the patient he was at a gas station earlier today getting lottery tickets when he began feeling nauseated had several episodes of vomiting.  Patient had the gas station called EMS which brought the patient to the emergency department for further evaluation.  Initially patient was complaining of some right arm numbness however he states this is chronic from chronic neck pain.  Denies any acute neurological symptoms.  Denies any fever cough.  Patient has been in the emergency department 8 hours at this time, states he is feeling better states he wishes to go home so we can get some rest.   Physical Exam   Triage Vital Signs: ED Triage Vitals  Enc Vitals Group     BP 09/01/21 1604 112/78     Pulse Rate 09/01/21 1604 (!) 52     Resp 09/01/21 1604 17     Temp 09/01/21 1604 (!) 97.3 F (36.3 C)     Temp Source 09/01/21 2059 Oral     SpO2 09/01/21 1604 96 %     Weight 09/01/21 1605 121 lb 14.6 oz (55.3 kg)     Height 09/01/21 1605 5\' 5"  (1.651 m)     Head Circumference --      Peak Flow --      Pain Score 09/01/21 1605 8     Pain Loc --      Pain Edu? --      Excl. in [place]? --     Most recent vital signs: Vitals:   09/01/21 1604 09/01/21 2059  BP: 112/78 102/69  Pulse: (!) 52 68  Resp: 17 14  Temp: (!) 97.3 F (36.3 C) (!) 97.5 F (36.4 C)  SpO2: 96% 96%    General: Awake, no distress.  CV:  Good peripheral perfusion.  Regular rate and rhythm  Resp:  Normal effort.  Equal breath sounds bilaterally.  Abd:  No distention.  Soft, nontender.  No rebound or guarding. Other:  Good range of motion in all  extremities.  No focal deficits appreciated on examination.   ED Results / Procedures / Treatments   EKG  EKG viewed and interpreted by myself shows sinus bradycardia 53 bpm with a narrow QRS, normal axis, normal intervals.  Patient does have inferolateral T wave inversions.  T wave inversions are also present on my review of the prior EKG 11/10/2018.  RADIOLOGY  I reviewed chest x-ray images.  No acute issue found on my evaluation.  Radiology read pending.  Chest x-ray is negative as read by radiology. CT scan of the head and C-spine are negative per radiology for acute abnormality.   MEDICATIONS ORDERED IN ED: Medications  ondansetron (ZOFRAN-ODT) disintegrating tablet 4 mg (has no administration in time range)     IMPRESSION / MDM / ASSESSMENT AND PLAN / ED COURSE  I reviewed the triage vital signs and the nursing notes.  Patient presents emergency department for cute onset of nausea and vomiting while at a gas station around 2 PM yesterday 09/01/2021.  Here the patient states he is feeling much better.  No longer feels nauseated.  No  abdominal tenderness on exam.  Denies any fever or cough.  I discussed options with the patient including a CT scan of the abdomen/pelvis however patient states he has been here very long time and is ready to go home.  As the patient has been in the waiting room for 8 hours and is feeling well I believe the patient would be safe for discharge home with a trial of antiemetics such as Zofran.  Patient also dose Zofran in the emergency department as he will be unable to fill his prescription tonight.  I discussed very strict return precautions for any abdominal pain return of nausea vomiting or any other symptom personally concerning to the patient.  I also discussed a COVID/flu swab given the acute onset of nausea vomiting however patient wishes to avoid this.  Given the patient's reassuring work-up I do not believe at this time the patient warrants admission to  the hospital we will discharge home per his wishes.  Patient agreeable to return precautions.  FINAL CLINICAL IMPRESSION(S) / ED DIAGNOSES   Nausea vomiting Weakness  Rx / DC Orders   Zofran  Note:  This document was prepared using Dragon voice recognition software and may include unintentional dictation errors.   Harvest Dark, MD 09/02/21 [phone]

## 2021-09-03 ENCOUNTER — Telehealth: Payer: Self-pay

## 2021-09-03 DIAGNOSIS — Z789 Other specified health status: Secondary | ICD-10-CM

## 2021-09-03 NOTE — Telephone Encounter (Signed)
Transition Care Management Follow-up Telephone Call Date of discharge and from where: 09/02/2021 from [facility] How have you been since you were released from the hospital? Pt stated that he is feeling some better.  Any questions or concerns? Yes Patient stated that he is staying at a friends house.   Items Reviewed: Did the pt receive and understand the discharge instructions provided? Yes  Medications obtained and verified? Yes  Other? No  Any new allergies since your discharge? No  Dietary orders reviewed? No Do you have support at home? Yes   Functional Questionnaire: (I = Independent and D = Dependent) ADLs: I  Bathing/Dressing- I  Meal Prep- I  Eating- I  Maintaining continence- I  Transferring/Ambulation- I  Managing Meds- I  Follow up appointments reviewed:  PCP Hospital f/u appt confirmed? No  Scheduled to see Teodora Medici, DO on 09/04/2021 @ 03:20pm. [place] [facility] f/u appt confirmed? No   Are transportation arrangements needed? Yes  If their condition worsens, is the pt aware to call PCP or go to the Emergency Dept.? Yes Was the patient provided with contact information for the PCP's office or ED? Yes Was to pt encouraged to call back with questions or concerns? Yes

## 2021-09-04 ENCOUNTER — Ambulatory Visit: Payer: Medicaid Other | Admitting: Internal Medicine

## 2021-09-04 ENCOUNTER — Encounter: Payer: Self-pay | Admitting: Internal Medicine

## 2021-09-04 VITALS — BP 132/80 | HR 68 | Temp 98.1°F | Resp 16 | Ht 65.0 in | Wt 129.3 lb

## 2021-09-04 DIAGNOSIS — I10 Essential (primary) hypertension: Secondary | ICD-10-CM

## 2021-09-04 DIAGNOSIS — E785 Hyperlipidemia, unspecified: Secondary | ICD-10-CM | POA: Diagnosis not present

## 2021-09-04 DIAGNOSIS — E871 Hypo-osmolality and hyponatremia: Secondary | ICD-10-CM | POA: Diagnosis not present

## 2021-09-04 DIAGNOSIS — R531 Weakness: Secondary | ICD-10-CM

## 2021-09-04 NOTE — Progress Notes (Signed)
Established Patient Office Visit  Subjective:  Patient ID: James Padilla, male    DOB: 1966/02/23  Age: 56 y.o. MRN: [id number]  CC:  Chief Complaint  Patient presents with   Hospitalization Follow-up        Dizziness    HPI James Padilla presents for ER FU and nausea/lightheadedness. Patient is a poor historian with pressured and tangential speech. States one night last week he was at a store and all of a sudden he felt right sided weakness, diaphoretic and nauseous. He also noticed some speech difficulty but the symptoms lasted about 5-6 seconds. He then was able to walk home and was sitting on the porch when it hit him again, feeling nauseous, weak and tremulous. This episode lasted 10 seconds. Someone called EMS and by the time they got there, he still felt weak but was able to walk and speak. He denied chest pain, shortness of breath. He states this has happened previously, last time over the summer. He has not had similar symptoms since he went to the ER. He does have a history of substance use, states he hasn't used anything since the new year and has stopped drinking as well. Is doing well from that stand point, no withdrawal symptoms. Is concerned about family history of diabetes, worried abnormal sugars could have contributed to symptoms.  Discharge Date: 09/01/21 Hospital/facility: [facility] Diagnosis: Nausea, vomiting, weakness Procedures/tests: Labs - Cr 1.67, sodium 133. CT Head - normal. CT Neck - significant disc degenerative changes. Consultants: None New medications: Zofran PRN Discontinued medications: None Status: better   Past Medical History:  Diagnosis Date   Abscess 12/17/2018   Anxiety    Depression    Dyspnea    Hypertension    Neck pain    Weakness of extremity Right arm and hand    Past Surgical History:  Procedure Laterality Date   COLONOSCOPY WITH PROPOFOL N/A 10/15/2018   Procedure: COLONOSCOPY WITH PROPOFOL;  Surgeon: Jonathon Bellows, MD;  Location: [facility]  ENDOSCOPY;  Service: Gastroenterology;  Laterality: N/A;   MULTIPLE TOOTH EXTRACTIONS      Family History  Problem Relation Age of Onset   Breast cancer Mother    Aneurysm Father     Social History   Socioeconomic History   Marital status: Single    Spouse name: Not on file   Number of children: 2   Years of education: 12   Highest education level: High school graduate  Occupational History   Occupation: unemployed  Tobacco Use   Smoking status: Every Day    Packs/day: 0.75    Years: 40.00    Pack years: 30.00    Types: Cigarettes    Start date: 04/12/1982   Smokeless tobacco: Never  Vaping Use   Vaping Use: Never used  Substance and Sexual Activity   Alcohol use: Not Currently    Comment: rarely    Drug use: Yes    Frequency: 2.0 times per week    Types: Marijuana, Cocaine   Sexual activity: Not Currently    Partners: Female  Other Topics Concern   Not on file  Social History Narrative   Lives in a friends house, sofa    Social Determinants of Health   Financial Resource Strain: High Risk   Difficulty of Paying Living Expenses: Hard  Food Insecurity: No Food Insecurity   Worried About [employer] in the Last Year: Never true   [place] in the Last Year:  Never true  Transportation Needs: Public librarian (Medical): Yes   Lack of Transportation (Non-Medical): Yes  Physical Activity: Inactive   Days of Exercise per Week: 0 days   Minutes of Exercise per Session: 0 min  Stress: Stress Concern Present   Feeling of Stress : Very much  Social Connections: Unknown   Frequency of Communication with Friends and Family: Not on file   Frequency of Social Gatherings with Friends and Family: Never   Attends Religious Services: 1 to 4 times per year   Active Member of [employer] or Organizations: Yes   Attends Archivist Meetings: Never   Marital Status: Never married  Human resources officer Violence: Not At Risk    Fear of Current or Ex-Partner: No   Emotionally Abused: No   Physically Abused: No   Sexually Abused: No    Outpatient Medications Prior to Visit  Medication Sig Dispense Refill   atenolol (TENORMIN) 25 MG tablet Take 1 tablet (25 mg total) by mouth every evening. 90 tablet 1   atorvastatin (LIPITOR) 20 MG tablet Take 1 tablet (20 mg total) by mouth daily. For your heart 90 tablet 1   divalproex (DEPAKOTE) 250 MG DR tablet Take 1 tablet (250 mg total) by mouth in the morning and at bedtime. For mood 180 tablet 1   EQ ASPIRIN ADULT LOW DOSE 81 MG EC tablet Take 1 tablet by mouth once daily 30 tablet 0   Fluticasone-Umeclidin-Vilant (TRELEGY ELLIPTA) 100-62.5-25 MCG/ACT AEPB Inhale 1 puff into the lungs daily. 3 each 1   gabapentin (NEURONTIN) 300 MG capsule Take 2 capsules (600 mg total) by mouth 3 (three) times daily. 180 capsule 1   losartan-hydrochlorothiazide (HYZAAR) 50-12.5 MG tablet Take 1 tablet by mouth daily. For blood pressure 90 tablet 1   ondansetron (ZOFRAN) 4 MG tablet Take 1 tablet (4 mg total) by mouth daily as needed for nausea or vomiting. 20 tablet 0   tiZANidine (ZANAFLEX) 2 MG tablet TAKE 1 TABLET BY MOUTH EVERY 8 HOURS AS NEEDED FOR MUSCLE SPASM 20 tablet 0   triamcinolone cream (KENALOG) 0.1 % Apply 1 application topically 2 (two) times daily. 30 g 0   No facility-administered medications prior to visit.    Allergies  Allergen Reactions   Septra [Sulfamethoxazole-Trimethoprim] Other (See Comments)    Renal Failure   Lactose Nausea And Vomiting    ROS Review of Systems  Constitutional:  Negative for chills, diaphoresis and fever.  Eyes:  Negative for visual disturbance.  Respiratory:  Negative for cough and shortness of breath.   Cardiovascular:  Negative for chest pain and palpitations.  Gastrointestinal:  Negative for abdominal pain and nausea.  Neurological:  Positive for weakness and light-headedness. Negative for syncope and headaches.     Objective:     Physical Exam Constitutional:      Appearance: Normal appearance.  HENT:     Head: Normocephalic and atraumatic.     Mouth/Throat:     Mouth: Mucous membranes are moist.     Pharynx: Oropharynx is clear.  Eyes:     Extraocular Movements: Extraocular movements intact.     Conjunctiva/sclera: Conjunctivae normal.     Pupils: Pupils are equal, round, and reactive to light.  Cardiovascular:     Rate and Rhythm: Normal rate and regular rhythm.  Pulmonary:     Effort: Pulmonary effort is normal.     Breath sounds: Normal breath sounds.  Musculoskeletal:     Right lower  leg: No edema.     Left lower leg: No edema.  Skin:    General: Skin is warm and dry.  Neurological:     General: No focal deficit present.     Mental Status: He is alert. Mental status is at baseline.     Cranial Nerves: No cranial nerve deficit.  Psychiatric:        Mood and Affect: Affect normal. Mood is anxious.        Speech: Speech is tangential.        Behavior: Behavior is hyperactive.    BP 132/80    Pulse 68    Temp 98.1 F (36.7 C)    Resp 16    Ht 5\' 5"  (1.651 m)    Wt 129 lb 4.8 oz (58.7 kg)    SpO2 97%    BMI 21.52 kg/m  Wt Readings from Last 3 Encounters:  09/01/21 121 lb 14.6 oz (55.3 kg)  07/13/21 122 lb (55.3 kg)  04/11/21 122 lb (55.3 kg)     Health Maintenance Due  Topic Date Due   COVID-19 Vaccine (1) Never done   INFLUENZA VACCINE  03/26/2021   Pneumococcal Vaccine 56-23 Years old (2 - PCV) 10/04/2021    There are no preventive care reminders to display for this patient.  No results found for: TSH Lab Results  Component Value Date   WBC 4.3 09/01/2021   HGB 13.3 09/01/2021   HCT 38.0 (L) 09/01/2021   MCV 97.7 09/01/2021   PLT 163 09/01/2021   Lab Results  Component Value Date   NA 133 (L) 09/01/2021   K 4.6 09/01/2021   CO2 23 09/01/2021   GLUCOSE 128 (H) 09/01/2021   BUN 18 09/01/2021   CREATININE 1.67 (H) 09/01/2021   BILITOT 1.8 (H) 09/01/2021   ALKPHOS 67  09/01/2021   AST 34 09/01/2021   ALT 25 09/01/2021   PROT 7.9 09/01/2021   ALBUMIN 4.0 09/01/2021   CALCIUM 9.3 09/01/2021   ANIONGAP 8 09/01/2021   Lab Results  Component Value Date   CHOL 123 10/04/2020   Lab Results  Component Value Date   HDL 77 10/04/2020   Lab Results  Component Value Date   LDLCALC 31 10/04/2020   Lab Results  Component Value Date   TRIG 69 10/04/2020   Lab Results  Component Value Date   CHOLHDL 1.6 10/04/2020   Lab Results  Component Value Date   HGBA1C 5.4 10/05/2018      Assessment & Plan:   1. Weakness/Hyponatremia/Essential hypertension/Hyperlipidemia, unspecified hyperlipidemia type: Having some residual weakness. Sodium in ER slightly low at 133, will recheck and an A1c as well due to concerns about diabetes. Symptoms could represent a TIA, reviewed CT Head which was negative. Modify risk factors including cholesterol, HTN. BP at goal here today with good medication compliance. Recheck lipid panel. Continue daily aspirin. Discussed should symptoms reoccur to present to emergency department.  - Basic Metabolic Panel (BMET) - HgB A1c - Lipid Profile   Follow-up: Return if symptoms worsen or fail to improve, for already scheduled.    Teodora Medici, DO

## 2021-09-04 NOTE — Patient Instructions (Addendum)
It was great seeing you today!  Plan discussed at today's visit: -Blood work ordered today, results will be uploaded to [place].  -Continue to stay well hydrated  Follow up in: follow up scheduled in May  Take care and let us know if you have any questions or concerns prior to your next visit.  Dr. Rosana Berger

## 2021-09-05 LAB — BASIC METABOLIC PANEL
BUN/Creatinine Ratio: 8 (calc) (ref 6–22)
BUN: 14 mg/dL (ref 7–25)
CO2: 29 mmol/L (ref 20–32)
Calcium: 9.1 mg/dL (ref 8.6–10.3)
Chloride: 104 mmol/L (ref 98–110)
Creat: 1.86 mg/dL — ABNORMAL HIGH (ref 0.70–1.30)
Glucose, Bld: 92 mg/dL (ref 65–99)
Potassium: 4.2 mmol/L (ref 3.5–5.3)
Sodium: 139 mmol/L (ref 135–146)

## 2021-09-05 LAB — LIPID PANEL
Cholesterol: 95 mg/dL (ref <200)
HDL: 66 mg/dL (ref >=40)
LDL Cholesterol (Calc): 15 mg/dL (calc)
Non-HDL Cholesterol (Calc): 29 mg/dL (calc) (ref <130)
Total CHOL/HDL Ratio: 1.4 (calc) (ref <5.0)
Triglycerides: 68 mg/dL (ref <150)

## 2021-09-05 LAB — HEMOGLOBIN A1C
Hgb A1c MFr Bld: 5.5 % of total Hgb (ref <5.7)
Mean Plasma Glucose: 111 mg/dL
eAG (mmol/L): 6.2 mmol/L

## 2021-09-12 ENCOUNTER — Other Ambulatory Visit: Payer: Self-pay

## 2021-09-12 NOTE — Patient Instructions (Signed)
Visit Information  [name]  - as a part of your Medicaid benefit, you are eligible for care management and care coordination services at no cost or copay. I was unable to reach you by phone today but would be happy to help you with your health related needs. Please feel free to call me @ [phone]   A member of the Managed Medicaid care management team will reach out to you again over the next 7 days.   [name], BSW, [place] Managed Medicaid Team  [phone]

## 2021-09-12 NOTE — Patient Outreach (Signed)
Care Coordination  09/12/2021  James Padilla May 04, 1966 [id number]   Medicaid Managed Care   Unsuccessful Outreach Note  09/12/2021 Name: James Padilla MRN: [id number] DOB: Mar 16, 1966  Referred by: Steele Sizer, MD Reason for referral : High Risk Managed Medicaid (MM social work [employer])   An unsuccessful telephone outreach was attempted today. The patient was referred to the case management team for assistance with care management and care coordination.   Follow Up Plan: The care management team will reach out to the patient again over the next 7 days.   Marland Kitchenajs

## 2021-09-21 ENCOUNTER — Other Ambulatory Visit: Payer: Self-pay

## 2021-09-21 NOTE — Patient Outreach (Signed)
Care Coordination  09/21/2021  James Padilla 10-Jan-1966 [id number]   Medicaid Managed Care   Unsuccessful Outreach Note  09/21/2021 Name: James Padilla MRN: [id number] DOB: 30-Apr-1966  Referred by: Steele Sizer, MD Reason for referral : High Risk Managed Medicaid (MM Social Work Telephone Unsuccessful Telephone Outreach)   An unsuccessful telephone outreach was attempted today. The patient was referred to the case management team for assistance with care management and care coordination.   Follow Up Plan: The care management team will reach out to the patient again over the next 7 days.   [name], BSW, [place] Managed Medicaid Team  [phone]

## 2021-09-21 NOTE — Patient Instructions (Signed)
Visit Information  [name]  - as a part of your Medicaid benefit, you are eligible for care management and care coordination services at no cost or copay. I was unable to reach you by phone today but would be happy to help you with your health related needs. Please feel free to call me @ [phone]   A member of the Managed Medicaid care management team will reach out to you again over the next 7 days.  [name], BSW, [place] Managed Medicaid Team  [phone]

## 2021-09-26 ENCOUNTER — Other Ambulatory Visit: Payer: Self-pay | Admitting: *Deleted

## 2021-09-26 DIAGNOSIS — Z87891 Personal history of nicotine dependence: Secondary | ICD-10-CM

## 2021-09-26 DIAGNOSIS — F17210 Nicotine dependence, cigarettes, uncomplicated: Secondary | ICD-10-CM

## 2021-10-05 ENCOUNTER — Other Ambulatory Visit: Payer: Self-pay

## 2021-10-05 NOTE — Patient Instructions (Signed)
Visit Information  [name]  - as a part of your Medicaid benefit, you are eligible for care management and care coordination services at no cost or copay. I was unable to reach you by phone today but would be happy to help you with your health related needs. Please feel free to call me @ [phone].   [name], BSW, [place] Managed Medicaid Team  [phone]

## 2021-10-05 NOTE — Patient Outreach (Signed)
Care Coordination  10/05/2021  James Padilla 03-04-1966 [id number]   Medicaid Managed Care   Unsuccessful Outreach Note  10/05/2021 Name: James Padilla MRN: [id number] DOB: 11-11-1965  Referred by: Steele Sizer, MD Reason for referral : High Risk Managed Medicaid (MM Social Work Telephone Unsuccessful telephone outreach)   An unsuccessful telephone outreach was attempted today. The patient was referred to the case management team for assistance with care management and care coordination.   Follow Up Plan: The patient has been provided with contact information for the care management team and has been advised to call with any health related questions or concerns.   [name], BSW, [place] Managed Medicaid Team  [phone]

## 2021-10-10 ENCOUNTER — Ambulatory Visit
Admission: RE | Admit: 2021-10-10 | Discharge: 2021-10-10 | Disposition: A | Discharge status: Home / Self Care | Payer: Medicaid Other | Source: Ambulatory Visit | Attending: Acute Care | Admitting: Acute Care

## 2021-10-10 ENCOUNTER — Other Ambulatory Visit: Payer: Self-pay

## 2021-10-10 DIAGNOSIS — Z87891 Personal history of nicotine dependence: Secondary | ICD-10-CM | POA: Diagnosis present

## 2021-10-10 DIAGNOSIS — F17210 Nicotine dependence, cigarettes, uncomplicated: Secondary | ICD-10-CM | POA: Insufficient documentation

## 2021-10-12 ENCOUNTER — Other Ambulatory Visit: Payer: Self-pay

## 2021-10-12 DIAGNOSIS — Z87891 Personal history of nicotine dependence: Secondary | ICD-10-CM

## 2021-10-12 DIAGNOSIS — F17210 Nicotine dependence, cigarettes, uncomplicated: Secondary | ICD-10-CM

## 2021-10-31 ENCOUNTER — Other Ambulatory Visit: Payer: Self-pay | Admitting: Internal Medicine

## 2021-10-31 DIAGNOSIS — M5441 Lumbago with sciatica, right side: Secondary | ICD-10-CM

## 2021-10-31 NOTE — Progress Notes (Signed)
Name: James Padilla   MRN: [id number]    DOB: 1966/03/02   Date:11/01/2021       Progress Note  Subjective  Chief Complaint  Discuss Lung CT  HPI  James Padilla came in today to discuss lung CT, he was concerned about atherosclerosis of aorta . I explained to him that we have discussed it with him in the past and that he is on medical management ( statin, aspirin and bp control) , he also had CAD and explained cocaine can increase his risk of heart attack. I also explained he has emphysema but is on medical management also   Back pain/chronic: he states Tizanidine helped with his symptoms and would like a refill. His pain is constant, uses a cane at times to help with ambulation and would like a handicap sticker   Patient Active Problem List   Diagnosis Date Noted   Centrilobular emphysema ([place]) 10/04/2020   Atherosclerosis of aorta ([place]) 10/04/2020   CAD in native artery 10/04/2020   Neuropathy 08/29/2020   Essential hypertension 01/04/2019   Crack cocaine use 12/17/2018   Sebaceous cyst 12/17/2018   Nonintractable episodic headache 10/05/2018   Frequency of urination and polyuria 10/05/2018   Irritability and anger 10/05/2018   Homelessness 09/19/2018   Chronic neck pain 09/19/2018   Chronic bilateral back pain 09/19/2018   Right arm pain 08/14/2018    Past Surgical History:  Procedure Laterality Date   COLONOSCOPY WITH PROPOFOL N/A 10/15/2018   Procedure: COLONOSCOPY WITH PROPOFOL;  Surgeon: Jonathon Bellows, MD;  Location: [facility] ENDOSCOPY;  Service: Gastroenterology;  Laterality: N/A;   MULTIPLE TOOTH EXTRACTIONS      Family History  Problem Relation Age of Onset   Breast cancer Mother    Aneurysm Father     Social History   Tobacco Use   Smoking status: Every Day    Packs/day: 0.75    Years: 40.00    Pack years: 30.00    Types: Cigarettes    Start date: 04/12/1982   Smokeless tobacco: Never  Substance Use Topics   Alcohol use: Not Currently    Comment: rarely       Current Outpatient Medications:    atenolol (TENORMIN) 25 MG tablet, Take 1 tablet (25 mg total) by mouth every evening., Disp: 90 tablet, Rfl: 1   atorvastatin (LIPITOR) 20 MG tablet, Take 1 tablet (20 mg total) by mouth daily. For your heart, Disp: 90 tablet, Rfl: 1   divalproex (DEPAKOTE) 250 MG DR tablet, Take 1 tablet (250 mg total) by mouth in the morning and at bedtime. For mood, Disp: 180 tablet, Rfl: 1   EQ ASPIRIN ADULT LOW DOSE 81 MG EC tablet, Take 1 tablet by mouth once daily, Disp: 30 tablet, Rfl: 0   Fluticasone-Umeclidin-Vilant (TRELEGY ELLIPTA) 100-62.5-25 MCG/ACT AEPB, Inhale 1 puff into the lungs daily., Disp: 3 each, Rfl: 1   gabapentin (NEURONTIN) 300 MG capsule, Take 2 capsules (600 mg total) by mouth 3 (three) times daily., Disp: 180 capsule, Rfl: 1   losartan-hydrochlorothiazide (HYZAAR) 50-12.5 MG tablet, Take 1 tablet by mouth daily. For blood pressure, Disp: 90 tablet, Rfl: 1   tiZANidine (ZANAFLEX) 2 MG tablet, TAKE 1 TABLET BY MOUTH EVERY 8 HOURS AS NEEDED FOR MUSCLE SPASM, Disp: 20 tablet, Rfl: 0   triamcinolone cream (KENALOG) 0.1 %, Apply 1 application topically 2 (two) times daily., Disp: 30 g, Rfl: 0  Allergies  Allergen Reactions   Septra [Sulfamethoxazole-Trimethoprim] Other (See Comments)    Renal Failure  Lactose Nausea And Vomiting    I personally reviewed active problem list, medication list, allergies, family history, social history, health maintenance with the patient/caregiver today.   ROS  Ten systems reviewed and is negative except as mentioned in HPI   Objective  Vitals:   11/01/21 1131  BP: 116/64  Pulse: 65  Resp: 16  Temp: 97.6 F (36.4 C)  TempSrc: Oral  SpO2: 97%  Weight: 121 lb 12.8 oz (55.2 kg)  Height: '5\' 4"'$  (1.626 m)    Body mass index is 20.91 kg/m.  Physical Exam  Constitutional: Patient appears well-developed and very thin No distress.  HEENT: head atraumatic, normocephalic, pupils equal and reactive to  light neck supple Cardiovascular: Normal rate, regular rhythm and normal heart sounds.  No murmur heard. No BLE edema. Pulmonary/Chest: Effort normal and breath sounds normal. No respiratory distress. Abdominal: Soft.  There is no tenderness. Muscular skeletal: pain during palpation of lumbar spine, slow gait Psychiatric: Patient has a normal mood and affect. behavior is normal. Judgment and thought content normal.   Recent Results (from the past 2160 hour(s))  Comprehensive metabolic panel     Status: Abnormal   Collection Time: 09/01/21  4:05 PM  Result Value Ref Range   Sodium 133 (L) 135 - 145 mmol/L   Potassium 4.6 3.5 - 5.1 mmol/L    Comment: HEMOLYSIS AT THIS LEVEL MAY AFFECT RESULT   Chloride 102 98 - 111 mmol/L   CO2 23 22 - 32 mmol/L   Glucose, Bld 128 (H) 70 - 99 mg/dL    Comment: Glucose reference range applies only to samples taken after fasting for at least 8 hours.   BUN 18 6 - 20 mg/dL   Creatinine, Ser 1.67 (H) 0.61 - 1.24 mg/dL   Calcium 9.3 8.9 - 10.3 mg/dL   Total Protein 7.9 6.5 - 8.1 g/dL   Albumin 4.0 3.5 - 5.0 g/dL   AST 34 15 - 41 U/L    Comment: HEMOLYSIS AT THIS LEVEL MAY AFFECT RESULT   ALT 25 0 - 44 U/L   Alkaline Phosphatase 67 38 - 126 U/L   Total Bilirubin 1.8 (H) 0.3 - 1.2 mg/dL    Comment: HEMOLYSIS AT THIS LEVEL MAY AFFECT RESULT   GFR, Estimated 47 (L) >60 mL/min    Comment: (NOTE) Calculated using the CKD-EPI Creatinine Equation (2021)    Anion gap 8 5 - 15    Comment: Performed at [facility], [address] [place], [place] [address]  Ethanol     Status: None   Collection Time: 09/01/21  4:05 PM  Result Value Ref Range   Alcohol, Ethyl (B) <10 <10 mg/dL    Comment: (NOTE) Lowest detectable limit for serum alcohol is 10 mg/dL.  For medical purposes only. Performed at [facility], [place], [place] [address]   Troponin I (High Sensitivity)     Status: None   Collection Time: 09/01/21  4:05 PM   Result Value Ref Range   Troponin I (High Sensitivity) 6 <18 ng/L    Comment: (NOTE) Elevated high sensitivity troponin I (hsTnI) values and significant  changes across serial measurements may suggest ACS but many other  chronic and acute conditions are known to elevate hsTnI results.  Refer to the "Links" section for chest pain algorithms and additional  guidance. Performed at [facility], [address], [place], [place] [address]   CBC with Differential     Status: Abnormal  Collection Time: 09/01/21  4:05 PM  Result Value Ref Range   WBC 4.3 4.0 - 10.5 K/uL   RBC 3.89 (L) 4.22 - 5.81 MIL/uL   Hemoglobin 13.3 13.0 - 17.0 g/dL   HCT 38.0 (L) 39.0 - 52.0 %   MCV 97.7 80.0 - 100.0 fL   MCH 34.2 (H) 26.0 - 34.0 pg   MCHC 35.0 30.0 - 36.0 g/dL   RDW 12.0 11.5 - 15.5 %   Platelets 163 150 - 400 K/uL   nRBC 0.0 0.0 - 0.2 %   Neutrophils Relative % 49 %   Neutro Abs 2.1 1.7 - 7.7 K/uL   Lymphocytes Relative 41 %   Lymphs Abs 1.7 0.7 - 4.0 K/uL   Monocytes Relative 7 %   Monocytes Absolute 0.3 0.1 - 1.0 K/uL   Eosinophils Relative 2 %   Eosinophils Absolute 0.1 0.0 - 0.5 K/uL   Basophils Relative 1 %   Basophils Absolute 0.0 0.0 - 0.1 K/uL   Immature Granulocytes 0 %   Abs Immature Granulocytes 0.01 0.00 - 0.07 K/uL    Comment: Performed at [facility], [address] [place], [place] [address]  Basic Metabolic Panel (BMET)     Status: Abnormal   Collection Time: 09/04/21  4:00 PM  Result Value Ref Range   Glucose, Bld 92 65 - 99 mg/dL    Comment: .            Fasting reference interval .    BUN 14 7 - 25 mg/dL   Creat 1.86 (H) 0.70 - 1.30 mg/dL   BUN/Creatinine Ratio 8 6 - 22 (calc)   Sodium 139 135 - 146 mmol/L   Potassium 4.2 3.5 - 5.3 mmol/L   Chloride 104 98 - 110 mmol/L   CO2 29 20 - 32 mmol/L   Calcium 9.1 8.6 - 10.3 mg/dL  HgB A1c     Status: None   Collection Time: 09/04/21  4:00 PM  Result Value Ref Range   Hgb A1c MFr Bld 5.5 <5.7  % of total Hgb    Comment: For the purpose of screening for the presence of diabetes: . <5.7%       Consistent with the absence of diabetes 5.7-6.4%    Consistent with increased risk for diabetes             (prediabetes) > or =6.5%  Consistent with diabetes . This assay result is consistent with a decreased risk of diabetes. . Currently, no consensus exists regarding use of hemoglobin A1c for diagnosis of diabetes in children. . According to American Diabetes Association (ADA) guidelines, hemoglobin A1c <7.0% represents optimal control in non-pregnant diabetic patients. Different metrics may apply to specific patient populations.  Standards of Medical Care in Diabetes(ADA). .    Mean Plasma Glucose 111 mg/dL   eAG (mmol/L) 6.2 mmol/L  Lipid Profile     Status: None   Collection Time: 09/04/21  4:00 PM  Result Value Ref Range   Cholesterol 95 <200 mg/dL   HDL 66 > OR = 40 mg/dL   Triglycerides 68 <150 mg/dL   LDL Cholesterol (Calc) 15 mg/dL (calc)    Comment: Reference range: <100 . Desirable range <100 mg/dL for primary prevention;   <70 mg/dL for patients with CHD or diabetic patients  with > or = 2 CHD risk factors. [name] LDL-C is now calculated using the Martin-Hopkins  calculation, which is a validated novel method providing  better accuracy than the [employer]  equation in the  estimation of LDL-C.  Cresenciano Genre et al. Annamaria Helling. 9476;546(50): 2061-2068  (http://education.[employer].com/faq/FAQ164)    Total CHOL/HDL Ratio 1.4 <5.0 (calc)   Non-HDL Cholesterol (Calc) 29 <130 mg/dL (calc)    Comment: For patients with diabetes plus 1 major ASCVD risk  factor, treating to a non-HDL-C goal of <100 mg/dL  (LDL-C of <70 mg/dL) is considered a therapeutic  option.     PHQ2/9: Depression screen [facility] 2/9 11/01/2021 09/04/2021 07/13/2021 04/11/2021 03/20/2021  Decreased Interest 0 0 0 1 2  Down, Depressed, Hopeless 0 0 0 1 2  PHQ - 2 Score 0 0 0 2 4  Altered sleeping 0 0 0 2  3  Tired, decreased energy 0 0 0 2 2  Change in appetite 0 0 0 0 0  Feeling bad or failure about yourself  0 0 0 0 1  Trouble concentrating 0 0 0 0 0  Moving slowly or fidgety/restless 0 0 0 0 0  Suicidal thoughts 0 0 0 0 0  PHQ-9 Score 0 0 0 6 10  Difficult doing work/chores Not difficult at all Not difficult at all Not difficult at all - -  Some recent data might be hidden    phq 9 is negative   Fall Risk: Fall Risk  11/01/2021 09/04/2021 07/13/2021 04/11/2021 03/20/2021  Falls in the past year? '1 1 1 1 1  '$ Number falls in past yr: 1 1 0 1 1  Injury with Fall? 0 0 0 0 0  Risk for fall due to : Impaired balance/gait - Impaired balance/gait History of fall(s) -  Follow up Falls prevention discussed - Falls prevention discussed Falls prevention discussed -      Functional Status Survey: Is the patient deaf or have difficulty hearing?: No Does the patient have difficulty seeing, even when wearing glasses/contacts?: Yes Does the patient have difficulty concentrating, remembering, or making decisions?: No Does the patient have difficulty walking or climbing stairs?: No Does the patient have difficulty dressing or bathing?: No Does the patient have difficulty doing errands alone such as visiting a doctor's office or shopping?: No    Assessment & Plan  1. Chronic bilateral back pain, unspecified back location  - tiZANidine (ZANAFLEX) 2 MG tablet; Take 1 tablet (2 mg total) by mouth 2 (two) times daily as needed for muscle spasms.  Dispense: 180 tablet; Refill: 0  2. Atherosclerosis of aorta [facility])  Continue medical management   3. Centrilobular emphysema ([place])   4. CAD in native artery

## 2021-11-01 ENCOUNTER — Other Ambulatory Visit: Payer: Self-pay

## 2021-11-01 ENCOUNTER — Ambulatory Visit (INDEPENDENT_AMBULATORY_CARE_PROVIDER_SITE_OTHER): Payer: Medicaid Other | Admitting: Family Medicine

## 2021-11-01 ENCOUNTER — Encounter: Payer: Self-pay | Admitting: Family Medicine

## 2021-11-01 VITALS — BP 116/64 | HR 65 | Temp 97.6°F | Resp 16 | Ht 64.0 in | Wt 121.8 lb

## 2021-11-01 DIAGNOSIS — G8929 Other chronic pain: Secondary | ICD-10-CM | POA: Diagnosis not present

## 2021-11-01 DIAGNOSIS — I700 Atherosclerosis of aorta: Secondary | ICD-10-CM

## 2021-11-01 DIAGNOSIS — J432 Centrilobular emphysema: Secondary | ICD-10-CM

## 2021-11-01 DIAGNOSIS — M549 Dorsalgia, unspecified: Secondary | ICD-10-CM

## 2021-11-01 DIAGNOSIS — I2510 Atherosclerotic heart disease of native coronary artery without angina pectoris: Secondary | ICD-10-CM | POA: Diagnosis not present

## 2021-11-01 MED ORDER — TIZANIDINE HCL 2 MG PO TABS: 2.0000 mg | ORAL_TABLET | Freq: Two times a day (BID) | ORAL | 0 refills | Status: DC | PRN

## 2021-11-01 NOTE — Telephone Encounter (Signed)
Requested medication (s) are due for refill today: yes ? ?Requested medication (s) are on the active medication list: yes   ? ?Last refill: 08/29/21  #20  0 refills ? ?Future visit scheduled Yes 11/01/21 ? ?Notes to clinic:Not delegated ? ?Requested Prescriptions  ?Pending Prescriptions Disp Refills  ? tiZANidine (ZANAFLEX) 2 MG tablet [Pharmacy Med Name: tiZANidine HCl 2 MG Oral Tablet] 20 tablet 0  ?  Sig: TAKE 1 TABLET BY MOUTH EVERY 8 HOURS AS NEEDED FOR MUSCLE SPASM  ?  ? Not Delegated - Cardiovascular:  Alpha-2 Agonists - tizanidine Failed - 10/31/2021  1:07 PM  ?  ?  Failed - This refill cannot be delegated  ?  ?  Passed - Valid encounter within last 6 months  ?  Recent Outpatient Visits   ? ?      ? 1 month ago Weakness  ? [place], DO  ? 3 months ago CAD in native artery  ? [facility], Drue Stager, MD  ? 6 months ago Centrilobular emphysema [facility])  ? [facility], Drue Stager, MD  ? 7 months ago Acute midline low back pain with bilateral sciatica  ? [facility] [place], Drue Stager, MD  ? 9 months ago Centrilobular emphysema [facility])  ? [facility] Steele Sizer, MD  ? ?  ?  ?Future Appointments   ? ?        ? Today Steele Sizer, MD [facility], [place]  ? In 2 months Steele Sizer, MD [facility], [place]  ? ?  ? ?  ?  ?  ? ? ? ? ?

## 2022-01-09 NOTE — Progress Notes (Signed)
Name: James Padilla   MRN: [id number]    DOB: 01/08/1966   Date:01/10/2022       Progress Note  Subjective  Chief Complaint  Follow up   HPI  HTN: taking losartan hctz and Atenolol 25 mg , bp is well controlled today, denies chest pain or palpitation  Cyst on upper back: he noticed it 5 days ago, it is a little sore, no oozing.   Centribular emphysema: he has a morning cough, that is productive ans has some SOB with activity , he has been a smoker since his late teenage years. He smokes half a pack daily, he used to smoke a pack and half a day for many years He uses Trelegy and helps with sob   Chronic neck/cervical myelopathy  and back pain: he has a long history of neuropathy, he has tingling on both arms and some myoclonus. He states has difficulty working due to the pain, waiting for disability, lives in friend's house, sleeps on a sofa. He had neck surgery scheduled for Summer 2020 but cancelled once due to COVID-19 and once due to positive drug screen for crack cocaine and was dismissed from Dr. Cari Caraway. He is still doing drugs He is taking gabapentin one capsule three times a day but still in constant pain and at times sharp pain that radiates from his neck to his sacral area. He was approved for disability and will have Medicare in June   Atherosclerosis of aorta: on statin therapy, denies side effects, Last LDL was at goal. He also has coronary artery disease - discussed symptoms of angina    Cocaine drug use: he has been smoking crack cocaine for about since 1986. He is going to church and trying to quit smoking.   MDD: he takes Depakote but mostly for anger. He does not want to add another medication at this time , he also would not be a good candidate since still using cocaine.  He has been going to church and has been controlling his mood better   Patient Active Problem List   Diagnosis Date Noted   Centrilobular emphysema ([place]) 10/04/2020   Atherosclerosis of aorta ([place])  10/04/2020   CAD in native artery 10/04/2020   Neuropathy 08/29/2020   Essential hypertension 01/04/2019   Crack cocaine use 12/17/2018   Sebaceous cyst 12/17/2018   Nonintractable episodic headache 10/05/2018   Frequency of urination and polyuria 10/05/2018   Irritability and anger 10/05/2018   Homelessness 09/19/2018   Chronic neck pain 09/19/2018   Chronic bilateral back pain 09/19/2018   Right arm pain 08/14/2018    Past Surgical History:  Procedure Laterality Date   COLONOSCOPY WITH PROPOFOL N/A 10/15/2018   Procedure: COLONOSCOPY WITH PROPOFOL;  Surgeon: Jonathon Bellows, MD;  Location: [facility] ENDOSCOPY;  Service: Gastroenterology;  Laterality: N/A;   MULTIPLE TOOTH EXTRACTIONS      Family History  Problem Relation Age of Onset   Breast cancer Mother    Aneurysm Father     Social History   Tobacco Use   Smoking status: Every Day    Packs/day: 0.75    Years: 40.00    Pack years: 30.00    Types: Cigarettes    Start date: 04/12/1982   Smokeless tobacco: Never  Substance Use Topics   Alcohol use: Not Currently    Comment: rarely      Current Outpatient Medications:    atenolol (TENORMIN) 25 MG tablet, Take 1 tablet (25 mg total) by mouth every evening., Disp:  90 tablet, Rfl: 1   atorvastatin (LIPITOR) 20 MG tablet, Take 1 tablet (20 mg total) by mouth daily. For your heart, Disp: 90 tablet, Rfl: 1   divalproex (DEPAKOTE) 250 MG DR tablet, Take 1 tablet (250 mg total) by mouth in the morning and at bedtime. For mood, Disp: 180 tablet, Rfl: 1   EQ ASPIRIN ADULT LOW DOSE 81 MG EC tablet, Take 1 tablet by mouth once daily, Disp: 30 tablet, Rfl: 0   Fluticasone-Umeclidin-Vilant (TRELEGY ELLIPTA) 100-62.5-25 MCG/ACT AEPB, Inhale 1 puff into the lungs daily., Disp: 3 each, Rfl: 1   gabapentin (NEURONTIN) 300 MG capsule, Take 2 capsules (600 mg total) by mouth 3 (three) times daily., Disp: 180 capsule, Rfl: 1   losartan-hydrochlorothiazide (HYZAAR) 50-12.5 MG tablet, Take 1 tablet  by mouth daily. For blood pressure, Disp: 90 tablet, Rfl: 1   tiZANidine (ZANAFLEX) 2 MG tablet, Take 1 tablet (2 mg total) by mouth 2 (two) times daily as needed for muscle spasms., Disp: 180 tablet, Rfl: 0   triamcinolone cream (KENALOG) 0.1 %, Apply 1 application topically 2 (two) times daily., Disp: 30 g, Rfl: 0  Allergies  Allergen Reactions   Septra [Sulfamethoxazole-Trimethoprim] Other (See Comments)    Renal Failure   Lactose Nausea And Vomiting    I personally reviewed active problem list, medication list, allergies, family history, social history with the patient/caregiver today.   ROS  Constitutional: Negative for fever or weight change.  Respiratory: Negative for cough and shortness of breath.   Cardiovascular: Negative for chest pain or palpitations.  Gastrointestinal: Negative for abdominal pain, no bowel changes.  Musculoskeletal: Positive  for gait problem but no  joint swelling.  Skin: Negative for rash.  Neurological: Negative for dizziness or headache.  No other specific complaints in a complete review of systems (except as listed in HPI above).   Objective  Vitals:   01/10/22 0946  BP: 128/68  Pulse: 77  Resp: 16  Temp: 97.9 F (36.6 C)  TempSrc: Oral  SpO2: 98%  Weight: 118 lb 8 oz (53.8 kg)  Height: '5\' 5"'$  (1.651 m)    Body mass index is 19.72 kg/m.  Physical Exam  Constitutional: Patient appears well-developed and malnourished  No distress.  HEENT: head atraumatic, normocephalic, pupils equal and reactive to light, neck supple Cardiovascular: Normal rate, regular rhythm and normal heart sounds.  No murmur heard. No BLE edema. Pulmonary/Chest: Effort normal and breath sounds normal. No respiratory distress. Abdominal: Soft.  There is no tenderness. Skin: cyst on upper back, smooth borders, no redness or oozing , slightly tender  Psychiatric: Patient has a normal mood and affect. behavior is normal. Judgment and thought content normal.    PHQ2/9:    01/10/2022    9:53 AM 11/01/2021   11:23 AM 09/04/2021    3:09 PM 07/13/2021   10:12 AM 04/11/2021   11:34 AM  Depression screen PHQ 2/9  Decreased Interest 2 0 0 0 1  Down, Depressed, Hopeless 3 0 0 0 1  PHQ - 2 Score 5 0 0 0 2  Altered sleeping 3 0 0 0 2  Tired, decreased energy 2 0 0 0 2  Change in appetite 1 0 0 0 0  Feeling bad or failure about yourself  0 0 0 0 0  Trouble concentrating 2 0 0 0 0  Moving slowly or fidgety/restless 0 0 0 0 0  Suicidal thoughts 0 0 0 0 0  PHQ-9 Score 13 0 0 0 6  Difficult doing work/chores Somewhat difficult Not difficult at all Not difficult at all Not difficult at all     phq 9 is positive   Fall Risk:    01/10/2022    9:52 AM 11/01/2021   11:23 AM 09/04/2021    3:06 PM 07/13/2021   10:12 AM 04/11/2021   11:35 AM  Fall Risk   Falls in the past year? '1 1 1 1 1  '$ Number falls in past yr: '1 1 1 '$ 0 1  Injury with Fall? 0 0 0 0 0  Risk for fall due to : History of fall(s) Impaired balance/gait  Impaired balance/gait History of fall(s)  Follow up Falls evaluation completed;Education provided;Falls prevention discussed Falls prevention discussed  Falls prevention discussed Falls prevention discussed     Functional Status Survey: Is the patient deaf or have difficulty hearing?: No Does the patient have difficulty seeing, even when wearing glasses/contacts?: Yes Does the patient have difficulty concentrating, remembering, or making decisions?: No Does the patient have difficulty walking or climbing stairs?: Yes Does the patient have difficulty dressing or bathing?: Yes Does the patient have difficulty doing errands alone such as visiting a doctor's office or shopping?: No    Assessment & Plan  Problem List Items Addressed This Visit     Homelessness    Gave his new number to social worker today  Currently living with a friend        Hypertension, benign   Relevant Medications   atorvastatin (LIPITOR) 20 MG tablet    atenolol (TENORMIN) 25 MG tablet   losartan-hydrochlorothiazide (HYZAAR) 50-12.5 MG tablet   Centrilobular emphysema ([place]) - Primary    Continue Trelegy, he is cutting down on smoking down to half pack daily        Relevant Medications   Fluticasone-Umeclidin-Vilant (TRELEGY ELLIPTA) 100-62.5-25 MCG/ACT AEPB   Atherosclerosis of aorta ([facility])    Continue atorvastatin        Relevant Medications   atorvastatin (LIPITOR) 20 MG tablet   atenolol (TENORMIN) 25 MG tablet   losartan-hydrochlorothiazide (HYZAAR) 50-12.5 MG tablet   Drug abuse, cocaine type (H[facility])    He has been using it for about 40 years        Cervical myelopathy ([facility])    Not a candidate for surgery due to still using cocaine       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[facility])    On Depakote for mood, going to church        Relevant Medications   divalproex (DEPAKOTE) 250 MG DR tablet   CAD in native artery   Relevant Medications   atorvastatin (LIPITOR) 20 MG tablet   atenolol (TENORMIN) 25 MG tablet   losartan-hydrochlorothiazide (HYZAAR) 50-12.5 MG tablet   Sebaceous cyst   Relevant Orders   Ambulatory referral to General Surgery   Irritability and anger   Relevant Medications   divalproex (DEPAKOTE) 250 MG DR tablet   Chronic neck pain   Relevant Medications   divalproex (DEPAKOTE) 250 MG DR tablet   gabapentin (NEURONTIN) 300 MG capsule   tiZANidine (ZANAFLEX) 2 MG tablet   Chronic bilateral back pain   Relevant Medications   divalproex (DEPAKOTE) 250 MG DR tablet   gabapentin (NEURONTIN) 300 MG capsule   tiZANidine (ZANAFLEX) 2 MG tablet

## 2022-01-10 ENCOUNTER — Ambulatory Visit: Payer: Medicaid Other | Admitting: Family Medicine

## 2022-01-10 ENCOUNTER — Encounter: Payer: Self-pay | Admitting: Family Medicine

## 2022-01-10 VITALS — BP 128/68 | HR 77 | Temp 97.9°F | Resp 16 | Ht 65.0 in | Wt 118.5 lb

## 2022-01-10 DIAGNOSIS — F330 Major depressive disorder, recurrent, mild: Secondary | ICD-10-CM

## 2022-01-10 DIAGNOSIS — I700 Atherosclerosis of aorta: Secondary | ICD-10-CM | POA: Diagnosis not present

## 2022-01-10 DIAGNOSIS — J432 Centrilobular emphysema: Secondary | ICD-10-CM | POA: Diagnosis not present

## 2022-01-10 DIAGNOSIS — M542 Cervicalgia: Secondary | ICD-10-CM

## 2022-01-10 DIAGNOSIS — I10 Essential (primary) hypertension: Secondary | ICD-10-CM

## 2022-01-10 DIAGNOSIS — G959 Disease of spinal cord, unspecified: Secondary | ICD-10-CM | POA: Insufficient documentation

## 2022-01-10 DIAGNOSIS — L723 Sebaceous cyst: Secondary | ICD-10-CM

## 2022-01-10 DIAGNOSIS — I2510 Atherosclerotic heart disease of native coronary artery without angina pectoris: Secondary | ICD-10-CM

## 2022-01-10 DIAGNOSIS — G8929 Other chronic pain: Secondary | ICD-10-CM

## 2022-01-10 DIAGNOSIS — F1410 Cocaine abuse, uncomplicated: Secondary | ICD-10-CM | POA: Diagnosis not present

## 2022-01-10 DIAGNOSIS — R454 Irritability and anger: Secondary | ICD-10-CM

## 2022-01-10 DIAGNOSIS — M549 Dorsalgia, unspecified: Secondary | ICD-10-CM

## 2022-01-10 DIAGNOSIS — Z5900 Homelessness unspecified: Secondary | ICD-10-CM

## 2022-01-10 MED ORDER — LOSARTAN POTASSIUM-HCTZ 50-12.5 MG PO TABS: 1.0000 | ORAL_TABLET | Freq: Every day | ORAL | 1 refills | Status: DC

## 2022-01-10 MED ORDER — GABAPENTIN 300 MG PO CAPS: 300.0000 mg | ORAL_CAPSULE | Freq: Three times a day (TID) | ORAL | 1 refills | Status: DC

## 2022-01-10 MED ORDER — DIVALPROEX SODIUM 250 MG PO DR TAB: 250.0000 mg | DELAYED_RELEASE_TABLET | Freq: Two times a day (BID) | ORAL | 1 refills | Status: DC

## 2022-01-10 MED ORDER — ATENOLOL 25 MG PO TABS: 25.0000 mg | ORAL_TABLET | Freq: Every evening | ORAL | 1 refills | Status: DC

## 2022-01-10 MED ORDER — TRELEGY ELLIPTA 100-62.5-25 MCG/ACT IN AEPB: 1.0000 | INHALATION_SPRAY | Freq: Every day | RESPIRATORY_TRACT | 1 refills | Status: DC

## 2022-01-10 MED ORDER — ATORVASTATIN CALCIUM 20 MG PO TABS: 20.0000 mg | ORAL_TABLET | Freq: Every day | ORAL | 1 refills | Status: DC

## 2022-01-10 MED ORDER — TIZANIDINE HCL 2 MG PO TABS: 2.0000 mg | ORAL_TABLET | Freq: Two times a day (BID) | ORAL | 1 refills | Status: DC | PRN

## 2022-01-10 NOTE — Assessment & Plan Note (Signed)
Continue Trelegy, he is cutting down on smoking down to half pack daily

## 2022-01-10 NOTE — Assessment & Plan Note (Signed)
Continue atorvastatin

## 2022-01-10 NOTE — Assessment & Plan Note (Signed)
He has been using it for about 40 years

## 2022-01-10 NOTE — Assessment & Plan Note (Signed)
Not a candidate for surgery due to still using cocaine

## 2022-01-10 NOTE — Assessment & Plan Note (Signed)
On Depakote for mood, going to church

## 2022-01-10 NOTE — Assessment & Plan Note (Signed)
Gave his new number to Education officer, museum today  Currently living with a friend

## 2022-01-15 ENCOUNTER — Ambulatory Visit: Payer: Medicaid Other | Admitting: Surgery

## 2022-01-15 ENCOUNTER — Other Ambulatory Visit: Payer: Self-pay

## 2022-01-15 ENCOUNTER — Encounter: Payer: Self-pay | Admitting: Surgery

## 2022-01-15 VITALS — BP 155/93 | HR 58 | Temp 97.6°F | Ht 64.0 in | Wt 118.4 lb

## 2022-01-15 DIAGNOSIS — L720 Epidermal cyst: Secondary | ICD-10-CM

## 2022-01-15 DIAGNOSIS — L723 Sebaceous cyst: Secondary | ICD-10-CM

## 2022-01-15 NOTE — Patient Instructions (Addendum)
Please see your appointment listed below.  If bleeding starts hold firm pressure on the area for 5 minutes -no peeking.    We have removed a Cyst in our office today.  You have sutures under the skin that will dissolve and also dermabond (skin glue) on top of your skin which will come off on it's own in 10-14 days.  You may shower in 48 hours. Do not scrub over the area.   Avoid Strenuous activities that will make you sweat during the next 48 hours to avoid the glue coming off prematurely. Avoid activities that will place pressure to this area of the body for 1-2 weeks to avoid re-injury to incision site.  Please see your follow-up appointment provided. We will see you back in office to make sure this area is healed and to review the final pathology. If you have any questions or concerns prior to this appointment, call our office and speak with a nurse.    Excision of Skin Cysts or Lesions Excision of a skin lesion refers to the removal of a section of skin by making small cuts (incisions) in the skin. This procedure may be done to remove a cancerous (malignant) or noncancerous (benign) growth on the skin. It is typically done to treat or prevent cancer or infection. It may also be done to improve cosmetic appearance. The procedure may be done to remove: Cancerous growths, such as basal cell carcinoma, squamous cell carcinoma, or melanoma. Noncancerous growths, such as a cyst or lipoma. Growths, such as moles or skin tags, which may be removed for cosmetic reasons.  Various excision or surgical techniques may be used depending on your condition, the location of the lesion, and your overall health. Tell a health care provider about: Any allergies you have. All medicines you are taking, including vitamins, herbs, eye drops, creams, and over-the-counter medicines. Any problems you or family members have had with anesthetic medicines. Any blood disorders you have. Any surgeries you have  had. Any medical conditions you have. Whether you are pregnant or may be pregnant. What are the risks? Generally, this is a safe procedure. However, problems may occur, including: Bleeding. Infection. Scarring. Recurrence of the cyst, lipoma, or cancer. Changes in skin sensation or appearance, such as discoloration or swelling. Reaction to the anesthetics. Allergic reaction to surgical materials or ointments. Damage to nerves, blood vessels, muscles, or other structures. Continued pain.  What happens before the procedure? Ask your health care provider about: Changing or stopping your regular medicines. This is especially important if you are taking diabetes medicines or blood thinners. Taking medicines such as aspirin and ibuprofen. These medicines can thin your blood. Do not take these medicines before your procedure if your health care provider instructs you not to. You may be asked to take certain medicines. You may be asked to stop smoking. You may have an exam or testing. Plan to have someone take you home after the procedure. Plan to have someone help you with activities during recovery. What happens during the procedure? To reduce your risk of infection: Your health care team will wash or sanitize their hands. Your skin will be washed with soap. You will be given a medicine to numb the area (local anesthetic). One of the following excision techniques will be performed. At the end of any of these procedures, antibiotic ointment will be applied as needed. Each of the following techniques may vary among health care providers and hospitals. Complete Surgical Excision The area of skin  that needs to be removed will be marked with a pen. Using a small scalpel or scissors, the surgeon will gently cut around and under the lesion until it is completely removed. The lesion will be placed in a fluid and sent to the lab for examination. If necessary, bleeding will be controlled with a  device that delivers heat (electrocautery). The edges of the wound may be stitched (sutured) together, and a bandage (dressing) will be applied. This procedure may be performed to treat a cancerous growth or a noncancerous cyst or lesion. Excision of a Cyst The surgeon will make an incision on the cyst. The entire cyst will be removed through the incision. The incision may be closed with sutures. Shave Excision During shave excision, the surgeon will use a small blade or an electrically heated loop instrument to shave off the lesion. This may be done to remove a mole or a skin tag. The wound will usually be left to heal on its own without sutures. Punch Excision During punch excision, the surgeon will use a small tool that is like a cookie cutter or a hole punch to cut a circle shape out of the skin. The outer edges of the skin will be sutured together. This may be done to remove a mole or a scar or to perform a biopsy of the lesion. Mohs Micrographic Surgery During Mohs micrographic surgery, layers of the lesion will be removed with a scalpel or a loop instrument and will be examined right away under a microscope. Layers will be removed until all of the abnormal or cancerous tissue has been removed. This procedure is minimally invasive, and it ensures the best cosmetic outcome. It involves the removal of as little normal tissue as possible. Mohs is usually done to treat skin cancer, such as basal cell carcinoma or squamous cell carcinoma, particularly on the face and ears. Depending on the size of the surgical wound, it may be sutured closed. What happens after the procedure? Return to your normal activities as told by your health care provider. Talk with your health care provider to discuss any test results, treatment options, and if necessary, the need for more tests. This information is not intended to replace advice given to you by your health care provider. Make sure you discuss any questions you  have with your health care provider. Document Released: 11/06/2009 Document Revised: 01/18/2016 Document Reviewed: 09/28/2014 Elsevier Interactive Patient Education  [employer].

## 2022-01-15 NOTE — Progress Notes (Signed)
Excision of 2.4 cm upper mid back inflamed epidermal inclusion cyst.  Pre-operative Diagnosis: As above.  Post-operative Diagnosis: same.    Surgeon: Ronny Bacon, M.D., FACS  Anesthesia: Local, 1% lidocaine with epinephrine.  Findings: Ruptured epidermal inclusion cyst, with inflammatory process disrupting the cystic wall.  Estimated Blood Loss: 3 mL         Specimens: Fragments of cystic cyst wall, overlying punctum epidermis and soft tissues, clearly benign discarded.          Complications: none              Procedure Details  The benefits, complications, treatment options, and expected outcomes were discussed with the patient. The risks of bleeding, infection, recurrence of symptoms, failure to resolve symptoms, unanticipated injury,  any of which could require further surgery were reviewed with the patient. The likelihood of improving the patient's symptoms with return to their baseline status is expected.  The patient and/or family concurred with the proposed plan, giving informed consent.  The patient was taken to exam room 9, identified and the procedure verified.    Patient was placed in a prone position, prepped with ChloraPrep and draped in sterile manner.  Local infiltration of anesthetic applied to the dermis the adjacent subcutaneous tissues surrounding the cyst in question in the upper mid back.  Elliptical incision is made along to include the punctum along the skin lines.  Skin and subcutaneous tissues and the most superficial aspect of the cystic wall excised.  Due to a lack of integrity of the remaining portions of the cystic wall it was difficult to determine what portions of cystic wall might remain.  Contents evacuated, the wound inspected for any residual cyst lining.  All was sharply excised that was appreciated.  Hemostasis obtained with hemostats.  The incision was then closed with interrupted vertical mattress 3-0 Ethilon.  Procedure tolerated well.  Sterile dry  dressing applied.      Ronny Bacon M.D., [facility] 01/15/2022 10:45 AM

## 2022-01-24 ENCOUNTER — Ambulatory Visit (INDEPENDENT_AMBULATORY_CARE_PROVIDER_SITE_OTHER): Payer: Medicare Other | Admitting: Surgery

## 2022-01-24 ENCOUNTER — Encounter: Payer: Self-pay | Admitting: Surgery

## 2022-01-24 VITALS — BP 160/98 | HR 83 | Temp 98.1°F | Ht 64.0 in | Wt 119.8 lb

## 2022-01-24 DIAGNOSIS — L723 Sebaceous cyst: Secondary | ICD-10-CM

## 2022-01-24 DIAGNOSIS — L720 Epidermal cyst: Secondary | ICD-10-CM

## 2022-01-24 NOTE — Progress Notes (Signed)
[facility] POST-OP OFFICE VISIT  01/24/2022  HPI: James Padilla is a 56 y.o. male s/p excision of inflamed epidermal inclusion cyst from upper mid back.  Minimal drainage, currently itching.  Denies fevers and chills.  Vital signs: BP (!) 160/98   Pulse 83   Temp 98.1 F (36.7 C)   Ht '5\' 4"'$  (1.626 m)   Wt 119 lb 12.8 oz (54.3 kg)   SpO2 98%   BMI 20.56 kg/m    Physical Exam: Constitutional: Appears well, wearing back brace today.  Skin: Slightly hypertrophic scarring.  Nylon sutures removed today.  No evidence of drainage, erythema or induration.  Assessment/Plan: This is a 56 y.o. male s/p excision of inflamed epidermal inclusion cyst upper mid back.  Doing well.  Patient Active Problem List   Diagnosis Date Noted   Drug abuse, cocaine type ([place]) 01/10/2022   Cervical myelopathy ([place]) 01/10/2022   Mild episode of recurrent major depressive disorder ([place]) 01/10/2022   Centrilobular emphysema ([place]) 10/04/2020   Atherosclerosis of aorta ([place]) 10/04/2020   CAD in native artery 10/04/2020   Neuropathy 08/29/2020   Hypertension, benign 01/04/2019   Crack cocaine use 12/17/2018   Sebaceous cyst 12/17/2018   Nonintractable episodic headache 10/05/2018   Irritability and anger 10/05/2018   Homelessness 09/19/2018   Chronic neck pain 09/19/2018   Chronic bilateral back pain 09/19/2018   Right arm pain 08/14/2018    -Follow-up as needed.   Ronny Bacon M.D., FACS 01/24/2022, 9:15 AM

## 2022-01-24 NOTE — Patient Instructions (Signed)
We removed your stitches today. You may remove the Band-Aid tomorrow. No need to cover the area.   Follow-up with our office as needed.  Please call and ask to speak with a nurse if you develop questions or concerns.

## 2022-02-24 ENCOUNTER — Other Ambulatory Visit: Payer: Self-pay

## 2022-02-24 ENCOUNTER — Encounter: Payer: Self-pay | Admitting: Emergency Medicine

## 2022-02-24 ENCOUNTER — Emergency Department
Admission: EM | Admit: 2022-02-24 | Discharge: 2022-02-24 | Disposition: A | Discharge status: Home / Self Care | Payer: Medicare Other | Attending: Emergency Medicine | Admitting: Emergency Medicine

## 2022-02-24 ENCOUNTER — Emergency Department: Payer: Medicare Other

## 2022-02-24 DIAGNOSIS — S63502A Unspecified sprain of left wrist, initial encounter: Secondary | ICD-10-CM | POA: Diagnosis not present

## 2022-02-24 DIAGNOSIS — Y9301 Activity, walking, marching and hiking: Secondary | ICD-10-CM | POA: Insufficient documentation

## 2022-02-24 DIAGNOSIS — W1839XA Other fall on same level, initial encounter: Secondary | ICD-10-CM | POA: Insufficient documentation

## 2022-02-24 DIAGNOSIS — S6992XA Unspecified injury of left wrist, hand and finger(s), initial encounter: Secondary | ICD-10-CM | POA: Diagnosis present

## 2022-02-24 MED ORDER — NAPROXEN 500 MG PO TABS: 500.0000 mg | ORAL_TABLET | Freq: Two times a day (BID) | ORAL | 0 refills | Status: AC

## 2022-02-24 MED ORDER — NAPROXEN 500 MG PO TABS
500.0000 mg | ORAL_TABLET | Freq: Once | ORAL | Status: AC
  Administered 2022-02-24: 500 mg via ORAL
  Filled 2022-02-24: qty 1

## 2022-02-24 NOTE — ED Provider Notes (Signed)
[facility] Provider Note    None    (approximate)   History   Chief Complaint Hand Injury   HPI James Padilla is a 56 y.o. male, history of homelessness, crack cocaine use, CAD, c hypertension, centrilobular emphysema, depression, presents to the emergency department for evaluation of head/hip injury.  Patient states that he was walking this morning when he walked past some people walking their dogs.  The dog scared him and when he went to step backwards, he fell hurting his left hip and left wrist.  He states that the pain in his hip has resolved and does not want any work-up, however he states that his left wrist is still hurting.  Denies cold sensation in the affected extremity, numbness/tingling, head injury, chest pain, abdominal pain, dizziness/lightheadedness, or injury to the fingers/hand/forearm/elbow.  History Limitations: No limitations        Physical Exam  Triage Vital Signs: ED Triage Vitals  Enc Vitals Group     BP --      Pulse Rate 02/24/22 0855 67     Resp 02/24/22 0855 20     Temp 02/24/22 0855 97.8 F (36.6 C)     Temp Source 02/24/22 0855 Oral     SpO2 02/24/22 0855 98 %     Weight 02/24/22 0853 119 lb 11.4 oz (54.3 kg)     Height 02/24/22 0853 '5\' 4"'$  (1.626 m)     Head Circumference --      Peak Flow --      Pain Score 02/24/22 0853 10     Pain Loc --      Pain Edu? --      Excl. in [place]? --     Most recent vital signs: Vitals:   02/24/22 0855  Pulse: 67  Resp: 20  Temp: 97.8 F (36.6 C)  SpO2: 98%    General: Awake, appears uncomfortable.  Consistent requesting for pain medication.   Skin: Warm, dry. No rashes or lesions.  Eyes: PERRL. Conjunctivae normal.  CV: Good peripheral perfusion.  Resp: Normal effort.  Abd: Soft, non-tender. No distention.  Neuro: At baseline. No gross neurological deficits.   Focused Exam: Mild dorsal soft tissue swelling in the left wrist.  No gross deformities.  Patient maintains  normal range of motion.  PMS intact distally.  Normal flexion/extension at the wrist joint.  Normal radial/ulnar deviation.  No snuffbox tenderness.  Physical Exam    ED Results / Procedures / Treatments  Labs (all labs ordered are listed, but only abnormal results are displayed) Labs Reviewed - No data to display   EKG N/A.   RADIOLOGY  ED Provider Interpretation: I personally viewed and interpreted this image, no evidence of acute fractures or dislocations  DG Wrist Complete Left  Result Date: 02/24/2022 CLINICAL DATA:  Fall.  Wrist pain. EXAM: LEFT WRIST - COMPLETE 3+ VIEW COMPARISON:  None Available. FINDINGS: The mineralization and alignment are normal. There is no evidence of acute fracture or dislocation. There are degenerative changes at the 1st through 3rd metacarpal phalangeal joints. The intercarpal joint spaces are preserved. Dorsal soft tissue swelling is noted at the wrist. IMPRESSION: Dorsal soft tissue swelling without evidence of acute fracture or dislocation. Electronically Signed   By: Richardean Sale M.D.   On: 02/24/2022 09:49    PROCEDURES:  Critical Care performed: N/A.  Procedures    MEDICATIONS ORDERED IN ED: Medications  naproxen (NAPROSYN) tablet 500 mg (500 mg Oral Given 02/24/22  1024)     IMPRESSION / MDM / ASSESSMENT AND PLAN / ED COURSE  I reviewed the triage vital signs and the nursing notes.                              Differential diagnosis includes, but is not limited to, sprain, distal radius fracture, distal ulna fracture, carpal dislocation, scaphoid fracture.  ED Course Patient appears well, vitals are within normal limits.  We will go ahead treat with naproxen and ice pack.  Assessment/Plan Presentation consistent with sprain.  Physical exam shows mild soft tissue swelling, but otherwise no significant findings.  No snuffbox tenderness.  X-ray reassuring for no fractures or dislocations.  Provide patient with wrist brace and a  prescription for naproxen additionally provided him with a referral to orthopedics if his pain fails to improve after 1 week.  We will plan to discharge.  Patient's presentation is most consistent with acute complicated illness / injury requiring diagnostic workup.   Provided the patient with anticipatory guidance, return precautions, and educational material. Encouraged the patient to return to the emergency department at any time if they begin to experience any new or worsening symptoms. Patient expressed understanding and agreed with the plan.       FINAL CLINICAL IMPRESSION(S) / ED DIAGNOSES   Final diagnoses:  Sprain of left wrist, initial encounter     Rx / DC Orders   ED Discharge Orders          Ordered    naproxen (NAPROSYN) 500 MG tablet  2 times daily with meals        02/24/22 1031             Note:  This document was prepared using Dragon voice recognition software and may include unintentional dictation errors.   Teodoro Spray, [place] 02/24/22 1038    Nena Polio, MD 02/24/22 [phone]

## 2022-02-24 NOTE — ED Triage Notes (Signed)
Pt reports was walking this morning and walked past some people walking their dogs and the dogs scared him and he went to step backwards and fell hurting his left hip and left wrist. Pt reports painful to walk on but states most discomfort is in his left hand/wrist.  Denies hitting head or LOC

## 2022-02-24 NOTE — Discharge Instructions (Addendum)
-  You may take acetaminophen and naproxen as needed for pain.  -Rest and ice the affected area for the next 2 to 3 days.  Avoid lifting anything heavier than a coffee cup for the next few weeks with that hand.  Recommend keeping the brace on during this time to prevent further injury.  -If your symptoms fail to improve after 1 week, you may schedule an appointment with the orthopedist listed in these instructions.  -Return to the emergency department anytime if you begin to experience any new or worsening symptoms.

## 2022-02-28 ENCOUNTER — Ambulatory Visit: Payer: Self-pay | Admitting: *Deleted

## 2022-02-28 NOTE — Telephone Encounter (Signed)
  Chief Complaint: Wrist injury Symptoms: Pt fell Saturday, went to ED, left wrist XRay neg for fx. Swelling remains, pain 7-8/10. Pt is icing, elevating. Naproxen helping pain Frequency: Sat Pertinent Negatives: Patient denies Numbness, tingling, lacerations Disposition: '[]'$ ED /'[]'$ Urgent Care (no appt availability in office) / '[x]'$ Appointment(In office/virtual)/ '[]'$  [facility]/ '[]'$ Home Care/ '[]'$ Refused Recommended Disposition /'[]'$ [place] [facility]/ '[]'$  Follow-up with PCP Additional Notes: Pt had called to confirm appt tomorrow. During call mentioned fall, transferred to NT. Advised to ice, elevate per protocol care advise. Advised to keep appt tomorrow. Pt verbalizes understanding. Reason for Disposition  [1] Fall AND [2] went to emergency department for evaluation or treatment  Answer Assessment - Initial Assessment Questions 1. MECHANISM: "How did the fall happen?"     Dog ran towards him, turned quickly and fell 2. DOMESTIC VIOLENCE AND ELDER ABUSE SCREENING: "Did you fall because someone pushed you or tried to hurt you?" If Yes, ask: "Are you safe now?"     no 3. ONSET: "When did the fall happen?" (e.g., minutes, hours, or days ago)     Saturday 4. LOCATION: "What part of the body hit the ground?" (e.g., back, buttocks, head, hips, knees, hands, head, stomach)     Wrist left 5. INJURY: "Did you hurt (injure) yourself when you fell?" If Yes, ask: "What did you injure? Tell me more about this?" (e.g., body area; type of injury; pain severity)"     Left wrist swollen 6. PAIN: "Is there any pain?" If Yes, ask: "How bad is the pain?" (e.g., Scale 1-10; or mild,  moderate, severe)   - NONE (0): No pain   - MILD (1-3): Doesn't interfere with normal activities    - MODERATE (4-7): Interferes with normal activities or awakens from sleep    - SEVERE (8-10): Excruciating pain, unable to do any normal activities      7-8/10 7. SIZE: For cuts, bruises, or swelling, ask: "How large is  it?" (e.g., inches or centimeters)      NA 9. OTHER SYMPTOMS: "Do you have any other symptoms?" (e.g., dizziness, fever, weakness; new onset or worsening).      no 10. CAUSE: "What do you think caused the fall (or falling)?" (e.g., tripped, dizzy spell)       A dog  Protocols used: Falls and [facility]

## 2022-02-28 NOTE — Progress Notes (Signed)
Name: James Padilla   MRN: [id number]    DOB: April 27, 1966   Date:03/01/2022       Progress Note  Subjective  Chief Complaint  Follow Up  HPI  HTN: taking losartan hctz and Atenolol 25 mg , bp is well controlled today, denies chest pain or palpitation. He states he was not compliant with medications but resumed it daily yesterday   Centribular emphysema: he has a morning cough, that is productive ans has some SOB with activity , he has been a smoker since his late teenage years. He smokes half a pack daily, he used to smoke a pack and half a day for many years He resumed Trelegy yesterday  Chronic neck/cervical myelopathy  and back pain: he has a long history of neuropathy, he has tingling on both arms and some myoclonus. He states has difficulty working due to the pain, waiting for disability, lives in friend's house, sleeps on a sofa. He had neck surgery scheduled for Summer 2020 but cancelled once due to COVID-19 and once due to positive drug screen for crack cocaine and was dismissed from Dr. Cari Caraway. He is still doing drugs He is taking gabapentin one capsule three times a day but still in constant pain and at times sharp pain that radiates from his neck to his sacral area. He was approved for disability and will have Medicare in June 2022. He told me today he will quit taking drugs   Atherosclerosis of aorta: on statin therapy, denies side effects, Last LDL was at goal. He also has coronary artery disease - discussed symptoms of angina  He had stopped all medications but resumed yesterday   Cocaine drug use: he has been smoking crack cocaine since 1986. He is going to church and trying to quit smoking.  He will quit all drugs   MDD: he takes Depakote but mostly for anger. He does not want to add another medication at this time , he also would not be a good candidate since still using cocaine.  He has been going to church and has been controlling his mood better He states he is changing his  life, wants to find his own place.   Sprain of left wrist : he fell on July 1st, went to [facility] and x-ray negative, he is wearing a brace, he states pain is improving a little but he wants to see Ortho   Patient Active Problem List   Diagnosis Date Noted   Drug abuse, cocaine type ([place]) 01/10/2022   Cervical myelopathy ([place]) 01/10/2022   Mild episode of recurrent major depressive disorder ([place]) 01/10/2022   Centrilobular emphysema ([place]) 10/04/2020   Atherosclerosis of aorta ([place]) 10/04/2020   CAD in native artery 10/04/2020   Neuropathy 08/29/2020   Hypertension, benign 01/04/2019   Crack cocaine use 12/17/2018   Sebaceous cyst 12/17/2018   Nonintractable episodic headache 10/05/2018   Irritability and anger 10/05/2018   Homelessness 09/19/2018   Chronic neck pain 09/19/2018   Chronic bilateral back pain 09/19/2018   Right arm pain 08/14/2018    Past Surgical History:  Procedure Laterality Date   COLONOSCOPY WITH PROPOFOL N/A 10/15/2018   Procedure: COLONOSCOPY WITH PROPOFOL;  Surgeon: Jonathon Bellows, MD;  Location: [facility] ENDOSCOPY;  Service: Gastroenterology;  Laterality: N/A;   MULTIPLE TOOTH EXTRACTIONS      Family History  Problem Relation Age of Onset   Breast cancer Mother    Aneurysm Father     Social History   Tobacco Use  Smoking status: Every Day    Packs/day: 0.75    Years: 40.00    Total pack years: 30.00    Types: Cigarettes    Start date: 04/12/1982    Passive exposure: Past   Smokeless tobacco: Never  Substance Use Topics   Alcohol use: Yes    Comment: rarely      Current Outpatient Medications:    atenolol (TENORMIN) 25 MG tablet, Take 1 tablet (25 mg total) by mouth every evening., Disp: 90 tablet, Rfl: 1   atorvastatin (LIPITOR) 20 MG tablet, Take 1 tablet (20 mg total) by mouth daily. For your heart, Disp: 90 tablet, Rfl: 1   divalproex (DEPAKOTE) 250 MG DR tablet, Take 1 tablet (250 mg total) by mouth in the morning and at bedtime. For mood, Disp:  180 tablet, Rfl: 1   EQ ASPIRIN ADULT LOW DOSE 81 MG EC tablet, Take 1 tablet by mouth once daily, Disp: 30 tablet, Rfl: 0   Fluticasone-Umeclidin-Vilant (TRELEGY ELLIPTA) 100-62.5-25 MCG/ACT AEPB, Inhale 1 puff into the lungs daily., Disp: 3 each, Rfl: 1   gabapentin (NEURONTIN) 300 MG capsule, Take 1 capsule (300 mg total) by mouth 3 (three) times daily., Disp: 270 capsule, Rfl: 1   losartan-hydrochlorothiazide (HYZAAR) 50-12.5 MG tablet, Take 1 tablet by mouth daily. For blood pressure, Disp: 90 tablet, Rfl: 1   naproxen (NAPROSYN) 500 MG tablet, Take 1 tablet (500 mg total) by mouth 2 (two) times daily with a meal., Disp: 60 tablet, Rfl: 0   tiZANidine (ZANAFLEX) 2 MG tablet, Take 1 tablet (2 mg total) by mouth 2 (two) times daily as needed for muscle spasms., Disp: 180 tablet, Rfl: 1   triamcinolone cream (KENALOG) 0.1 %, Apply 1 application topically 2 (two) times daily., Disp: 30 g, Rfl: 0  Allergies  Allergen Reactions   Septra [Sulfamethoxazole-Trimethoprim] Other (See Comments)    Renal Failure   Lactose Nausea And Vomiting    I personally reviewed active problem list, medication list, allergies, family history, social history, health maintenance with the patient/caregiver today.   ROS  Constitutional: Negative for fever or weight change.  Respiratory: positive for for cough and shortness of breath.   Cardiovascular: Negative for chest pain or palpitations.  Gastrointestinal: Negative for abdominal pain, no bowel changes.  Musculoskeletal: Negative for gait problem he has pain and swelling of left wrist due to recent fall  Skin: Negative for rash.  Neurological: Negative for dizziness or headache.  No other specific complaints in a complete review of systems (except as listed in HPI above).   Objective  Vitals:   03/01/22 1524  BP: 132/74  Pulse: 62  Resp: 12  Temp: 97.6 F (36.4 C)  TempSrc: Oral  SpO2: 99%  Weight: 124 lb 8 oz (56.5 kg)  Height: '5\' 5"'$  (1.651 m)     Body mass index is 20.72 kg/m.  Physical Exam  Constitutional: Patient appears well-developed and well-nourished.  No distress.  HEENT: head atraumatic, normocephalic, pupils equal and reactive to light, neck supple Cardiovascular: Normal rate, regular rhythm and normal heart sounds.  No murmur heard. No BLE edema. Pulmonary/Chest: Effort normal and breath sounds normal. No respiratory distress. Abdominal: Soft.  There is no tenderness. Muscular skeletal: wearing a brace on left wrist  Psychiatric: Agitated today, confrontational but optimist about the future at the same time   PHQ2/9:    03/01/2022    3:31 PM 01/10/2022    9:53 AM 11/01/2021   11:23 AM 09/04/2021    3:09  PM 07/13/2021   10:12 AM  Depression screen PHQ 2/9  Decreased Interest 2 2 0 0 0  Down, Depressed, Hopeless 0 3 0 0 0  PHQ - 2 Score 2 5 0 0 0  Altered sleeping 1 3 0 0 0  Tired, decreased energy 1 2 0 0 0  Change in appetite 1 1 0 0 0  Feeling bad or failure about yourself  0 0 0 0 0  Trouble concentrating 1 2 0 0 0  Moving slowly or fidgety/restless 0 0 0 0 0  Suicidal thoughts 0 0 0 0 0  PHQ-9 Score 6 13 0 0 0  Difficult doing work/chores Not difficult at all Somewhat difficult Not difficult at all Not difficult at all Not difficult at all    phq 9 is positive   Fall Risk:    03/01/2022    3:31 PM 01/15/2022   10:04 AM 01/10/2022    9:52 AM 11/01/2021   11:23 AM 09/04/2021    3:06 PM  Fall Risk   Falls in the past year? 1 0 '1 1 1  '$ Number falls in past yr: '1  1 1 1  '$ Injury with Fall? 1  0 0 0  Risk for fall due to : History of fall(s)  History of fall(s) Impaired balance/gait   Follow up Education provided;Falls prevention discussed;Falls evaluation completed  Falls evaluation completed;Education provided;Falls prevention discussed Falls prevention discussed       Functional Status Survey: Is the patient deaf or have difficulty hearing?: No Does the patient have difficulty seeing, even when  wearing glasses/contacts?: Yes Does the patient have difficulty concentrating, remembering, or making decisions?: No Does the patient have difficulty walking or climbing stairs?: Yes Does the patient have difficulty dressing or bathing?: Yes Does the patient have difficulty doing errands alone such as visiting a doctor's office or shopping?: No    Assessment & Plan  1. Drug abuse, cocaine type [facility])  He states ready to quit   2. Centrilobular emphysema ([place])  Not using Trelegy as recommended  3. Cervical myelopathy ([place])   4. Mild episode of recurrent major depressive disorder ([place])  He is taking Depakote Possible mood disorder, he seemed agitated, pressured speech, slightly confrontational today, refuses changing medications  5. Atherosclerosis of aorta ([place])  On statin therapy   6. Hypertension, benign  Bp is at goal   7. Decreased vision in both eyes  - Ambulatory referral to Ophthalmology  8. Sprain of left wrist, subsequent encounter  - Ambulatory referral to Orthopedic Surgery

## 2022-03-01 ENCOUNTER — Encounter: Payer: Self-pay | Admitting: Family Medicine

## 2022-03-01 ENCOUNTER — Ambulatory Visit (INDEPENDENT_AMBULATORY_CARE_PROVIDER_SITE_OTHER): Payer: Medicare Other | Admitting: Family Medicine

## 2022-03-01 VITALS — BP 132/74 | HR 62 | Temp 97.6°F | Resp 12 | Ht 65.0 in | Wt 124.5 lb

## 2022-03-01 DIAGNOSIS — F330 Major depressive disorder, recurrent, mild: Secondary | ICD-10-CM

## 2022-03-01 DIAGNOSIS — J432 Centrilobular emphysema: Secondary | ICD-10-CM | POA: Diagnosis not present

## 2022-03-01 DIAGNOSIS — F1410 Cocaine abuse, uncomplicated: Secondary | ICD-10-CM | POA: Diagnosis not present

## 2022-03-01 DIAGNOSIS — I700 Atherosclerosis of aorta: Secondary | ICD-10-CM

## 2022-03-01 DIAGNOSIS — G959 Disease of spinal cord, unspecified: Secondary | ICD-10-CM | POA: Diagnosis not present

## 2022-03-01 DIAGNOSIS — S63502D Unspecified sprain of left wrist, subsequent encounter: Secondary | ICD-10-CM

## 2022-03-01 DIAGNOSIS — H543 Unqualified visual loss, both eyes: Secondary | ICD-10-CM

## 2022-03-01 DIAGNOSIS — I10 Essential (primary) hypertension: Secondary | ICD-10-CM

## 2022-06-03 NOTE — Progress Notes (Unsigned)
Name: James Padilla   MRN: [id number]    DOB: 1966-02-21   Date:06/03/2022       Progress Note  Subjective  Chief Complaint  Follow Up  HPI  HTN: taking losartan hctz and Atenolol 25 mg , bp is well controlled today, denies chest pain or palpitation. He states he was not compliant with medications but resumed it daily yesterday   Centribular emphysema: he has a morning cough, that is productive ans has some SOB with activity , he has been a smoker since his late teenage years. He smokes half a pack daily, he used to smoke a pack and half a day for many years He resumed Trelegy yesterday  Chronic neck/cervical myelopathy  and back pain: he has a long history of neuropathy, he has tingling on both arms and some myoclonus. He states has difficulty working due to the pain, waiting for disability, lives in friend's house, sleeps on a sofa. He had neck surgery scheduled for Summer 2020 but cancelled once due to COVID-19 and once due to positive drug screen for crack cocaine and was dismissed from Dr. Cari Caraway. He is still doing drugs He is taking gabapentin one capsule three times a day but still in constant pain and at times sharp pain that radiates from his neck to his sacral area. He was approved for disability and will have Medicare in June 2022. He told me today he will quit taking drugs   Atherosclerosis of aorta: on statin therapy, denies side effects, Last LDL was at goal. He also has coronary artery disease - discussed symptoms of angina  He had stopped all medications but resumed yesterday   Cocaine drug use: he has been smoking crack cocaine since 1986. He is going to church and trying to quit smoking.  He will quit all drugs   MDD: he takes Depakote but mostly for anger. He does not want to add another medication at this time , he also would not be a good candidate since still using cocaine.  He has been going to church and has been controlling his mood better He states he is changing his  life, wants to find his own place.   Sprain of left wrist : he fell on July 1st, went to [facility] and x-ray negative, he is wearing a brace, he states pain is improving a little but he wants to see Ortho   Patient Active Problem List   Diagnosis Date Noted   Drug abuse, cocaine type ([place]) 01/10/2022   Cervical myelopathy ([place]) 01/10/2022   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) 01/10/2022   Centrilobular emphysema ([place]) 10/04/2020   Atherosclerosis of aorta ([place]) 10/04/2020   CAD in native artery 10/04/2020   Neuropathy 08/29/2020   Hypertension, benign 01/04/2019   Crack cocaine use 12/17/2018   Sebaceous cyst 12/17/2018   Nonintractable episodic headache 10/05/2018   Irritability and anger 10/05/2018   Homelessness 09/19/2018   Chronic neck pain 09/19/2018   Chronic bilateral back pain 09/19/2018   Right arm pain 08/14/2018    Past Surgical History:  Procedure Laterality Date   COLONOSCOPY WITH PROPOFOL N/A 10/15/2018   Procedure: COLONOSCOPY WITH PROPOFOL;  Surgeon: Jonathon Bellows, MD;  Location: [facility] ENDOSCOPY;  Service: Gastroenterology;  Laterality: N/A;   MULTIPLE TOOTH EXTRACTIONS      Family History  Problem Relation Age of Onset   Breast cancer Mother    Aneurysm Father     Social History   Tobacco Use  Smoking status: Every Day    Packs/day: 0.75    Years: 40.00    Total pack years: 30.00    Types: Cigarettes    Start date: 04/12/1982    Passive exposure: Past   Smokeless tobacco: Never  Substance Use Topics   Alcohol use: Yes    Comment: rarely      Current Outpatient Medications:    atenolol (TENORMIN) 25 MG tablet, Take 1 tablet (25 mg total) by mouth every evening., Disp: 90 tablet, Rfl: 1   atorvastatin (LIPITOR) 20 MG tablet, Take 1 tablet (20 mg total) by mouth daily. For your heart, Disp: 90 tablet, Rfl: 1   divalproex (DEPAKOTE) 250 MG DR tablet, Take 1 tablet (250 mg total) by mouth in the morning and at bedtime. For mood, Disp:  180 tablet, Rfl: 1   EQ ASPIRIN ADULT LOW DOSE 81 MG EC tablet, Take 1 tablet by mouth once daily, Disp: 30 tablet, Rfl: 0   Fluticasone-Umeclidin-Vilant (TRELEGY ELLIPTA) 100-62.5-25 MCG/ACT AEPB, Inhale 1 puff into the lungs daily., Disp: 3 each, Rfl: 1   gabapentin (NEURONTIN) 300 MG capsule, Take 1 capsule (300 mg total) by mouth 3 (three) times daily., Disp: 270 capsule, Rfl: 1   losartan-hydrochlorothiazide (HYZAAR) 50-12.5 MG tablet, Take 1 tablet by mouth daily. For blood pressure, Disp: 90 tablet, Rfl: 1   tiZANidine (ZANAFLEX) 2 MG tablet, Take 1 tablet (2 mg total) by mouth 2 (two) times daily as needed for muscle spasms., Disp: 180 tablet, Rfl: 1   triamcinolone cream (KENALOG) 0.1 %, Apply 1 application topically 2 (two) times daily., Disp: 30 g, Rfl: 0  Allergies  Allergen Reactions   Septra [Sulfamethoxazole-Trimethoprim] Other (See Comments)    Renal Failure   Lactose Nausea And Vomiting    I personally reviewed active problem list, medication list, allergies, family history, social history, health maintenance with the patient/caregiver today.   ROS  ***  Objective  There were no vitals filed for this visit.  There is no height or weight on file to calculate BMI.  Physical Exam ***  No results found for this or any previous visit (from the past 2160 hour(s)).   PHQ2/9:    03/01/2022    3:31 PM 01/10/2022    9:53 AM 11/01/2021   11:23 AM 09/04/2021    3:09 PM 07/13/2021   10:12 AM  Depression screen PHQ 2/9  Decreased Interest 2 2 0 0 0  Down, Depressed, Hopeless 0 3 0 0 0  PHQ - 2 Score 2 5 0 0 0  Altered sleeping 1 3 0 0 0  Tired, decreased energy 1 2 0 0 0  Change in appetite 1 1 0 0 0  Feeling bad or failure about yourself  0 0 0 0 0  Trouble concentrating 1 2 0 0 0  Moving slowly or fidgety/restless 0 0 0 0 0  Suicidal thoughts 0 0 0 0 0  PHQ-9 Score 6 13 0 0 0  Difficult doing work/chores Not difficult at all Somewhat difficult Not difficult at all  Not difficult at all Not difficult at all    phq 9 is {gen pos [id number]}   Fall Risk:    03/01/2022    3:31 PM 01/15/2022   10:04 AM 01/10/2022    9:52 AM 11/01/2021   11:23 AM 09/04/2021    3:06 PM  Fall Risk   Falls in the past year? 1 0 '1 1 1  '$ Number falls in past yr: 1  $'1 1 1  'h$ Injury with Fall? 1  0 0 0  Risk for fall due to : History of fall(s)  History of fall(s) Impaired balance/gait   Follow up Education provided;Falls prevention discussed;Falls evaluation completed  Falls evaluation completed;Education provided;Falls prevention discussed Falls prevention discussed       Functional Status Survey:      Assessment & Plan  *** There are no diagnoses linked to this encounter.

## 2022-06-04 ENCOUNTER — Encounter: Payer: Self-pay | Admitting: Family Medicine

## 2022-06-04 ENCOUNTER — Ambulatory Visit (INDEPENDENT_AMBULATORY_CARE_PROVIDER_SITE_OTHER): Payer: Medicare Other | Admitting: Family Medicine

## 2022-06-04 VITALS — BP 132/70 | HR 86 | Resp 16 | Ht 65.0 in | Wt 123.0 lb

## 2022-06-04 DIAGNOSIS — R454 Irritability and anger: Secondary | ICD-10-CM

## 2022-06-04 DIAGNOSIS — I700 Atherosclerosis of aorta: Secondary | ICD-10-CM

## 2022-06-04 DIAGNOSIS — F330 Major depressive disorder, recurrent, mild: Secondary | ICD-10-CM

## 2022-06-04 DIAGNOSIS — J432 Centrilobular emphysema: Secondary | ICD-10-CM

## 2022-06-04 DIAGNOSIS — G8929 Other chronic pain: Secondary | ICD-10-CM

## 2022-06-04 DIAGNOSIS — F1410 Cocaine abuse, uncomplicated: Secondary | ICD-10-CM | POA: Diagnosis not present

## 2022-06-04 DIAGNOSIS — M549 Dorsalgia, unspecified: Secondary | ICD-10-CM

## 2022-06-04 DIAGNOSIS — M542 Cervicalgia: Secondary | ICD-10-CM

## 2022-06-04 DIAGNOSIS — I2510 Atherosclerotic heart disease of native coronary artery without angina pectoris: Secondary | ICD-10-CM

## 2022-06-04 DIAGNOSIS — I10 Essential (primary) hypertension: Secondary | ICD-10-CM

## 2022-06-04 MED ORDER — LOSARTAN POTASSIUM-HCTZ 50-12.5 MG PO TABS: 1.0000 | ORAL_TABLET | Freq: Every day | ORAL | 1 refills | Status: DC

## 2022-06-04 MED ORDER — TRELEGY ELLIPTA 100-62.5-25 MCG/ACT IN AEPB: 1.0000 | INHALATION_SPRAY | Freq: Every day | RESPIRATORY_TRACT | 1 refills | Status: DC

## 2022-06-04 MED ORDER — DIVALPROEX SODIUM 250 MG PO DR TAB: 250.0000 mg | DELAYED_RELEASE_TABLET | Freq: Two times a day (BID) | ORAL | 1 refills | Status: DC

## 2022-06-04 MED ORDER — ATENOLOL 25 MG PO TABS: 25.0000 mg | ORAL_TABLET | Freq: Every evening | ORAL | 1 refills | Status: DC

## 2022-06-04 MED ORDER — ATORVASTATIN CALCIUM 10 MG PO TABS: 10.0000 mg | ORAL_TABLET | Freq: Every day | ORAL | 1 refills | Status: DC

## 2022-06-04 MED ORDER — TIZANIDINE HCL 2 MG PO TABS: 2.0000 mg | ORAL_TABLET | Freq: Two times a day (BID) | ORAL | 1 refills | Status: DC | PRN

## 2022-08-05 ENCOUNTER — Ambulatory Visit
Admission: RE | Admit: 2022-08-05 | Discharge: 2022-08-05 | Disposition: A | Discharge status: Home / Self Care | Payer: Medicare Other | Source: Ambulatory Visit | Attending: Internal Medicine | Admitting: Internal Medicine

## 2022-08-05 ENCOUNTER — Ambulatory Visit (INDEPENDENT_AMBULATORY_CARE_PROVIDER_SITE_OTHER): Payer: Medicare Other | Admitting: Internal Medicine

## 2022-08-05 ENCOUNTER — Encounter: Payer: Self-pay | Admitting: Internal Medicine

## 2022-08-05 ENCOUNTER — Ambulatory Visit: Payer: Self-pay

## 2022-08-05 VITALS — BP 150/102 | HR 89 | Temp 97.8°F | Resp 18 | Ht 65.0 in | Wt 134.3 lb

## 2022-08-05 DIAGNOSIS — M7989 Other specified soft tissue disorders: Secondary | ICD-10-CM | POA: Diagnosis not present

## 2022-08-05 NOTE — Progress Notes (Signed)
   Acute Office Visit  Subjective:     Patient ID: James Padilla, male    DOB: February 02, 1966, 56 y.o.   MRN: [id number]  Chief Complaint  Patient presents with   Edema    HPI Patient is in today for swollen feet and ankles. Swelling started a few days ago, present in both feet and ankles but worse in the left. Has accompanying pain in the ankle and foot. Denies skin changes, joint pains or trauma. Denies shortness of breath or chest pain.    Review of Systems  Constitutional:  Negative for chills and fever.  Respiratory:  Negative for shortness of breath.   Cardiovascular:  Positive for leg swelling. Negative for chest pain.  Skin: Negative.        Objective:    BP (!) 150/102   Pulse 89   Temp 97.8 F (36.6 C)   Resp 18   Ht '5\' 5"'$  (1.651 m)   Wt 134 lb 4.8 oz (60.9 kg)   SpO2 98%   BMI 22.35 kg/m  BP Readings from Last 3 Encounters:  06/04/22 132/70  03/01/22 132/74  01/24/22 (!) 160/98   Wt Readings from Last 3 Encounters:  06/04/22 123 lb (55.8 kg)  03/01/22 124 lb 8 oz (56.5 kg)  02/24/22 119 lb 11.4 oz (54.3 kg)      Physical Exam Constitutional:      Appearance: Normal appearance.  HENT:     Head: Normocephalic and atraumatic.  Eyes:     Conjunctiva/sclera: Conjunctivae normal.  Cardiovascular:     Rate and Rhythm: Normal rate and regular rhythm.  Pulmonary:     Effort: Pulmonary effort is normal.     Breath sounds: Normal breath sounds.  Musculoskeletal:     Left lower leg: Edema present.     Comments: Mild non-pitting swelling in the left ankle and shin. No pain to palpation of ankle ligaments, good range of motion and strength  Skin:    General: Skin is warm and dry.  Neurological:     General: No focal deficit present.     Mental Status: He is alert. Mental status is at baseline.  Psychiatric:        Mood and Affect: Mood normal.        Behavior: Behavior normal.     No results found for any visits on 08/05/22.      Assessment &  Plan:   1. Swelling of left lower extremity: Mild but unilateral. [place] to rule out DVT. Obtain CBC, CMP today to check kidney function and electrolytes.   - CBC w/Diff/Platelet - COMPLETE METABOLIC PANEL WITH GFR - US Venous Img Lower Unilateral Left; Future  Return if symptoms worsen or fail to improve, for AWV w/ nurse.  Teodora Medici, DO

## 2022-08-05 NOTE — Telephone Encounter (Signed)
  Chief Complaint: swollen feet and ankles Symptoms: as above Frequency:  week Pertinent Negatives: Patient denies fever, sob, chest pain Disposition: '[]'$ ED /'[]'$ Urgent Care (no appt availability in office) / '[x]'$ Appointment(In office/virtual)/ '[]'$  [facility]/ '[]'$ Home Care/ '[]'$ Refused Recommended Disposition /'[]'$ [facility]/ '[]'$  Follow-up with PCP Additional Notes: Pt states his feet and ankles started swelling 1 week ago. Pt states that it is difficult to walk. Pt reports mild pain.   Summary: leg and feet swelling   Pt started having swelling a week a go in both legs and feet / please advise     Reason for Disposition  MILD or MODERATE ankle swelling (e.g., can't move joint normally, can't do usual activities) (Exceptions: Itchy, localized swelling; swelling is chronic.)  Answer Assessment - Initial Assessment Questions 1. LOCATION: "Which ankle is swollen?" "Where is the swelling?"     Both feet ankles 2. ONSET: "When did the swelling start?"     1 week 3. SWELLING: "How bad is the swelling?" Or, "How large is it?" (e.g., mild, moderate, severe; size of localized swelling)    - NONE: No joint swelling.   - LOCALIZED: Localized; small area of puffy or swollen skin (e.g., insect bite, skin irritation).   - MILD: Joint looks or feels mildly swollen or puffy.   - MODERATE: Swollen; interferes with normal activities (e.g., work or school); decreased range of movement; may be limping.   - SEVERE: Very swollen; can't move swollen joint at all; limping a lot or unable to walk.     severe 4. PAIN: "Is there any pain?" If Yes, ask: "How bad is it?" (Scale 1-10; or mild, moderate, severe)   - NONE (0): no pain.   - MILD (1-3): doesn't interfere with normal activities.    - MODERATE (4-7): interferes with normal activities (e.g., work or school) or awakens from sleep, limping.    - SEVERE (8-10): excruciating pain, unable to do any normal activities, unable to walk.      A  little 5. CAUSE: "What do you think caused the ankle swelling?"     no 6. OTHER SYMPTOMS: "Do you have any other symptoms?" (e.g., fever, chest pain, difficulty breathing, calf pain)     no 7. PREGNANCY: "Is there any chance you are pregnant?" "When was your last menstrual period?"  Protocols used: Ankle Swelling-A-AH

## 2022-08-06 LAB — CBC WITH DIFFERENTIAL/PLATELET
Absolute Monocytes: 320 cells/uL (ref 200–950)
Basophils Absolute: 29 cells/uL (ref 0–200)
Basophils Relative: 0.7 %
Eosinophils Absolute: 180 cells/uL (ref 15–500)
Eosinophils Relative: 4.4 %
HCT: 42.9 % (ref 38.5–50.0)
Hemoglobin: 14.7 g/dL (ref 13.2–17.1)
Lymphs Abs: 1624 cells/uL (ref 850–3900)
MCH: 34.8 pg — ABNORMAL HIGH (ref 27.0–33.0)
MCHC: 34.3 g/dL (ref 32.0–36.0)
MCV: 101.4 fL — ABNORMAL HIGH (ref 80.0–100.0)
MPV: 9.7 fL (ref 7.5–12.5)
Monocytes Relative: 7.8 %
Neutro Abs: 1948 cells/uL (ref 1500–7800)
Neutrophils Relative %: 47.5 %
Platelets: 176 10*3/uL (ref 140–400)
RBC: 4.23 10*6/uL (ref 4.20–5.80)
RDW: 12 % (ref 11.0–15.0)
Total Lymphocyte: 39.6 %
WBC: 4.1 10*3/uL (ref 3.8–10.8)

## 2022-08-06 LAB — COMPLETE METABOLIC PANEL WITH GFR
AG Ratio: 1.5 (calc) (ref 1.0–2.5)
ALT: 21 U/L (ref 9–46)
AST: 23 U/L (ref 10–35)
Albumin: 4.5 g/dL (ref 3.6–5.1)
Alkaline phosphatase (APISO): 79 U/L (ref 35–144)
BUN/Creatinine Ratio: 11 (calc) (ref 6–22)
BUN: 15 mg/dL (ref 7–25)
CO2: 24 mmol/L (ref 20–32)
Calcium: 9.3 mg/dL (ref 8.6–10.3)
Chloride: 107 mmol/L (ref 98–110)
Creat: 1.41 mg/dL — ABNORMAL HIGH (ref 0.70–1.30)
Globulin: 3 g/dL (calc) (ref 1.9–3.7)
Glucose, Bld: 87 mg/dL (ref 65–99)
Potassium: 4 mmol/L (ref 3.5–5.3)
Sodium: 141 mmol/L (ref 135–146)
Total Bilirubin: 1 mg/dL (ref 0.2–1.2)
Total Protein: 7.5 g/dL (ref 6.1–8.1)
eGFR: 58 mL/min/{1.73_m2} — ABNORMAL LOW (ref >=60)

## 2022-09-03 NOTE — Progress Notes (Deleted)
Name: James Padilla   MRN: [id number]    DOB: Apr 19, 1966   Date:09/03/2022       Progress Note  Subjective  Chief Complaint  Follow Up  HPI  HTN: taking losartan hctz and Atenolol 25 mg , bp is well controlled today, denies chest pain or palpitation. He states when he seats for a while he needs to get up slowly.   Centribular emphysema: he has a morning cough, that is productive ans has some SOB with activity , he has been a smoker since his late teenage years. He smokes half a pack daily, he used to smoke a pack and half a day for many years  He states since he has his own place to live stress is down and not smoking as much   Chronic neck/cervical myelopathy  and back pain: he has a long history of neuropathy, he has tingling on both arms and some myoclonus. He states has difficulty working due to the pain, waiting for disability, lives in friend's house, sleeps on a sofa. He had neck surgery scheduled for Summer 2020 but cancelled once due to COVID-19 and once due to positive drug screen for crack cocaine and was dismissed from Dr. Cari Caraway. He is still doing drugs He is taking gabapentin one capsule three times a day but still in constant pain and at times sharp pain that radiates from his neck to his sacral area. He was approved for disability and will have Medicare in June 2022. He has some balance problems intermittently We will recheck B12 next visit   Atherosclerosis of aorta: on statin therapy, denies side effects, Last LDL was below 30 , we will decrease dose from 20 mg to 10 mg . He also has coronary artery disease - discussed symptoms of angina =  Cocaine drug use: he has been smoking crack cocaine since 1986. He is going to church and trying to quit smoking.  , but he had a relapsed recently   MDD: he takes Depakote but mostly for anger. He does not want to add another medication at this time , he also would not be a good candidate since still using cocaine.  He is upset because he  only got $5000 for the back disability but he states he should have a back pay and is upset, he states he has a phone visit tomorrow and if he doesn't get what he wants he will go to the main office in [place]. Explained that the people he will talk to are not the ones deciding if he gets paid or not and he may go to jail if he is rude or aggressive with the staff.   Patient Active Problem List   Diagnosis Date Noted   Drug abuse, cocaine type ([place]) 01/10/2022   Cervical myelopathy ([place]) 01/10/2022   Mild episode of recurrent major depressive disorder ([place]) 01/10/2022   Centrilobular emphysema ([place]) 10/04/2020   Atherosclerosis of aorta ([place]) 10/04/2020   CAD in native artery 10/04/2020   Neuropathy 08/29/2020   Hypertension, benign 01/04/2019   Sebaceous cyst 12/17/2018   Nonintractable episodic headache 10/05/2018   Irritability and anger 10/05/2018   Chronic neck pain 09/19/2018   Chronic bilateral back pain 09/19/2018   Right arm pain 08/14/2018    Past Surgical History:  Procedure Laterality Date   COLONOSCOPY WITH PROPOFOL N/A 10/15/2018   Procedure: COLONOSCOPY WITH PROPOFOL;  Surgeon: Jonathon Bellows, MD;  Location: [facility] ENDOSCOPY;  Service: Gastroenterology;  Laterality: N/A;   MULTIPLE TOOTH  EXTRACTIONS      Family History  Problem Relation Age of Onset   Breast cancer Mother    Aneurysm Father     Social History   Tobacco Use   Smoking status: Every Day    Packs/day: 0.75    Years: 40.00    Total pack years: 30.00    Types: Cigarettes    Start date: 04/12/1982    Passive exposure: Past   Smokeless tobacco: Never  Substance Use Topics   Alcohol use: Yes    Comment: rarely      Current Outpatient Medications:    atenolol (TENORMIN) 25 MG tablet, Take 1 tablet (25 mg total) by mouth every evening., Disp: 90 tablet, Rfl: 1   atorvastatin (LIPITOR) 10 MG tablet, Take 1 tablet (10 mg total) by mouth daily. For your heart, Disp: 90 tablet, Rfl: 1   divalproex  (DEPAKOTE) 250 MG DR tablet, Take 1 tablet (250 mg total) by mouth in the morning and at bedtime. For mood, Disp: 180 tablet, Rfl: 1   EQ ASPIRIN ADULT LOW DOSE 81 MG EC tablet, Take 1 tablet by mouth once daily, Disp: 30 tablet, Rfl: 0   Fluticasone-Umeclidin-Vilant (TRELEGY ELLIPTA) 100-62.5-25 MCG/ACT AEPB, Inhale 1 puff into the lungs daily., Disp: 3 each, Rfl: 1   gabapentin (NEURONTIN) 300 MG capsule, Take 1 capsule (300 mg total) by mouth 3 (three) times daily., Disp: 270 capsule, Rfl: 1   losartan-hydrochlorothiazide (HYZAAR) 50-12.5 MG tablet, Take 1 tablet by mouth daily. For blood pressure, Disp: 90 tablet, Rfl: 1   tiZANidine (ZANAFLEX) 2 MG tablet, Take 1 tablet (2 mg total) by mouth 2 (two) times daily as needed for muscle spasms., Disp: 180 tablet, Rfl: 1   triamcinolone cream (KENALOG) 0.1 %, Apply 1 application topically 2 (two) times daily., Disp: 30 g, Rfl: 0  Allergies  Allergen Reactions   Septra [Sulfamethoxazole-Trimethoprim] Other (See Comments)    Renal Failure   Lactose Nausea And Vomiting    I personally reviewed active problem list, medication list, allergies, family history, social history, health maintenance with the patient/caregiver today.   ROS  ***  Objective  There were no vitals filed for this visit.  There is no height or weight on file to calculate BMI.  Physical Exam ***  Recent Results (from the past 2160 hour(s))  CBC w/Diff/Platelet     Status: Abnormal   Collection Time: 08/05/22  4:01 PM  Result Value Ref Range   WBC 4.1 3.8 - 10.8 Thousand/uL   RBC 4.23 4.20 - 5.80 Million/uL   Hemoglobin 14.7 13.2 - 17.1 g/dL   HCT 42.9 38.5 - 50.0 %   MCV 101.4 (H) 80.0 - 100.0 fL   MCH 34.8 (H) 27.0 - 33.0 pg   MCHC 34.3 32.0 - 36.0 g/dL   RDW 12.0 11.0 - 15.0 %   Platelets 176 140 - 400 Thousand/uL   MPV 9.7 7.5 - 12.5 fL   Neutro Abs 1,948 1,500 - 7,800 cells/uL   Lymphs Abs 1,624 850 - 3,900 cells/uL   Absolute Monocytes 320 200 - 950  cells/uL   Eosinophils Absolute 180 15 - 500 cells/uL   Basophils Absolute 29 0 - 200 cells/uL   Neutrophils Relative % 47.5 %   Total Lymphocyte 39.6 %   Monocytes Relative 7.8 %   Eosinophils Relative 4.4 %   Basophils Relative 0.7 %  COMPLETE METABOLIC PANEL WITH GFR     Status: Abnormal   Collection Time: 08/05/22  4:01  PM  Result Value Ref Range   Glucose, Bld 87 65 - 99 mg/dL    Comment: .            Fasting reference interval .    BUN 15 7 - 25 mg/dL   Creat 1.41 (H) 0.70 - 1.30 mg/dL   eGFR 58 (L) > OR = 60 mL/min/1.88m   BUN/Creatinine Ratio 11 6 - 22 (calc)   Sodium 141 135 - 146 mmol/L   Potassium 4.0 3.5 - 5.3 mmol/L   Chloride 107 98 - 110 mmol/L   CO2 24 20 - 32 mmol/L   Calcium 9.3 8.6 - 10.3 mg/dL   Total Protein 7.5 6.1 - 8.1 g/dL   Albumin 4.5 3.6 - 5.1 g/dL   Globulin 3.0 1.9 - 3.7 g/dL (calc)   AG Ratio 1.5 1.0 - 2.5 (calc)   Total Bilirubin 1.0 0.2 - 1.2 mg/dL   Alkaline phosphatase (APISO) 79 35 - 144 U/L   AST 23 10 - 35 U/L   ALT 21 9 - 46 U/L    PHQ2/9:    08/05/2022    3:46 PM 06/04/2022    2:52 PM 03/01/2022    3:31 PM 01/10/2022    9:53 AM 11/01/2021   11:23 AM  Depression screen PHQ 2/9  Decreased Interest 0 1 2 2 $ 0  Down, Depressed, Hopeless 0 1 0 3 0  PHQ - 2 Score 0 2 2 5 $ 0  Altered sleeping 0 1 1 3 $ 0  Tired, decreased energy 0 1 1 2 $ 0  Change in appetite 0 0 1 1 0  Feeling bad or failure about yourself  0 0 0 0 0  Trouble concentrating 0 0 1 2 0  Moving slowly or fidgety/restless 0 0 0 0 0  Suicidal thoughts 0 0 0 0 0  PHQ-9 Score 0 4 6 13 $ 0  Difficult doing work/chores Not difficult at all  Not difficult at all Somewhat difficult Not difficult at all    phq 9 is {gen pos [id number]  Fall Risk:    08/05/2022    3:43 PM 06/04/2022    2:52 PM 03/01/2022    3:31 PM 01/15/2022   10:04 AM 01/10/2022    9:52 AM  Fall Risk   Falls in the past year? 1 1 1 $ 0 1  Number falls in past yr: 1 1 1  1  $ Injury with Fall? 0 0 1  0  Risk  for fall due to :  No Fall Risks History of fall(s)  History of fall(s)  Follow up  Falls prevention discussed Education provided;Falls prevention discussed;Falls evaluation completed  Falls evaluation completed;Education provided;Falls prevention discussed      Functional Status Survey:      Assessment & Plan  *** There are no diagnoses linked to this encounter.

## 2022-09-04 ENCOUNTER — Ambulatory Visit: Payer: Medicare Other | Admitting: Family Medicine

## 2022-09-24 ENCOUNTER — Ambulatory Visit: Payer: 59 | Admitting: Physician Assistant

## 2022-10-10 ENCOUNTER — Ambulatory Visit: Payer: 59 | Attending: Acute Care

## 2022-10-31 ENCOUNTER — Other Ambulatory Visit: Payer: Self-pay | Admitting: Family Medicine

## 2022-10-31 DIAGNOSIS — I2510 Atherosclerotic heart disease of native coronary artery without angina pectoris: Secondary | ICD-10-CM

## 2022-10-31 DIAGNOSIS — I700 Atherosclerosis of aorta: Secondary | ICD-10-CM

## 2022-11-01 NOTE — Telephone Encounter (Signed)
Lvm informing prescription has been sent to pharmacy and for him to return call to schedule a follow up appt

## 2022-12-03 ENCOUNTER — Other Ambulatory Visit: Payer: Self-pay | Admitting: Family Medicine

## 2022-12-03 DIAGNOSIS — I700 Atherosclerosis of aorta: Secondary | ICD-10-CM

## 2022-12-03 DIAGNOSIS — I2510 Atherosclerotic heart disease of native coronary artery without angina pectoris: Secondary | ICD-10-CM

## 2023-01-29 NOTE — Progress Notes (Unsigned)
Name: James Padilla   MRN: [id number]    DOB: 08/23/1966   Date:01/30/2023       Progress Note  Subjective  Chief Complaint  Follow Up  HPI  HTN: taking losartan hctz and Atenolol 25 mg , bp is slightly elevated today, he states he was worried about his transportation today. BP has been elevated lately and we will adjust dose of losartan   Centribular emphysema: he has a morning cough, that is productive ans has some SOB with activity , he has been a smoker since his late teenage years. He has been smoking more lately, up to one pack daily. He states he is trying to get a car - and due an old DUI he will need to be able to use a breathalyzer before the engine starts. He asked me to write a note for him to not use the breathalyzer, explained he will need to see pulmonologist and he agreed    Chronic neck/cervical myelopathy  and back pain: he has a long history of neuropathy, he has tingling on both arms and some myoclonus. He states has difficulty working due to the pain, waiting for disability, lives in friend's house, sleeps on a sofa. He had neck surgery scheduled for Summer 2020 but cancelled once due to COVID-19 and once due to positive drug screen for crack cocaine and was dismissed from Dr. Marcell Barlow. He stopped taking gabapentin - states not effective, taking Tizanidine . He uses a cane intermittently   Amnesia: he states sometimes he goes to bed and wakes up in the living the room, does not recall what happens in between. Discussed seeing a neurologist but he is not interested at this time   Atherosclerosis of aorta: on statin therapy, denies side effects, Last LDL was below 30 , we will decrease dose from 20 mg to 10 mg . He also has coronary artery disease he has occasional chest tightness - he cannot elaborate   Cocaine drug use: he has been smoking crack cocaine since 1986. He is now using it intermittently - last use was this past weekend.   MDD: he takes Depakote but mostly for  anger. He does not want to add another medication at this time , he also would not be a good candidate since still using cocaine.  He is on his own place now, trying to get a car. He still has relapses and smokes crack cocaine intermittently   Rhinorrhea: he states he has noticed rhinorrhea for the past few months, he states he never snorted drugs, we will try flonase   Patient Active Problem List   Diagnosis Date Noted   Drug abuse, cocaine type ([facility]) 01/10/2022   Cervical myelopathy ([facility]) 01/10/2022   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) 01/10/2022   Centrilobular emphysema ([facility]) 10/04/2020   Atherosclerosis of aorta ([facility]) 10/04/2020   CAD in native artery 10/04/2020   Neuropathy 08/29/2020   Hypertension, benign 01/04/2019   Sebaceous cyst 12/17/2018   Nonintractable episodic headache 10/05/2018   Irritability and anger 10/05/2018   Chronic neck pain 09/19/2018   Chronic bilateral back pain 09/19/2018   Right arm pain 08/14/2018    Past Surgical History:  Procedure Laterality Date   COLONOSCOPY WITH PROPOFOL N/A 10/15/2018   Procedure: COLONOSCOPY WITH PROPOFOL;  Surgeon: Wyline [place], MD;  Location: [facility] ENDOSCOPY;  Service: Gastroenterology;  Laterality: N/A;   MULTIPLE TOOTH EXTRACTIONS      Family History  Problem Relation Age of Onset  Breast cancer Mother    Aneurysm Father     Social History   Tobacco Use   Smoking status: Every Day    Packs/day: 0.75    Years: 40.00    Additional pack years: 0.00    Total pack years: 30.00    Types: Cigarettes    Start date: 04/12/1982    Passive exposure: Past   Smokeless tobacco: Never  Substance Use Topics   Alcohol use: Yes    Comment: rarely      Current Outpatient Medications:    atenolol (TENORMIN) 25 MG tablet, Take 1 tablet (25 mg total) by mouth every evening., Disp: 90 tablet, Rfl: 1   atorvastatin (LIPITOR) 10 MG tablet, TAKE 1 TABLET (10 MG TOTAL) BY MOUTH DAILY FOR YOUR HEART, Disp: 90  tablet, Rfl: 0   divalproex (DEPAKOTE) 250 MG DR tablet, Take 1 tablet (250 mg total) by mouth in the morning and at bedtime. For mood, Disp: 180 tablet, Rfl: 1   EQ ASPIRIN ADULT LOW DOSE 81 MG EC tablet, Take 1 tablet by mouth once daily, Disp: 30 tablet, Rfl: 0   Fluticasone-Umeclidin-Vilant (TRELEGY ELLIPTA) 100-62.5-25 MCG/ACT AEPB, Inhale 1 puff into the lungs daily., Disp: 3 each, Rfl: 1   gabapentin (NEURONTIN) 300 MG capsule, Take 1 capsule (300 mg total) by mouth 3 (three) times daily., Disp: 270 capsule, Rfl: 1   losartan-hydrochlorothiazide (HYZAAR) 50-12.5 MG tablet, Take 1 tablet by mouth daily. For blood pressure, Disp: 90 tablet, Rfl: 1   tiZANidine (ZANAFLEX) 2 MG tablet, Take 1 tablet (2 mg total) by mouth 2 (two) times daily as needed for muscle spasms., Disp: 180 tablet, Rfl: 1   triamcinolone cream (KENALOG) 0.1 %, Apply 1 application topically 2 (two) times daily., Disp: 30 g, Rfl: 0  Allergies  Allergen Reactions   Septra [Sulfamethoxazole-Trimethoprim] Other (See Comments)    Renal Failure   Lactose Nausea And Vomiting    I personally reviewed active problem list, medication list, allergies, family history, social history, health maintenance with the patient/caregiver today.   ROS  Ten systems reviewed and is negative except as mentioned in HPI   Objective  Vitals:   01/30/23 0749 01/30/23 0758  BP: (!) 152/70 (!) 144/68  Pulse: 96   Resp: 16   SpO2: 98%   Weight: 128 lb (58.1 kg)   Height: 5\' 4"  (1.626 m)     Body mass index is 21.97 kg/m.  Physical Exam  Constitutional: Patient appears well-developed and well-nourished.  No distress.  HEENT: head atraumatic, normocephalic, pupils equal and reactive to light, neck supple Cardiovascular: Normal rate, regular rhythm and normal heart sounds.  No murmur heard. Trace  BLE edema. Pulmonary/Chest: Effort normal and breath sounds normal. No respiratory distress. Abdominal: Soft.  There is no  tenderness. Psychiatric: Patient has a normal mood and affect. behavior is normal. Judgment and thought content normal.   PHQ2/9:    01/30/2023    7:49 AM 08/05/2022    3:46 PM 06/04/2022    2:52 PM 03/01/2022    3:31 PM 01/10/2022    9:53 AM  Depression screen PHQ 2/9  Decreased Interest 0 0 1 2 2   Down, Depressed, Hopeless 0 0 1 0 3  PHQ - 2 Score 0 0 2 2 5   Altered sleeping 3 0 1 1 3   Tired, decreased energy 2 0 1 1 2   Change in appetite 0 0 0 1 1  Feeling bad or failure about yourself  0 0 0  0 0  Trouble concentrating 0 0 0 1 2  Moving slowly or fidgety/restless 0 0 0 0 0  Suicidal thoughts 0 0 0 0 0  PHQ-9 Score 5 0 4 6 13   Difficult doing work/chores  Not difficult at all  Not difficult at all Somewhat difficult    phq 9 is negative   Fall Risk:    01/30/2023    7:48 AM 08/05/2022    3:43 PM 06/04/2022    2:52 PM 03/01/2022    3:31 PM 01/15/2022   10:04 AM  Fall Risk   Falls in the past year? 1 1 1 1  0  Number falls in past yr: 1 1 1 1    Injury with Fall? 1 0 0 1   Risk for fall due to : No Fall Risks  No Fall Risks History of fall(s)   Follow up Falls prevention discussed  Falls prevention discussed Education provided;Falls prevention discussed;Falls evaluation completed       Functional Status Survey: Is the patient deaf or have difficulty hearing?: No Does the patient have difficulty seeing, even when wearing glasses/contacts?: Yes Does the patient have difficulty concentrating, remembering, or making decisions?: No Does the patient have difficulty walking or climbing stairs?: Yes Does the patient have difficulty dressing or bathing?: No Does the patient have difficulty doing errands alone such as visiting a doctor's office or shopping?: No    Assessment & Plan  1. Centrilobular emphysema (HCC)  - Ambulatory referral to Pulmonology - Fluticasone-Umeclidin-Vilant (TRELEGY ELLIPTA) 100-62.5-25 MCG/ACT AEPB; Inhale 1 puff into the lungs daily.  Dispense: 3  each; Refill: 1  2. 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CC)  - divalproex (DEPAKOTE) 250 MG DR tablet; Take 1 tablet (250 mg total) by mouth in the morning and at bedtime. For mood  Dispense: 180 tablet; Refill: 1  3. Drug abuse, cocaine type ([facility])  Still using it but no longer daily   4. Cervical myelopathy ([facility])  He stopped gabapentin   5. Atherosclerosis of aorta ([facility])  - atorvastatin (LIPITOR) 10 MG tablet; Take 1 tablet (10 mg total) by mouth daily. For your heart  Dispense: 90 tablet; Refill: 1  6. Hypertension, benign  - atenolol (TENORMIN) 25 MG tablet; Take 1 tablet (25 mg total) by mouth every evening.  Dispense: 90 tablet; Refill: 1 - losartan-hydrochlorothiazide (HYZAAR) 100-12.5 MG tablet; Take 1 tablet by mouth daily.  Dispense: 90 tablet; Refill: 1  7. CAD in native artery  - atorvastatin (LIPITOR) 10 MG tablet; Take 1 tablet (10 mg total) by mouth daily. For your heart  Dispense: 90 tablet; Refill: 1  8. Rhinorrhea  - fluticasone (FLONASE) 50 MCG/ACT nasal spray; Place 2 sprays into both nostrils daily.  Dispense: 16 g; Refill: 2  9. Irritability and anger  - divalproex (DEPAKOTE) 250 MG DR tablet; Take 1 tablet (250 mg total) by mouth in the morning and at bedtime. For mood  Dispense: 180 tablet; Refill: 1  10. Chronic bilateral back pain, unspecified back location  - tiZANidine (ZANAFLEX) 2 MG tablet; Take 1 tablet (2 mg total) by mouth 2 (two) times daily as needed for muscle spasms.  Dispense: 180 tablet; Refill: 1

## 2023-01-30 ENCOUNTER — Telehealth: Payer: Self-pay

## 2023-01-30 ENCOUNTER — Encounter: Payer: Self-pay | Admitting: Family Medicine

## 2023-01-30 ENCOUNTER — Ambulatory Visit (INDEPENDENT_AMBULATORY_CARE_PROVIDER_SITE_OTHER): Payer: 59 | Admitting: Family Medicine

## 2023-01-30 VITALS — BP 144/68 | HR 96 | Resp 16 | Ht 64.0 in | Wt 128.0 lb

## 2023-01-30 DIAGNOSIS — I700 Atherosclerosis of aorta: Secondary | ICD-10-CM

## 2023-01-30 DIAGNOSIS — I10 Essential (primary) hypertension: Secondary | ICD-10-CM

## 2023-01-30 DIAGNOSIS — F1410 Cocaine abuse, uncomplicated: Secondary | ICD-10-CM | POA: Diagnosis not present

## 2023-01-30 DIAGNOSIS — I2510 Atherosclerotic heart disease of native coronary artery without angina pectoris: Secondary | ICD-10-CM | POA: Diagnosis not present

## 2023-01-30 DIAGNOSIS — G959 Disease of spinal cord, unspecified: Secondary | ICD-10-CM

## 2023-01-30 DIAGNOSIS — F330 Major depressive disorder, recurrent, mild: Secondary | ICD-10-CM

## 2023-01-30 DIAGNOSIS — M549 Dorsalgia, unspecified: Secondary | ICD-10-CM | POA: Diagnosis not present

## 2023-01-30 DIAGNOSIS — G8929 Other chronic pain: Secondary | ICD-10-CM | POA: Diagnosis not present

## 2023-01-30 DIAGNOSIS — J432 Centrilobular emphysema: Secondary | ICD-10-CM

## 2023-01-30 DIAGNOSIS — J3489 Other specified disorders of nose and nasal sinuses: Secondary | ICD-10-CM | POA: Diagnosis not present

## 2023-01-30 DIAGNOSIS — R454 Irritability and anger: Secondary | ICD-10-CM | POA: Diagnosis not present

## 2023-01-30 MED ORDER — DIVALPROEX SODIUM 250 MG PO DR TAB: 250.0000 mg | DELAYED_RELEASE_TABLET | Freq: Two times a day (BID) | ORAL | 1 refills | Status: AC

## 2023-01-30 MED ORDER — TRELEGY ELLIPTA 100-62.5-25 MCG/ACT IN AEPB: 1.0000 | INHALATION_SPRAY | Freq: Every day | RESPIRATORY_TRACT | 1 refills | Status: DC

## 2023-01-30 MED ORDER — LOSARTAN POTASSIUM-HCTZ 100-12.5 MG PO TABS: 1.0000 | ORAL_TABLET | Freq: Every day | ORAL | 1 refills | Status: DC

## 2023-01-30 MED ORDER — TIZANIDINE HCL 2 MG PO TABS: 2.0000 mg | ORAL_TABLET | Freq: Two times a day (BID) | ORAL | 1 refills | Status: AC | PRN

## 2023-01-30 MED ORDER — ATENOLOL 25 MG PO TABS: 25.0000 mg | ORAL_TABLET | Freq: Every evening | ORAL | 1 refills | Status: DC

## 2023-01-30 MED ORDER — FLUTICASONE PROPIONATE 50 MCG/ACT NA SUSP: 2.0000 | Freq: Every day | NASAL | 2 refills | Status: AC

## 2023-01-30 MED ORDER — ATORVASTATIN CALCIUM 10 MG PO TABS: 10.0000 mg | ORAL_TABLET | Freq: Every day | ORAL | 1 refills | Status: DC

## 2023-01-30 NOTE — Telephone Encounter (Signed)
This encounter was created in error - please disregard.

## 2023-01-30 NOTE — Addendum Note (Signed)
Addended by: Dollene Primrose on: 01/30/2023 10:35 AM   Modules accepted: Orders

## 2023-01-30 NOTE — Telephone Encounter (Signed)
James Padilla from [facility] requested the Trelegy to be re written to indicate 3 boxes rather than 3 each. The insurance company was not going to voer it otherwise. She stated some providers put it in the note to pharmacy or even the SIG. It just cannot say "each."

## 2023-01-30 NOTE — Telephone Encounter (Signed)
FYI, corrected and sent to [facility] for 3 boxes in note to pharmacy

## 2023-02-01 ENCOUNTER — Emergency Department
Admission: EM | Admit: 2023-02-01 | Discharge: 2023-02-01 | Disposition: A | Discharge status: Home / Self Care | Payer: 59 | Source: Home / Self Care

## 2023-02-01 ENCOUNTER — Emergency Department: Payer: 59

## 2023-02-01 ENCOUNTER — Encounter: Payer: Self-pay | Admitting: Intensive Care

## 2023-02-01 ENCOUNTER — Inpatient Hospital Stay
Admission: EM | Admit: 2023-02-01 | Discharge: 2023-02-05 | DRG: 286 | Disposition: A | Discharge status: Home / Self Care | Payer: 59 | Attending: Internal Medicine | Admitting: Internal Medicine

## 2023-02-01 ENCOUNTER — Other Ambulatory Visit: Payer: Self-pay

## 2023-02-01 ENCOUNTER — Inpatient Hospital Stay: Payer: 59

## 2023-02-01 DIAGNOSIS — R7989 Other specified abnormal findings of blood chemistry: Secondary | ICD-10-CM

## 2023-02-01 DIAGNOSIS — J9601 Acute respiratory failure with hypoxia: Secondary | ICD-10-CM | POA: Diagnosis present

## 2023-02-01 DIAGNOSIS — I700 Atherosclerosis of aorta: Secondary | ICD-10-CM | POA: Diagnosis present

## 2023-02-01 DIAGNOSIS — Z91141 Patient's other noncompliance with medication regimen due to financial hardship: Secondary | ICD-10-CM | POA: Diagnosis not present

## 2023-02-01 DIAGNOSIS — I493 Ventricular premature depolarization: Secondary | ICD-10-CM | POA: Diagnosis present

## 2023-02-01 DIAGNOSIS — Z716 Tobacco abuse counseling: Secondary | ICD-10-CM

## 2023-02-01 DIAGNOSIS — I161 Hypertensive emergency: Principal | ICD-10-CM

## 2023-02-01 DIAGNOSIS — R0600 Dyspnea, unspecified: Secondary | ICD-10-CM | POA: Diagnosis not present

## 2023-02-01 DIAGNOSIS — E869 Volume depletion, unspecified: Secondary | ICD-10-CM | POA: Diagnosis not present

## 2023-02-01 DIAGNOSIS — I160 Hypertensive urgency: Secondary | ICD-10-CM | POA: Diagnosis present

## 2023-02-01 DIAGNOSIS — F17210 Nicotine dependence, cigarettes, uncomplicated: Secondary | ICD-10-CM | POA: Diagnosis present

## 2023-02-01 DIAGNOSIS — I429 Cardiomyopathy, unspecified: Secondary | ICD-10-CM

## 2023-02-01 DIAGNOSIS — I5021 Acute systolic (congestive) heart failure: Secondary | ICD-10-CM | POA: Diagnosis present

## 2023-02-01 DIAGNOSIS — Z556 Problems related to health literacy: Secondary | ICD-10-CM

## 2023-02-01 DIAGNOSIS — Z5986 Financial insecurity: Secondary | ICD-10-CM

## 2023-02-01 DIAGNOSIS — I2510 Atherosclerotic heart disease of native coronary artery without angina pectoris: Secondary | ICD-10-CM | POA: Diagnosis present

## 2023-02-01 DIAGNOSIS — M5412 Radiculopathy, cervical region: Secondary | ICD-10-CM | POA: Diagnosis present

## 2023-02-01 DIAGNOSIS — I080 Rheumatic disorders of both mitral and aortic valves: Secondary | ICD-10-CM | POA: Diagnosis present

## 2023-02-01 DIAGNOSIS — Z743 Need for continuous supervision: Secondary | ICD-10-CM | POA: Diagnosis not present

## 2023-02-01 DIAGNOSIS — J449 Chronic obstructive pulmonary disease, unspecified: Secondary | ICD-10-CM | POA: Insufficient documentation

## 2023-02-01 DIAGNOSIS — R0603 Acute respiratory distress: Secondary | ICD-10-CM | POA: Diagnosis not present

## 2023-02-01 DIAGNOSIS — N1831 Chronic kidney disease, stage 3a: Secondary | ICD-10-CM | POA: Diagnosis present

## 2023-02-01 DIAGNOSIS — G8929 Other chronic pain: Secondary | ICD-10-CM | POA: Diagnosis not present

## 2023-02-01 DIAGNOSIS — Z91011 Allergy to milk products: Secondary | ICD-10-CM

## 2023-02-01 DIAGNOSIS — Z560 Unemployment, unspecified: Secondary | ICD-10-CM

## 2023-02-01 DIAGNOSIS — J439 Emphysema, unspecified: Secondary | ICD-10-CM | POA: Diagnosis not present

## 2023-02-01 DIAGNOSIS — J432 Centrilobular emphysema: Secondary | ICD-10-CM | POA: Diagnosis present

## 2023-02-01 DIAGNOSIS — R778 Other specified abnormalities of plasma proteins: Secondary | ICD-10-CM | POA: Diagnosis not present

## 2023-02-01 DIAGNOSIS — I214 Non-ST elevation (NSTEMI) myocardial infarction: Secondary | ICD-10-CM | POA: Diagnosis not present

## 2023-02-01 DIAGNOSIS — F1010 Alcohol abuse, uncomplicated: Secondary | ICD-10-CM | POA: Diagnosis present

## 2023-02-01 DIAGNOSIS — I130 Hypertensive heart and chronic kidney disease with heart failure and stage 1 through stage 4 chronic kidney disease, or unspecified chronic kidney disease: Secondary | ICD-10-CM | POA: Diagnosis not present

## 2023-02-01 DIAGNOSIS — Z91199 Patient's noncompliance with other medical treatment and regimen due to unspecified reason: Secondary | ICD-10-CM

## 2023-02-01 DIAGNOSIS — Z5982 Transportation insecurity: Secondary | ICD-10-CM

## 2023-02-01 DIAGNOSIS — I2489 Other forms of acute ischemic heart disease: Secondary | ICD-10-CM | POA: Diagnosis present

## 2023-02-01 DIAGNOSIS — Z7982 Long term (current) use of aspirin: Secondary | ICD-10-CM

## 2023-02-01 DIAGNOSIS — Z7951 Long term (current) use of inhaled steroids: Secondary | ICD-10-CM | POA: Diagnosis not present

## 2023-02-01 DIAGNOSIS — J810 Acute pulmonary edema: Secondary | ICD-10-CM | POA: Diagnosis not present

## 2023-02-01 DIAGNOSIS — N179 Acute kidney failure, unspecified: Secondary | ICD-10-CM | POA: Insufficient documentation

## 2023-02-01 DIAGNOSIS — F1410 Cocaine abuse, uncomplicated: Secondary | ICD-10-CM | POA: Diagnosis present

## 2023-02-01 DIAGNOSIS — I428 Other cardiomyopathies: Secondary | ICD-10-CM | POA: Diagnosis present

## 2023-02-01 DIAGNOSIS — I959 Hypotension, unspecified: Secondary | ICD-10-CM | POA: Diagnosis not present

## 2023-02-01 DIAGNOSIS — R0689 Other abnormalities of breathing: Secondary | ICD-10-CM | POA: Diagnosis not present

## 2023-02-01 DIAGNOSIS — Z881 Allergy status to other antibiotic agents status: Secondary | ICD-10-CM | POA: Diagnosis not present

## 2023-02-01 DIAGNOSIS — T465X6A Underdosing of other antihypertensive drugs, initial encounter: Secondary | ICD-10-CM | POA: Diagnosis present

## 2023-02-01 DIAGNOSIS — R6889 Other general symptoms and signs: Secondary | ICD-10-CM | POA: Diagnosis not present

## 2023-02-01 DIAGNOSIS — Z5321 Procedure and treatment not carried out due to patient leaving prior to being seen by health care provider: Secondary | ICD-10-CM | POA: Insufficient documentation

## 2023-02-01 DIAGNOSIS — Z803 Family history of malignant neoplasm of breast: Secondary | ICD-10-CM

## 2023-02-01 DIAGNOSIS — Z79899 Other long term (current) drug therapy: Secondary | ICD-10-CM | POA: Diagnosis not present

## 2023-02-01 DIAGNOSIS — I499 Cardiac arrhythmia, unspecified: Secondary | ICD-10-CM | POA: Diagnosis not present

## 2023-02-01 LAB — CBC WITH DIFFERENTIAL/PLATELET
Abs Immature Granulocytes: 0.01 10*3/uL (ref 0.00–0.07)
Basophils Absolute: 0 10*3/uL (ref 0.0–0.1)
Basophils Relative: 1 %
Eosinophils Absolute: 0.1 10*3/uL (ref 0.0–0.5)
Eosinophils Relative: 2 %
HCT: 43.3 % (ref 39.0–52.0)
Hemoglobin: 14.7 g/dL (ref 13.0–17.0)
Immature Granulocytes: 0 %
Lymphocytes Relative: 63 %
Lymphs Abs: 3.9 10*3/uL (ref 0.7–4.0)
MCH: 34.5 pg — ABNORMAL HIGH (ref 26.0–34.0)
MCHC: 33.9 g/dL (ref 30.0–36.0)
MCV: 101.6 fL — ABNORMAL HIGH (ref 80.0–100.0)
Monocytes Absolute: 0.5 10*3/uL (ref 0.1–1.0)
Monocytes Relative: 8 %
Neutro Abs: 1.6 10*3/uL — ABNORMAL LOW (ref 1.7–7.7)
Neutrophils Relative %: 26 %
Platelets: 164 10*3/uL (ref 150–400)
RBC: 4.26 MIL/uL (ref 4.22–5.81)
RDW: 12.7 % (ref 11.5–15.5)
WBC: 6.1 10*3/uL (ref 4.0–10.5)
nRBC: 0 % (ref 0.0–0.2)

## 2023-02-01 LAB — BLOOD GAS, VENOUS
Acid-base deficit: 6.8 mmol/L — ABNORMAL HIGH (ref 0.0–2.0)
Bicarbonate: 21.6 mmol/L (ref 20.0–28.0)
O2 Saturation: 98 %
Patient temperature: 37
pCO2, Ven: 54 mmHg (ref 44–60)
pH, Ven: 7.21 — ABNORMAL LOW (ref 7.25–7.43)
pO2, Ven: 83 mmHg — ABNORMAL HIGH (ref 32–45)

## 2023-02-01 LAB — URINE DRUG SCREEN, QUALITATIVE (ARMC ONLY)
Amphetamines, Ur Screen: NOT DETECTED
Barbiturates, Ur Screen: NOT DETECTED
Benzodiazepine, Ur Scrn: NOT DETECTED
Cannabinoid 50 Ng, Ur ~~LOC~~: NOT DETECTED
Cocaine Metabolite,Ur ~~LOC~~: POSITIVE — AB
MDMA (Ecstasy)Ur Screen: NOT DETECTED
Methadone Scn, Ur: NOT DETECTED
Opiate, Ur Screen: NOT DETECTED
Phencyclidine (PCP) Ur S: NOT DETECTED
Tricyclic, Ur Screen: NOT DETECTED

## 2023-02-01 LAB — TROPONIN I (HIGH SENSITIVITY)
Troponin I (High Sensitivity): 53 ng/L — ABNORMAL HIGH (ref <18)
Troponin I (High Sensitivity): 55 ng/L — ABNORMAL HIGH (ref <18)
Troponin I (High Sensitivity): 37 ng/L — ABNORMAL HIGH (ref <18)

## 2023-02-01 LAB — COMPREHENSIVE METABOLIC PANEL
ALT: 72 U/L — ABNORMAL HIGH (ref 0–44)
AST: 112 U/L — ABNORMAL HIGH (ref 15–41)
Albumin: 3.9 g/dL (ref 3.5–5.0)
Alkaline Phosphatase: 102 U/L (ref 38–126)
Anion gap: 8 (ref 5–15)
BUN: 17 mg/dL (ref 6–20)
CO2: 20 mmol/L — ABNORMAL LOW (ref 22–32)
Calcium: 8.6 mg/dL — ABNORMAL LOW (ref 8.9–10.3)
Chloride: 108 mmol/L (ref 98–111)
Creatinine, Ser: 1.56 mg/dL — ABNORMAL HIGH (ref 0.61–1.24)
GFR, Estimated: 51 mL/min — ABNORMAL LOW (ref >60)
Glucose, Bld: 152 mg/dL — ABNORMAL HIGH (ref 70–99)
Potassium: 3.8 mmol/L (ref 3.5–5.1)
Sodium: 136 mmol/L (ref 135–145)
Total Bilirubin: 0.7 mg/dL (ref 0.3–1.2)
Total Protein: 7.6 g/dL (ref 6.5–8.1)

## 2023-02-01 LAB — SODIUM, URINE, RANDOM: Sodium, Ur: 133 mmol/L

## 2023-02-01 LAB — LACTIC ACID, PLASMA: Lactic Acid, Venous: 1.9 mmol/L (ref 0.5–1.9)

## 2023-02-01 LAB — URINALYSIS, COMPLETE (UACMP) WITH MICROSCOPIC
Bacteria, UA: NONE SEEN
Bilirubin Urine: NEGATIVE
Glucose, UA: NEGATIVE mg/dL
Hgb urine dipstick: NEGATIVE
Ketones, ur: NEGATIVE mg/dL
Leukocytes,Ua: NEGATIVE
Nitrite: NEGATIVE
Protein, ur: NEGATIVE mg/dL
Specific Gravity, Urine: 1.005 (ref 1.005–1.030)
Squamous Epithelial / HPF: NONE SEEN /HPF (ref 0–5)
WBC, UA: NONE SEEN WBC/hpf (ref 0–5)
pH: 6 (ref 5.0–8.0)

## 2023-02-01 LAB — CREATININE, URINE, RANDOM: Creatinine, Urine: 13 mg/dL

## 2023-02-01 LAB — BRAIN NATRIURETIC PEPTIDE: B Natriuretic Peptide: 842.2 pg/mL — ABNORMAL HIGH (ref 0.0–100.0)

## 2023-02-01 MED ORDER — FUROSEMIDE 20 MG PO TABS
20.0000 mg | ORAL_TABLET | Freq: Every day | ORAL | Status: DC
  Administered 2023-02-01 – 2023-02-03 (×3): 20 mg via ORAL
  Filled 2023-02-01 (×3): qty 1

## 2023-02-01 MED ORDER — NICOTINE 21 MG/24HR TD PT24
21.0000 mg | MEDICATED_PATCH | Freq: Every day | TRANSDERMAL | Status: DC
  Administered 2023-02-01 – 2023-02-05 (×5): 21 mg via TRANSDERMAL
  Filled 2023-02-01 (×5): qty 1

## 2023-02-01 MED ORDER — SODIUM CHLORIDE 0.9 % IV SOLN: 250.0000 mL | INTRAVENOUS | Status: DC | PRN

## 2023-02-01 MED ORDER — METHYLPREDNISOLONE SODIUM SUCC 125 MG IJ SOLR: 125.0000 mg | Freq: Once | INTRAMUSCULAR | Status: DC

## 2023-02-01 MED ORDER — LORAZEPAM 2 MG/ML IJ SOLN: 1.0000 mg | INTRAMUSCULAR | Status: AC | PRN

## 2023-02-01 MED ORDER — NITROGLYCERIN IN D5W 200-5 MCG/ML-% IV SOLN: 0.0000 ug/min | INTRAVENOUS | Status: DC

## 2023-02-01 MED ORDER — ATENOLOL 25 MG PO TABS: 25.0000 mg | ORAL_TABLET | Freq: Every evening | ORAL | Status: DC

## 2023-02-01 MED ORDER — TIZANIDINE HCL 2 MG PO TABS
2.0000 mg | ORAL_TABLET | Freq: Two times a day (BID) | ORAL | Status: DC | PRN
  Administered 2023-02-04: 2 mg via ORAL
  Filled 2023-02-01 (×2): qty 1

## 2023-02-01 MED ORDER — SODIUM CHLORIDE 0.9% FLUSH: 3.0000 mL | INTRAVENOUS | Status: DC | PRN

## 2023-02-01 MED ORDER — ASPIRIN 81 MG PO CHEW
324.0000 mg | CHEWABLE_TABLET | Freq: Once | ORAL | Status: AC
  Administered 2023-02-01: 324 mg via ORAL
  Filled 2023-02-01: qty 4

## 2023-02-01 MED ORDER — FUROSEMIDE 10 MG/ML IJ SOLN
40.0000 mg | Freq: Once | INTRAMUSCULAR | Status: AC
  Administered 2023-02-01: 40 mg via INTRAVENOUS
  Filled 2023-02-01: qty 4

## 2023-02-01 MED ORDER — LORAZEPAM 1 MG PO TABS
1.0000 mg | ORAL_TABLET | ORAL | Status: AC | PRN
  Administered 2023-02-02: 2 mg via ORAL
  Filled 2023-02-01: qty 2

## 2023-02-01 MED ORDER — IPRATROPIUM-ALBUTEROL 0.5-2.5 (3) MG/3ML IN SOLN
RESPIRATORY_TRACT | Status: AC
  Filled 2023-02-01: qty 9

## 2023-02-01 MED ORDER — ENOXAPARIN SODIUM 40 MG/0.4ML IJ SOSY
40.0000 mg | PREFILLED_SYRINGE | INTRAMUSCULAR | Status: DC
  Administered 2023-02-02: 40 mg via SUBCUTANEOUS
  Filled 2023-02-01 (×2): qty 0.4

## 2023-02-01 MED ORDER — UMECLIDINIUM BROMIDE 62.5 MCG/ACT IN AEPB
1.0000 | INHALATION_SPRAY | Freq: Every day | RESPIRATORY_TRACT | Status: DC
  Administered 2023-02-01 – 2023-02-05 (×4): 1 via RESPIRATORY_TRACT
  Filled 2023-02-01: qty 7

## 2023-02-01 MED ORDER — IPRATROPIUM-ALBUTEROL 0.5-2.5 (3) MG/3ML IN SOLN
12.0000 mL | Freq: Once | RESPIRATORY_TRACT | Status: AC
  Administered 2023-02-01: 12 mL via RESPIRATORY_TRACT

## 2023-02-01 MED ORDER — LABETALOL HCL 5 MG/ML IV SOLN: 10.0000 mg | INTRAVENOUS | Status: DC | PRN

## 2023-02-01 MED ORDER — THIAMINE HCL 100 MG/ML IJ SOLN
100.0000 mg | Freq: Every day | INTRAMUSCULAR | Status: DC
  Filled 2023-02-01: qty 2

## 2023-02-01 MED ORDER — THIAMINE MONONITRATE 100 MG PO TABS
100.0000 mg | ORAL_TABLET | Freq: Every day | ORAL | Status: DC
  Administered 2023-02-01 – 2023-02-05 (×5): 100 mg via ORAL
  Filled 2023-02-01 (×5): qty 1

## 2023-02-01 MED ORDER — SODIUM CHLORIDE 0.9% FLUSH
3.0000 mL | Freq: Two times a day (BID) | INTRAVENOUS | Status: DC
  Administered 2023-02-01 – 2023-02-03 (×6): 3 mL via INTRAVENOUS

## 2023-02-01 MED ORDER — FOLIC ACID 1 MG PO TABS
1.0000 mg | ORAL_TABLET | Freq: Every day | ORAL | Status: DC
  Administered 2023-02-01 – 2023-02-05 (×5): 1 mg via ORAL
  Filled 2023-02-01 (×5): qty 1

## 2023-02-01 MED ORDER — NITROGLYCERIN IN D5W 200-5 MCG/ML-% IV SOLN
INTRAVENOUS | Status: AC
  Administered 2023-02-01: 100 ug/min via INTRAVENOUS
  Filled 2023-02-01: qty 250

## 2023-02-01 MED ORDER — ONDANSETRON HCL 4 MG PO TABS: 4.0000 mg | ORAL_TABLET | Freq: Four times a day (QID) | ORAL | Status: DC | PRN

## 2023-02-01 MED ORDER — ONDANSETRON HCL 4 MG/2ML IJ SOLN: 4.0000 mg | Freq: Four times a day (QID) | INTRAMUSCULAR | Status: DC | PRN

## 2023-02-01 MED ORDER — DIVALPROEX SODIUM 250 MG PO DR TAB
250.0000 mg | DELAYED_RELEASE_TABLET | Freq: Two times a day (BID) | ORAL | Status: DC
  Administered 2023-02-01 – 2023-02-05 (×8): 250 mg via ORAL
  Filled 2023-02-01 (×9): qty 1

## 2023-02-01 MED ORDER — ATORVASTATIN CALCIUM 10 MG PO TABS
10.0000 mg | ORAL_TABLET | Freq: Every day | ORAL | Status: DC
  Administered 2023-02-01 – 2023-02-03 (×3): 10 mg via ORAL
  Filled 2023-02-01 (×3): qty 1

## 2023-02-01 MED ORDER — FLUTICASONE FUROATE-VILANTEROL 100-25 MCG/ACT IN AEPB
1.0000 | INHALATION_SPRAY | Freq: Every day | RESPIRATORY_TRACT | Status: DC
  Administered 2023-02-01 – 2023-02-05 (×4): 1 via RESPIRATORY_TRACT
  Filled 2023-02-01: qty 28

## 2023-02-01 MED ORDER — LOSARTAN POTASSIUM-HCTZ 100-12.5 MG PO TABS: 1.0000 | ORAL_TABLET | Freq: Every day | ORAL | Status: DC

## 2023-02-01 MED ORDER — ADULT MULTIVITAMIN W/MINERALS CH
1.0000 | ORAL_TABLET | Freq: Every day | ORAL | Status: DC
  Administered 2023-02-01 – 2023-02-05 (×5): 1 via ORAL
  Filled 2023-02-01 (×5): qty 1

## 2023-02-01 MED ORDER — ASPIRIN 81 MG PO TBEC
81.0000 mg | DELAYED_RELEASE_TABLET | Freq: Every day | ORAL | Status: DC
  Administered 2023-02-02 – 2023-02-05 (×4): 81 mg via ORAL
  Filled 2023-02-01 (×4): qty 1

## 2023-02-01 MED ORDER — HYDROCHLOROTHIAZIDE 12.5 MG PO TABS: 12.5000 mg | ORAL_TABLET | Freq: Every day | ORAL | Status: DC

## 2023-02-01 MED ORDER — LOSARTAN POTASSIUM 50 MG PO TABS: 100.0000 mg | ORAL_TABLET | Freq: Every day | ORAL | Status: DC

## 2023-02-01 NOTE — Assessment & Plan Note (Signed)
No history of coronary artery disease Noted minimal-mild NSTEMI with troponin in the 50s in the setting of cocaine use and volume overload EKG stable Suspect mild demand ischemia associated with cardiomyopathy and cocaine use Status post full dose aspirin Continue to trend Follow-up cardiology recommendations

## 2023-02-01 NOTE — Assessment & Plan Note (Signed)
Creatinine 1.5 today with GFR in the 50s Looks to be a recurring issue Will check FENa Hold nephrotoxic agents for now Check renal ultrasound Follow

## 2023-02-01 NOTE — Assessment & Plan Note (Signed)
Patient reports regular drinking of 1-2 shots of TMJ daily No reported beer or wine use Will place on CIWA protocol prophylactically Alcohol level pending Follow

## 2023-02-01 NOTE — ED Provider Notes (Signed)
[facility] Provider Note    Event Date/Time   First MD Initiated Contact with Patient 02/01/23 1043     (approximate)   History   No chief complaint on file.   HPI  James Padilla is a 57 y.o. male with history of COPD, cocaine use, HTN, CAD presenting to the emergency department for evaluation of difficulty breathing.  Patient reports that 3 to 4 days ago he began to have worsening shortness of breath.  Per EMS, patient was found with labored respirations, unable to obtain an O2 sat.  He was noted to have wheezing in multiple lung fields.  He was given multiple rounds of nebulizer treatments with some improvement in his air movement.  He was placed on CPAP with improvement in his O2 sat to 100%.  He also noted to be significantly hypertensive with systolics around 220.     Physical Exam   Triage Vital Signs: ED Triage Vitals [02/01/23 1042]  Enc Vitals Group     BP      Pulse      Resp      Temp      Temp src      SpO2      Weight 128 lb (58.1 kg)     Height 5\' 4"  (1.626 m)     Head Circumference      Peak Flow      Pain Score      Pain Loc      Pain Edu?      Excl. in GC?     Most recent vital signs: Vitals:   02/01/23 1345 02/01/23 1400  BP: (!) 122/95 (!) 142/81  Pulse: 82 79  Resp: 10 17  Temp:    SpO2: 96% 98%     General: Awake, interactive  CV:  Tachycardic with regular rhythm, normal peripheral refrigeration, diaphoretic Resp:  Lungs diminished bilaterally, lots of transmitted upper airway noise limiting exam, but no clearly appreciable wheezes or crackles Abd:  Soft, nondistended.  Neuro:  Symmetric facial movement, fluid speech   ED Results / Procedures / Treatments   Labs (all labs ordered are listed, but only abnormal results are displayed) Labs Reviewed  COMPREHENSIVE METABOLIC PANEL - Abnormal; Notable for the following components:      Result Value   CO2 20 (*)    Glucose, Bld 152 (*)    Creatinine, Ser 1.56  (*)    Calcium 8.6 (*)    AST 112 (*)    ALT 72 (*)    GFR, Estimated 51 (*)    All other components within normal limits  CBC WITH DIFFERENTIAL/PLATELET - Abnormal; Notable for the following components:   MCV 101.6 (*)    MCH 34.5 (*)    Neutro Abs 1.6 (*)    All other components within normal limits  BRAIN NATRIURETIC PEPTIDE - Abnormal; Notable for the following components:   B Natriuretic Peptide 842.2 (*)    All other components within normal limits  URINE DRUG SCREEN, QUALITATIVE ([facility] ONLY) - Abnormal; Notable for the following components:   Cocaine Metabolite,Ur [place] POSITIVE (*)    All other components within normal limits  BLOOD GAS, VENOUS - Abnormal; Notable for the following components:   pH, Ven 7.21 (*)    pO2, Ven 83 (*)    Acid-base deficit 6.8 (*)    All other components within normal limits  TROPONIN I (HIGH SENSITIVITY) - Abnormal; Notable for the following components:  Troponin I (High Sensitivity) 53 (*)    All other components within normal limits  TROPONIN I (HIGH SENSITIVITY) - Abnormal; Notable for the following components:   Troponin I (High Sensitivity) 55 (*)    All other components within normal limits  CULTURE, BLOOD (ROUTINE X 2)  CULTURE, BLOOD (ROUTINE X 2)  LACTIC ACID, PLASMA  HIV ANTIBODY (ROUTINE TESTING W REFLEX)  ETHANOL     EKG EKG independently reviewed interpreted by myself (ER attending) demonstrates:    RADIOLOGY Imaging independently reviewed and interpreted by myself demonstrates:    PROCEDURES:  Critical Care performed: Yes, see critical care procedure note(s)  CRITICAL CARE Performed by: Trinna Post   Total critical care time: 38 minutes  Critical care time was exclusive of separately billable procedures and treating other patients.  Critical care was necessary to treat or prevent imminent or life-threatening deterioration.  Critical care was time spent personally by me on the following activities: development of  treatment plan with patient and/or surrogate as well as nursing, discussions with consultants, evaluation of patient's response to treatment, examination of patient, obtaining history from patient or surrogate, ordering and performing treatments and interventions, ordering and review of laboratory studies, ordering and review of radiographic studies, pulse oximetry and re-evaluation of patient's condition.   Procedures   MEDICATIONS ORDERED IN ED: Medications  nitroGLYCERIN 50 mg in dextrose 5 % 250 mL (0.2 mg/mL) infusion (15 mcg/min Intravenous Rate/Dose Change 02/01/23 1323)  enoxaparin (LOVENOX) injection 40 mg (has no administration in time range)  sodium chloride flush (NS) 0.9 % injection 3 mL (has no administration in time range)  sodium chloride flush (NS) 0.9 % injection 3 mL (has no administration in time range)  0.9 %  sodium chloride infusion (has no administration in time range)  ondansetron (ZOFRAN) tablet 4 mg (has no administration in time range)    Or  ondansetron (ZOFRAN) injection 4 mg (has no administration in time range)  ipratropium-albuterol (DUONEB) 0.5-2.5 (3) MG/3ML nebulizer solution 12 mL (12 mLs Nebulization Given 02/01/23 1050)  furosemide (LASIX) injection 40 mg (40 mg Intravenous Given 02/01/23 1112)  aspirin chewable tablet 324 mg (324 mg Oral Given 02/01/23 1327)     IMPRESSION / MDM / ASSESSMENT AND PLAN / ED COURSE  I reviewed the triage vital signs and the nursing notes.  Differential diagnosis includes, but is not limited to, COPD exacerbation, SCAPE, CHF exacerbation, ACS, pneumonia, lower suspicion pulmonary embolism with multiple alternative etiologies of shortness of breath  Patient's presentation is most consistent with acute presentation with potential threat to life or bodily function.  57 year old male presenting in acute respiratory distress on CPAP.  Patient was transitioned to our BiPAP machine with slight improvement in work of breathing.  He  remains tachycardic, O2 sats in the upper 90s, but with labored respirations.  His clinical presentation seems most consistent with flash pulmonary edema in the setting of hypertensive emergency, though his lung sounds are not clearly wet.  Consideration also for severe COPD exacerbation, possibly exacerbated by crack use.  Will treat with nebulizers, has already received steroids, significantly hypertensive with systolics in the 230s, I have ordered nitroglycerin drip.   X-Deshay Kirstein did demonstrate evidence of pulmonary edema.  40 of IV Lasix ordered.  Patient reevaluated and had urinated multiple times following this.  His work of breathing does seem improved.  Will discussed with RN trying to wean off of BiPAP.  His blood pressure is improved on a nitro drip.  Will reach  out to hospitalist team for admission.  Case discussed with Dr. Alvester Morin. Patient to be admitted for further evaluation.      FINAL CLINICAL IMPRESSION(S) / ED DIAGNOSES   Final diagnoses:  Hypertensive emergency  Acute pulmonary edema (HCC)  Elevated troponin     Rx / DC Orders   ED Discharge Orders     None        Note:  This document was prepared using Dragon voice recognition software and may include unintentional dictation errors.   Trinna Post, MD 02/01/23 [phone]

## 2023-02-01 NOTE — Assessment & Plan Note (Signed)
Blood pressures 230s to 130s in the setting of active crack cocaine use BP improving with IV nitro drip Discussed importance of cessation Titrate home regimen As needed Ativan for sympathomimetic drive

## 2023-02-01 NOTE — Assessment & Plan Note (Signed)
Stable from a resp standpoint  ?Cont home inhalers ?Follow  ?

## 2023-02-01 NOTE — H&P (Addendum)
History and Physical    Patient: James Padilla ZOX:[id number] DOB: [birth year] DOA: 02/01/2023 DOS: the patient was seen and examined on 02/01/2023 PCP: Alba Cory, MD  Patient coming from: Home  Chief Complaint: No chief complaint on file.  HPI: James Padilla is a 57 y.o. male with medical history significant of hypertension, emphysema, substance abuse with cocaine, depression, coronary artery disease, presenting with hypertensive urgency, cardiomyopathy, NSTEMI.  Patient reports increased work of breathing over the past 1 to 2 weeks.  Positive orthopnea, PND and lower extremity swelling.  Baseline crack cocaine use.  Patient reports heavy crack use just about every other day.  Also drinks 1-2 shots of E&J  on a fairly regular basis.  Worsening shortness of breath despite symptoms.  No chest pain.  No nausea or vomiting.  No hemiparesis or confusion.  Patient ambiguous about salt intake.  Had significantly worsening shortness of breath over the past 12 to 24 hours.  1 pack/day smoker.  No reported wheezing or cough. Presented to the ER afebrile, heart rate 100s, initially requiring BiPAP and transition to 2 L nasal cannula to room air after IV Lasix.  White count 6.1, hemoglobin 14.7, platelets 164, urine drug screen positive for cocaine, troponin 53 and 55, creatinine 1.56.  AST 112, ALT 72.  Chest x-ray with pulmonary vascular congestion with bilateral basilar reticular opacities concerning for pulmonary edema versus atypical infection.  EKG normal sinus rhythm. Review of Systems: As mentioned in the history of present illness. All other systems reviewed and are negative. Past Medical History:  Diagnosis Date   Abscess 12/17/2018   Anxiety    Depression    Dyspnea    Hypertension    Neck pain    Weakness of extremity Right arm and hand   Past Surgical History:  Procedure Laterality Date   COLONOSCOPY WITH PROPOFOL N/A 10/15/2018   Procedure: COLONOSCOPY WITH PROPOFOL;  Surgeon: Wyline Mood, MD;  Location: [facility] ENDOSCOPY;  Service: Gastroenterology;  Laterality: N/A;   MULTIPLE TOOTH EXTRACTIONS     Social History:  reports that he has been smoking cigarettes. He started smoking about 40 years ago. He has a 30.00 pack-year smoking history. He has been exposed to tobacco smoke. He has never used smokeless tobacco. He reports current alcohol use. He reports current drug use. Frequency: 2.00 times per week. Drugs: Marijuana and Cocaine.  Allergies  Allergen Reactions   Septra [Sulfamethoxazole-Trimethoprim] Other (See Comments)    Renal Failure   Lactose Nausea And Vomiting    Family History  Problem Relation Age of Onset   Breast cancer Mother    Aneurysm Father     Prior to Admission medications   Medication Sig Start Date End Date Taking? Authorizing Provider  atenolol (TENORMIN) 25 MG tablet Take 1 tablet (25 mg total) by mouth every evening. 01/30/23  Yes Sowles, Danna Hefty, MD  atorvastatin (LIPITOR) 10 MG tablet Take 1 tablet (10 mg total) by mouth daily. For your heart 01/30/23  Yes Sowles, Danna Hefty, MD  divalproex (DEPAKOTE) 250 MG DR tablet Take 1 tablet (250 mg total) by mouth in the morning and at bedtime. For mood 01/30/23  Yes Sowles, Danna Hefty, MD  EQ ASPIRIN ADULT LOW DOSE 81 MG EC tablet Take 1 tablet by mouth once daily 08/29/21  Yes Margarita Mail, DO  fluticasone [facility]) 50 MCG/ACT nasal spray Place 2 sprays into both nostrils daily. 01/30/23  Yes Sowles, Danna Hefty, MD  Fluticasone-Umeclidin-Vilant (TRELEGY ELLIPTA) 100-62.5-25 MCG/ACT AEPB Inhale 1 puff into  the lungs daily. 01/30/23  Yes Sowles, Danna Hefty, MD  losartan-hydrochlorothiazide (HYZAAR) 100-12.5 MG tablet Take 1 tablet by mouth daily. 01/30/23  Yes Sowles, Danna Hefty, MD  tiZANidine (ZANAFLEX) 2 MG tablet Take 1 tablet (2 mg total) by mouth 2 (two) times daily as needed for muscle spasms. 01/30/23  Yes Sowles, Danna Hefty, MD  triamcinolone cream (KENALOG) 0.1 % Apply 1 application topically 2 (two) times daily.  04/24/21  Yes Alba Cory, MD    Physical Exam: Vitals:   02/01/23 1430 02/01/23 1445 02/01/23 1500 02/01/23 1515  BP: 138/78 120/77 133/85 127/86  Pulse: 90 91 93 92  Resp: 13 13 13 13   Temp:      TempSrc:      SpO2: 95% 96% 94% 95%  Weight:      Height:       Physical Exam Constitutional:      Appearance: He is normal weight.  HENT:     Head: Normocephalic.     Nose: Nose normal.     Mouth/Throat:     Mouth: Mucous membranes are moist.  Eyes:     Pupils: Pupils are equal, round, and reactive to light.  Cardiovascular:     Rate and Rhythm: Normal rate and regular rhythm.  Pulmonary:     Effort: Pulmonary effort is normal.  Abdominal:     General: Bowel sounds are normal.  Musculoskeletal:        General: Normal range of motion.     Cervical back: Normal range of motion.     Comments: Trace LE bilaterally   Skin:    General: Skin is warm.  Neurological:     General: No focal deficit present.  Psychiatric:        Mood and Affect: Mood normal.     Data Reviewed:  There are no new results to review at this time.  DG Chest Port 1 View CLINICAL DATA:  Questionable sepsis, evaluate for abnormality. Respiratory distress.  EXAM: PORTABLE CHEST 1 VIEW  COMPARISON:  09/01/2021.  FINDINGS: Pulmonary venous congestion with bilateral basilar predominant reticular opacities, favored to represent pulmonary edema, though atypical/viral infection is a differential consideration. No pleural effusion or pneumothorax. Stable cardiac and mediastinal contours.  IMPRESSION: Pulmonary venous congestion with bilateral basilar predominant reticular opacities, favored to represent pulmonary edema, though atypical/viral infection is a differential consideration.  Electronically Signed   By: Orvan Falconer M.D.   On: 02/01/2023 11:48  Lab Results  Component Value Date   WBC 6.1 02/01/2023   HGB 14.7 02/01/2023   HCT 43.3 02/01/2023   MCV 101.6 (H) 02/01/2023   PLT  164 02/01/2023   Last metabolic panel Lab Results  Component Value Date   GLUCOSE 152 (H) 02/01/2023   NA 136 02/01/2023   K 3.8 02/01/2023   CL 108 02/01/2023   CO2 20 (L) 02/01/2023   BUN 17 02/01/2023   CREATININE 1.56 (H) 02/01/2023   GFRNONAA 51 (L) 02/01/2023   CALCIUM 8.6 (L) 02/01/2023   PROT 7.6 02/01/2023   ALBUMIN 3.9 02/01/2023   BILITOT 0.7 02/01/2023   ALKPHOS 102 02/01/2023   AST 112 (H) 02/01/2023   ALT 72 (H) 02/01/2023   ANIONGAP 8 02/01/2023    Assessment and Plan: * Hypertensive urgency Blood pressures 230s to 130s in the setting of active crack cocaine use BP improving with IV nitro drip Discussed importance of cessation Titrate home regimen As needed Ativan for sympathomimetic drive   Cardiomyopathy (HCC) Noted flash pulmonary edema requiring BiPAP  on presentation BNP 842 Chest x-ray with pulmonary vascular congestion and concern for pulmonary edema Worsening orthopnea and PND at home x 1 to 2 weeks Suspect element of cocaine versus polysubstance induced cardiomyopathy Status post IV Lasix in the ER 2D echo Will formally consult cardiology for further recommendations Strict ins and outs and daily weights  CAD in native artery No history of coronary artery disease Noted minimal-mild NSTEMI with troponin in the 50s in the setting of cocaine use and volume overload EKG stable Suspect mild demand ischemia associated with cardiomyopathy and cocaine use Status post full dose aspirin Continue to trend Follow-up cardiology recommendations  AKI (acute kidney injury) (HCC) Creatinine 1.5 today with GFR in the 50s Looks to be a recurring issue Will check FENa Hold nephrotoxic agents for now Check renal ultrasound Follow  ETOH abuse Patient reports regular drinking of 1-2 shots of TMJ daily No reported beer or wine use Will place on CIWA protocol prophylactically Alcohol level pending Follow  Centrilobular emphysema (HCC) Stable from a  resp standpoint  Cont home inhalers  Follow    Tobacco abuse counseling 1 pack/day smoker Discussed cessation Nicotine patch.      Advance Care Planning:   Code Status: Full Code   Consults: Cardiology w/ Dr. Mariah Milling   Family Communication: No family at the bedside   Severity of Illness: The appropriate patient status for this patient is INPATIENT. Inpatient status is judged to be reasonable and necessary in order to provide the required intensity of service to ensure the patient's safety. The patient's presenting symptoms, physical exam findings, and initial radiographic and laboratory data in the context of their chronic comorbidities is felt to place them at high risk for further clinical deterioration. Furthermore, it is not anticipated that the patient will be medically stable for discharge from the hospital within 2 midnights of admission.   * I certify that at the point of admission it is my clinical judgment that the patient will require inpatient hospital care spanning beyond 2 midnights from the point of admission due to high intensity of service, high risk for further deterioration and high frequency of surveillance required.*  Author: Floydene Flock, MD 02/01/2023 3:34 PM  For on call review [email or web address].

## 2023-02-01 NOTE — Assessment & Plan Note (Signed)
1 pack/day smoker Discussed cessation Nicotine patch 

## 2023-02-01 NOTE — Assessment & Plan Note (Signed)
Noted flash pulmonary edema requiring BiPAP on presentation BNP 842 Chest x-ray with pulmonary vascular congestion and concern for pulmonary edema Worsening orthopnea and PND at home x 1 to 2 weeks Suspect element of cocaine versus polysubstance induced cardiomyopathy Status post IV Lasix in the ER 2D echo Will formally consult cardiology for further recommendations Strict ins and outs and daily weights

## 2023-02-02 ENCOUNTER — Inpatient Hospital Stay (HOSPITAL_COMMUNITY)
Admit: 2023-02-02 | Discharge: 2023-02-02 | Disposition: A | Discharge status: Home / Self Care | Payer: 59 | Attending: Family Medicine | Admitting: Family Medicine

## 2023-02-02 DIAGNOSIS — J810 Acute pulmonary edema: Secondary | ICD-10-CM

## 2023-02-02 DIAGNOSIS — Z91141 Patient's other noncompliance with medication regimen due to financial hardship: Secondary | ICD-10-CM

## 2023-02-02 DIAGNOSIS — R0603 Acute respiratory distress: Secondary | ICD-10-CM

## 2023-02-02 DIAGNOSIS — I160 Hypertensive urgency: Secondary | ICD-10-CM | POA: Diagnosis not present

## 2023-02-02 DIAGNOSIS — J432 Centrilobular emphysema: Secondary | ICD-10-CM

## 2023-02-02 DIAGNOSIS — R7989 Other specified abnormal findings of blood chemistry: Secondary | ICD-10-CM

## 2023-02-02 DIAGNOSIS — I5021 Acute systolic (congestive) heart failure: Secondary | ICD-10-CM

## 2023-02-02 DIAGNOSIS — I161 Hypertensive emergency: Principal | ICD-10-CM

## 2023-02-02 LAB — COMPREHENSIVE METABOLIC PANEL
ALT: 49 U/L — ABNORMAL HIGH (ref 0–44)
AST: 45 U/L — ABNORMAL HIGH (ref 15–41)
Albumin: 3.7 g/dL (ref 3.5–5.0)
Alkaline Phosphatase: 90 U/L (ref 38–126)
Anion gap: 8 (ref 5–15)
BUN: 26 mg/dL — ABNORMAL HIGH (ref 6–20)
CO2: 24 mmol/L (ref 22–32)
Calcium: 8.9 mg/dL (ref 8.9–10.3)
Chloride: 106 mmol/L (ref 98–111)
Creatinine, Ser: 1.54 mg/dL — ABNORMAL HIGH (ref 0.61–1.24)
GFR, Estimated: 52 mL/min — ABNORMAL LOW (ref >60)
Glucose, Bld: 99 mg/dL (ref 70–99)
Potassium: 4.1 mmol/L (ref 3.5–5.1)
Sodium: 138 mmol/L (ref 135–145)
Total Bilirubin: 1.2 mg/dL (ref 0.3–1.2)
Total Protein: 7.1 g/dL (ref 6.5–8.1)

## 2023-02-02 LAB — ECHOCARDIOGRAM COMPLETE
AR max vel: 1.32 cm2
AV Area VTI: 1.04 cm2
AV Area mean vel: 1.42 cm2
AV Mean grad: 5.5 mmHg
AV Peak grad: 9.7 mmHg
Ao pk vel: 1.56 m/s
Area-P 1/2: 4.68 cm2
Calc EF: 28.2 %
Height: 64 in
MV M vel: 4.64 m/s
MV Peak grad: 86.1 mmHg
MV VTI: 1.37 cm2
P 1/2 time: 479 msec
Radius: 0.2 cm
S' Lateral: 4.1 cm
Single Plane A2C EF: 42.2 %
Single Plane A4C EF: 11 %
Weight: 2047.99 oz

## 2023-02-02 LAB — CULTURE, BLOOD (ROUTINE X 2)

## 2023-02-02 LAB — CBC
HCT: 42.2 % (ref 39.0–52.0)
Hemoglobin: 14.7 g/dL (ref 13.0–17.0)
MCH: 34.6 pg — ABNORMAL HIGH (ref 26.0–34.0)
MCHC: 34.8 g/dL (ref 30.0–36.0)
MCV: 99.3 fL (ref 80.0–100.0)
Platelets: 159 10*3/uL (ref 150–400)
RBC: 4.25 MIL/uL (ref 4.22–5.81)
RDW: 12.2 % (ref 11.5–15.5)
WBC: 5.8 10*3/uL (ref 4.0–10.5)
nRBC: 0 % (ref 0.0–0.2)

## 2023-02-02 LAB — HIV ANTIBODY (ROUTINE TESTING W REFLEX): HIV Screen 4th Generation wRfx: NONREACTIVE

## 2023-02-02 MED ORDER — ATENOLOL 25 MG PO TABS
25.0000 mg | ORAL_TABLET | Freq: Every day | ORAL | Status: DC
  Administered 2023-02-02 – 2023-02-03 (×2): 25 mg via ORAL
  Filled 2023-02-02 (×2): qty 1

## 2023-02-02 MED ORDER — PERFLUTREN LIPID MICROSPHERE
1.0000 mL | INTRAVENOUS | Status: AC | PRN
  Administered 2023-02-02: 3 mL via INTRAVENOUS

## 2023-02-02 NOTE — Consult Note (Signed)
Cardiology Consultation   Patient ID: James Padilla MRN: [id number]; DOB: [birth year]  Admit date: 02/01/2023 Date of Consult: 02/02/2023  PCP:  Alba Cory, MD   [facility] Providers Cardiologist:  None      New consult completed by Dr Mariah Milling  Patient Profile:   James Padilla is a 57 y.o. male with a hx of COPD, cocaine use, hypertension, atherosclerosis of the aorta,, cervical myelopathy, centrilobular emphysema, depression, irritability and anger, chronic neck and back pain, neuropathy, who is being seen 02/02/2023 for the evaluation of possible cardiomyopathy and NSTEMI at the request of Dr. Alvester Morin.  History of Present Illness:   [name] presented to the [facility] emergency department on 02/01/2023 for difficulty breathing.  He had stated it started approximately 3 to 4 days ago and he began having worsening shortness of breath.  Per EMS patient was found with labored respirations, unable to obtain O2 saturations.  He was noted to have wheezing throughout and was given multiple rounds of nebulizer treatments with some improvement in his air movement.  He was placed on CPAP with improvement in his O2 with an oxygen saturation of 200%.  He was also found to be significantly hypertensive with blood pressures around 220 mm Hg.  He had endorsed orthopnea and PND and lower extremity swelling.  Continues to use crack cocaine.  Patient reported heavy crack use about every other day he also drinks 1-2 shots of EN J on a fairly regular basis.  He denied any chest discomfort, nausea/vomiting, diaphoresis.  He does not follow a low sodium diet and has had swelling for quite some time.  He is also a current smoker of 1 pack/day.  Had reported any previous episodes of wheezing or cough.  Also states that he may take his current medications on an every other day basis.  Initial vitals: Blood pressure 122/95, pulse 52, respirations of 10, SpO2 96%  Pertinent labs: CO2 of 20, blood glucose 152, serum  creatinine 1.56, calcium 8.6, AST 112, ALT 72, GFR 51, BNP 842.2, urine drug screen positive for cocaine, high-sensitivity troponin 53, 55, and 37  Imaging: Chest x-ray revealed pulmonary venous congestion with bilateral bibasilar predominant reticular opacities, favored to represent pulmonary edema, though atypical/viral infection is a differential consideration; renal ultrasound completed which shows no collecting system dilatation  Medications administered in the emergency department: Nitroglycerin drip, Lovenox 40 mg, furosemide 40 mg IVP, aspirin 324 mg, and DuoNeb  Cardiology was consulted for hypertensive emergency and mildly elevated high-sensitivity troponins  Past Medical History:  Diagnosis Date   Abscess 12/17/2018   Anxiety    Depression    Dyspnea    Hypertension    Neck pain    Weakness of extremity Right arm and hand    Past Surgical History:  Procedure Laterality Date   COLONOSCOPY WITH PROPOFOL N/A 10/15/2018   Procedure: COLONOSCOPY WITH PROPOFOL;  Surgeon: Wyline Mood, MD;  Location: [facility] ENDOSCOPY;  Service: Gastroenterology;  Laterality: N/A;   MULTIPLE TOOTH EXTRACTIONS       Home Medications:  Prior to Admission medications   Medication Sig Start Date End Date Taking? Authorizing Provider  atenolol (TENORMIN) 25 MG tablet Take 1 tablet (25 mg total) by mouth every evening. 01/30/23  Yes Sowles, Danna Hefty, MD  atorvastatin (LIPITOR) 10 MG tablet Take 1 tablet (10 mg total) by mouth daily. For your heart 01/30/23  Yes Sowles, Danna Hefty, MD  divalproex (DEPAKOTE) 250 MG DR tablet Take 1 tablet (250 mg total)  by mouth in the morning and at bedtime. For mood 01/30/23  Yes Sowles, Danna Hefty, MD  EQ ASPIRIN ADULT LOW DOSE 81 MG EC tablet Take 1 tablet by mouth once daily 08/29/21  Yes Margarita Mail, DO  fluticasone [facility]) 50 MCG/ACT nasal spray Place 2 sprays into both nostrils daily. 01/30/23  Yes Sowles, Danna Hefty, MD  Fluticasone-Umeclidin-Vilant (TRELEGY ELLIPTA)  100-62.5-25 MCG/ACT AEPB Inhale 1 puff into the lungs daily. 01/30/23  Yes Sowles, Danna Hefty, MD  losartan-hydrochlorothiazide (HYZAAR) 100-12.5 MG tablet Take 1 tablet by mouth daily. 01/30/23  Yes Sowles, Danna Hefty, MD  tiZANidine (ZANAFLEX) 2 MG tablet Take 1 tablet (2 mg total) by mouth 2 (two) times daily as needed for muscle spasms. 01/30/23  Yes Sowles, Danna Hefty, MD  triamcinolone cream (KENALOG) 0.1 % Apply 1 application topically 2 (two) times daily. 04/24/21  Yes Alba Cory, MD    Inpatient Medications: Scheduled Meds:  aspirin EC  81 mg Oral Daily   atorvastatin  10 mg Oral Daily   divalproex  250 mg Oral Q12H   enoxaparin (LOVENOX) injection  40 mg Subcutaneous Q24H   fluticasone furoate-vilanterol  1 puff Inhalation Daily   And   umeclidinium bromide  1 puff Inhalation Daily   folic acid  1 mg Oral Daily   furosemide  20 mg Oral Daily   multivitamin with minerals  1 tablet Oral Daily   nicotine  21 mg Transdermal Daily   sodium chloride flush  3 mL Intravenous Q12H   thiamine  100 mg Oral Daily   Or   thiamine  100 mg Intravenous Daily   Continuous Infusions:  sodium chloride     nitroGLYCERIN Stopped (02/01/23 1637)   PRN Meds: sodium chloride, labetalol, LORazepam **OR** LORazepam, ondansetron **OR** ondansetron (ZOFRAN) IV, sodium chloride flush, tiZANidine  Allergies:    Allergies  Allergen Reactions   Septra [Sulfamethoxazole-Trimethoprim] Other (See Comments)    Renal Failure   Lactose Nausea And Vomiting    Social History:   Social History   Socioeconomic History   Marital status: Single    Spouse name: Not on file   Number of children: 2   Years of education: 12   Highest education level: High school graduate  Occupational History   Occupation: unemployed  Tobacco Use   Smoking status: Every Day    Packs/day: 0.75    Years: 40.00    Additional pack years: 0.00    Total pack years: 30.00    Types: Cigarettes    Start date: 04/12/1982    Passive  exposure: Past   Smokeless tobacco: Never  Vaping Use   Vaping Use: Never used  Substance and Sexual Activity   Alcohol use: Yes    Comment: rarely    Drug use: Yes    Frequency: 2.0 times per week    Types: Marijuana, Cocaine    Comment: crack   Sexual activity: Not Currently    Partners: Female  Other Topics Concern   Not on file  Social History Narrative   Lives in a friends house, sofa    Social Determinants of Health   Financial Resource Strain: High Risk (12/01/2020)   Overall Financial Resource Strain (CARDIA)    Difficulty of Paying Living Expenses: Hard  Food Insecurity: No Food Insecurity (12/01/2020)   Hunger Vital Sign    Worried About Running Out of Food in the Last Year: Never true    Ran Out of Food in the Last Year: Never true  Transportation Needs: Unmet Transportation  Needs (12/01/2020)   PRAPARE - Administrator, Civil Service (Medical): Yes    Lack of Transportation (Non-Medical): Yes  Physical Activity: Inactive (12/01/2020)   Exercise Vital Sign    Days of Exercise per Week: 0 days    Minutes of Exercise per Session: 0 min  Stress: Stress Concern Present (12/01/2020)   [employer] of Occupational Health - Occupational Stress Questionnaire    Feeling of Stress : Very much  Social Connections: Unknown (12/01/2020)   Social Connection and Isolation Panel [NHANES]    Frequency of Communication with Friends and Family: Not on file    Frequency of Social Gatherings with Friends and Family: Never    Attends Religious Services: 1 to 4 times per year    Active Member of [employer] or Organizations: Yes    Attends Banker Meetings: Never    Marital Status: Never married  Intimate Partner Violence: Not At Risk (04/11/2021)   Humiliation, Afraid, Rape, and Kick questionnaire    Fear of Current or Ex-Partner: No    Emotionally Abused: No    Physically Abused: No    Sexually Abused: No    Family History:    Family History  Problem Relation  Age of Onset   Breast cancer Mother    Aneurysm Father      ROS:  Please see the history of present illness.  Review of Systems  Respiratory:  Positive for cough, shortness of breath and wheezing.   Cardiovascular:  Positive for orthopnea, leg swelling and PND.  Psychiatric/Behavioral:  Positive for substance abuse.     All other ROS reviewed and negative.     Physical Exam/Data:   Vitals:   02/02/23 0430 02/02/23 0500 02/02/23 0530 02/02/23 0600  BP: (!) 140/91 125/87 128/84 (!) 130/93  Pulse: 90 71 76 86  Resp: 16 18 14 15   Temp:    98.1 F (36.7 C)  TempSrc:    Oral  SpO2: 96% 96% 96% 98%  Weight:      Height:        Intake/Output Summary (Last 24 hours) at 02/02/2023 0738 Last data filed at 02/01/2023 2101 Gross per 24 hour  Intake --  Output 1675 ml  Net -1675 ml      02/01/2023   10:42 AM 01/30/2023    7:49 AM 08/05/2022    3:42 PM  Last 3 Weights  Weight (lbs) 128 lb 128 lb 134 lb 4.8 oz  Weight (kg) 58.06 kg 58.06 kg 60.918 kg     Body mass index is 21.97 kg/m.  General:  Well nourished, well developed, in no acute distress HEENT: normal Neck: no JVD Vascular: No carotid bruits; Distal pulses 2+ bilaterally Cardiac:  normal S1, S2; RRR; no murmur  Lungs:  coarse with expiratory wheezing to auscultation bilaterally, respirations are unlabored at rest on room air Abd: soft, nontender, no hepatomegaly  Ext: trace edema Musculoskeletal:  No deformities, BUE and BLE strength normal and equal Skin: warm and dry  Neuro:  CNs 2-12 intact, no focal abnormalities noted Psych:  Normal affect   EKG:  The EKG was personally reviewed and demonstrates: Sinus tachycardia rate of 115, LVH early repolarization, unifocal PVCs, ST depression T wave inversion in lateral leads Telemetry:  Telemetry was personally reviewed and demonstrates:  sinus with occasional PVC's rates in the 90's  Relevant CV Studies: Echocardiogram ordered and pending  Laboratory Data:  High  Sensitivity Troponin:   Recent Labs  Lab 02/01/23 1043  2023/02/05 1241 February 05, 2023 2109  TROPONINIHS 53* 55* 37*     Chemistry Recent Labs  Lab 05-Feb-2023 1043 02/02/23 0535  NA 136 138  K 3.8 4.1  CL 108 106  CO2 20* 24  GLUCOSE 152* 99  BUN 17 26*  CREATININE 1.56* 1.54*  CALCIUM 8.6* 8.9  GFRNONAA 51* 52*  ANIONGAP 8 8    Recent Labs  Lab 05-Feb-2023 1043 02/02/23 0535  PROT 7.6 7.1  ALBUMIN 3.9 3.7  AST 112* 45*  ALT 72* 49*  ALKPHOS 102 90  BILITOT 0.7 1.2   Lipids No results for input(s): "CHOL", "TRIG", "HDL", "LABVLDL", "LDLCALC", "CHOLHDL" in the last 168 hours.  Hematology Recent Labs  Lab 05-Feb-2023 1043 02/02/23 0535  WBC 6.1 5.8  RBC 4.26 4.25  HGB 14.7 14.7  HCT 43.3 42.2  MCV 101.6* 99.3  MCH 34.5* 34.6*  MCHC 33.9 34.8  RDW 12.7 12.2  PLT 164 159   Thyroid No results for input(s): "TSH", "FREET4" in the last 168 hours.  BNP Recent Labs  Lab February 05, 2023 1041  BNP 842.2*    DDimer No results for input(s): "DDIMER" in the last 168 hours.   Radiology/Studies:  US RENAL  Result Date: 02-05-23 CLINICAL DATA:  Acute kidney injury EXAM: RENAL / URINARY TRACT ULTRASOUND COMPLETE COMPARISON:  None Available. FINDINGS: Right Kidney: Renal measurements: 8.5 x 3.9 x 5.8 cm = volume: 101.6 mL. Echogenicity within normal limits. No mass or hydronephrosis visualized. Left Kidney: Renal measurements: 9.7 x 4.8 x 5.0 cm = volume: 122 mL. Echogenicity within normal limits. No mass or hydronephrosis visualized. Bladder: Appears normal for degree of bladder distention. Other: None. IMPRESSION: No collecting system dilatation Electronically Signed   By: Karen Kays M.D.   On: 2023-02-05 17:10   DG Chest Port 1 View  Result Date: Feb 05, 2023 CLINICAL DATA:  Questionable sepsis, evaluate for abnormality. Respiratory distress. EXAM: PORTABLE CHEST 1 VIEW COMPARISON:  09/01/2021. FINDINGS: Pulmonary venous congestion with bilateral basilar predominant reticular opacities,  favored to represent pulmonary edema, though atypical/viral infection is a differential consideration. No pleural effusion or pneumothorax. Stable cardiac and mediastinal contours. IMPRESSION: Pulmonary venous congestion with bilateral basilar predominant reticular opacities, favored to represent pulmonary edema, though atypical/viral infection is a differential consideration. Electronically Signed   By: Orvan Falconer M.D.   On: 02/05/23 11:48     Assessment and Plan:   Hypertensive urgency -blood pressure originally 220 systolic -improved on Nitro drip overnight, drip stopped at 1637 on 02/05/2023 -Restart home medication regimen with titrating medications to prevent rebound hypertension since off nitro drip, previously was taking atenolol and losartan HCTZ -Continue with as needed labetalol -Vital signs per unit protocol  Cardiomyopathy -Patient presented with shortness of breath over the last 1 to 2 weeks and increased swelling to his bilateral lower extremities -High-sensitivity troponin mildly elevated 53, 55, and 37, likely secondary to hypertensive urgency, flash pulmonary edema, and recent crack cocaine use -Longstanding history of hypertension -LVH with early repolarization noted on EKG -BNP 842.2 -Chest x-ray revealed pulmonary edema -Given 40 mg of IV Lasix with net -1.6 L output since admission -Echocardiogram ordered and pending with further recommendations to follow -Daily weights, I's and O's, low-sodium diet  Acute kidney injury -Serum creatinine 1.54 -Monitor urine output -Renal ultrasound completed, results unrevealing -FENa pending -Daily BMP -Monitor trend/replete electrolytes as needed -Avoid nephrotoxic agents were able  Centrilobular emphysema/COPD -Originally with shortness of breath had to be placed on CPAP due to hypoxia -CPAP was to  be essentially transition to BiPAP -Chest x-ray revealed pulmonary edema -Patient received IV Lasix 20 mg -Continue with  PTA inhalers  Polysubstance abuse tobacco, cocaine, alcohol -Urine drug screen positive for cocaine -Continues to smoke 1 pack/day -Placed on NicoDerm CQ patch  -Total cessation is recommended  Medical nonadherence -Multiple encounters reviewed where patient is not taking medication as prescribed -Adherence to medication regimen reiterated  History of atherosclerosis of the aorta -Noted prior on CT scan -No history of coronary artery disease -Continue on aspirin and statin   Risk Assessment/Risk Scores:        New York Heart Association (NYHA) Functional Class NYHA Class II        For questions or updates, please contact [facility] Please consult [email or web address] for contact info under    Signed, Elienai Gailey, NP  02/02/2023 7:38 AM

## 2023-02-02 NOTE — ED Notes (Signed)
US at bedside for echo.

## 2023-02-02 NOTE — ED Notes (Signed)
Pt called out to nurses station stating that he "feels funny". Pt denied pain, just stated that he started feeling super hot all of the sudden. Pt diaphorectic upon assessment. Immediately repeated vital signs. VSS. Repeated EKG. Mayford Knife, MD notified. Modesto Charon, MD handed hard copy of EKG to sign.

## 2023-02-02 NOTE — ED Notes (Signed)
Verified with ordering MD that atenolol is appropriate to give in the setting of a cocaine positive UDS. Per MD, cardiology approved the order.

## 2023-02-02 NOTE — Progress Notes (Addendum)
PROGRESS NOTE   HPI was taken from Dr. Alvester Morin: James Padilla is a 57 y.o. male with medical history significant of hypertension, emphysema, substance abuse with cocaine, depression, coronary artery disease, presenting with hypertensive urgency, cardiomyopathy, NSTEMI.  Patient reports increased work of breathing over the past 1 to 2 weeks.  Positive orthopnea, PND and lower extremity swelling.  Baseline crack cocaine use.  Patient reports heavy crack use just about every other day.  Also drinks 1-2 shots of E&J  on a fairly regular basis.  Worsening shortness of breath despite symptoms.  No chest pain.  No nausea or vomiting.  No hemiparesis or confusion.  Patient ambiguous about salt intake.  Had significantly worsening shortness of breath over the past 12 to 24 hours.  1 pack/day smoker.  No reported wheezing or cough. Presented to the ER afebrile, heart rate 100s, initially requiring BiPAP and transition to 2 L nasal cannula to room air after IV Lasix.  White count 6.1, hemoglobin 14.7, platelets 164, urine drug screen positive for cocaine, troponin 53 and 55, creatinine 1.56.  AST 112, ALT 72.  Chest x-ray with pulmonary vascular congestion with bilateral basilar reticular opacities concerning for pulmonary edema versus atypical infection.  EKG normal sinus rhythm.    James Padilla  UVO:[id number] DOB: [birth year] DOA: 02/01/2023 PCP: Alba Cory, MD   Assessment & Plan:   Principal Problem:   Hypertensive urgency Active Problems:   Cardiomyopathy (HCC)   CAD in native artery   Tobacco abuse counseling   Centrilobular emphysema (HCC)   ETOH abuse   AKI (acute kidney injury) (HCC)  Assessment and Plan: Hypertensive urgency: initially started on IV nitro drip but has since been weaned off. Possibly secondary to cocaine use. Urgency resolved but still w/ HTN. Will restart home atenolol as per cardio   Cardiomyopathy: unknown type. Echo ordered. No ischemic work-up planned at this time as  per cardio.   Flash pulmonary edema: initially required BiPAP but has since been weaned off. Continue on lasix. Monitor I/Os. Cardio recs apprec   Cocaine use: received illicit drug use cessation counseling. Does not want resources to help himself quit   Elevated troponins: minimally elevated. Likely demand ischemia as per cardio. NSTEMI r/o. Cardio recs apprec   AKI: Cr is trending down slightly today. US renal shows no collecting system dilatation. Holding losartan, HCTZ secondary to AKI   Alcohol abuse: drinks 1-2 shots of TMJ daily as per admitting physician. Alcohol cessation counseling. Continue on CIWA protocol   Transaminitis: likely secondary to illicit drug use & alcohol abuse. Will continue to monitor    Centrilobular emphysema: w/o exacerbation. Bronchodilators prn    Tobacco abuse: received cessation counseling already. Nicotine patch to prevent w/drawal         DVT prophylaxis: lovenox  Code Status: full  Family Communication:  Disposition Plan: likely d/c back home   Level of care: cardiac tele Status is: Inpatient Remains inpatient appropriate because: severity of illness    Consultants:  Cardio  Procedures:  Antimicrobials:  Subjective: Pt c/o malaise   Objective: Vitals:   02/02/23 0430 02/02/23 0500 02/02/23 0530 02/02/23 0600  BP: (!) 140/91 125/87 128/84 (!) 130/93  Pulse: 90 71 76 86  Resp: 16 18 14 15   Temp:    98.1 F (36.7 C)  TempSrc:    Oral  SpO2: 96% 96% 96% 98%  Weight:      Height:        Intake/Output Summary (Last 24  hours) at 02/02/2023 0905 Last data filed at 02/01/2023 2101 Gross per 24 hour  Intake --  Output 1675 ml  Net -1675 ml   Filed Weights   02/01/23 1042  Weight: 58.1 kg    Examination:  General exam: Appears calm and comfortable  Respiratory system: decreased breath sounds b/l  Cardiovascular system: S1 & S2 +. No rubs, gallops or clicks.  Gastrointestinal system: Abdomen is nondistended, soft and  nontender. normal bowel sounds heard. Central nervous system: Alert and oriented. Moves all extremities  Psychiatry: Judgement and insight appear normal. Mood & affect appropriate.     Data Reviewed: I have personally reviewed following labs and imaging studies  CBC: Recent Labs  Lab 02/01/23 1043 02/02/23 0535  WBC 6.1 5.8  NEUTROABS 1.6*  --   HGB 14.7 14.7  HCT 43.3 42.2  MCV 101.6* 99.3  PLT 164 159   Basic Metabolic Panel: Recent Labs  Lab 02/01/23 1043 02/02/23 0535  NA 136 138  K 3.8 4.1  CL 108 106  CO2 20* 24  GLUCOSE 152* 99  BUN 17 26*  CREATININE 1.56* 1.54*  CALCIUM 8.6* 8.9   GFR: Estimated Creatinine Clearance: 43 mL/min (A) (by C-G formula based on SCr of 1.54 mg/dL (H)). Liver Function Tests: Recent Labs  Lab 02/01/23 1043 02/02/23 0535  AST 112* 45*  ALT 72* 49*  ALKPHOS 102 90  BILITOT 0.7 1.2  PROT 7.6 7.1  ALBUMIN 3.9 3.7   No results for input(s): "LIPASE", "AMYLASE" in the last 168 hours. No results for input(s): "AMMONIA" in the last 168 hours. Coagulation Profile: No results for input(s): "INR", "PROTIME" in the last 168 hours. Cardiac Enzymes: No results for input(s): "CKTOTAL", "CKMB", "CKMBINDEX", "TROPONINI" in the last 168 hours. BNP (last 3 results) No results for input(s): "PROBNP" in the last 8760 hours. HbA1C: No results for input(s): "HGBA1C" in the last 72 hours. CBG: No results for input(s): "GLUCAP" in the last 168 hours. Lipid Profile: No results for input(s): "CHOL", "HDL", "LDLCALC", "TRIG", "CHOLHDL", "LDLDIRECT" in the last 72 hours. Thyroid Function Tests: No results for input(s): "TSH", "T4TOTAL", "FREET4", "T3FREE", "THYROIDAB" in the last 72 hours. Anemia Panel: No results for input(s): "VITAMINB12", "FOLATE", "FERRITIN", "TIBC", "IRON", "RETICCTPCT" in the last 72 hours. Sepsis Labs: Recent Labs  Lab 02/01/23 1041  LATICACIDVEN 1.9    Recent Results (from the past 240 hour(s))  Blood Culture  (routine x 2)     Status: None (Preliminary result)   Collection Time: 02/01/23 10:42 AM   Specimen: BLOOD  Result Value Ref Range Status   Specimen Description BLOOD LEFT ANTECUBITAL  Final   Special Requests   Final    BOTTLES DRAWN AEROBIC AND ANAEROBIC Blood Culture adequate volume   Culture   Final    NO GROWTH < 24 HOURS Performed at [facility], [address] [place], [place] [address]    Report Status PENDING  Incomplete  Blood Culture (routine x 2)     Status: None (Preliminary result)   Collection Time: 02/01/23 10:42 AM   Specimen: BLOOD  Result Value Ref Range Status   Specimen Description BLOOD BLOOD RIGHT FOREARM  Final   Special Requests   Final    BOTTLES DRAWN AEROBIC AND ANAEROBIC Blood Culture results may not be optimal due to an excessive volume of blood received in culture bottles   Culture   Final    NO GROWTH < 24 HOURS Performed at [facility], [address] [place], [place] [address]    Report Status PENDING  Incomplete         Radiology Studies: US RENAL  Result Date: 02/01/2023 CLINICAL DATA:  Acute kidney injury EXAM: RENAL / URINARY TRACT ULTRASOUND COMPLETE COMPARISON:  None Available. FINDINGS: Right Kidney: Renal measurements: 8.5 x 3.9 x 5.8 cm = volume: 101.6 mL. Echogenicity within normal limits. No mass or hydronephrosis visualized. Left Kidney: Renal measurements: 9.7 x 4.8 x 5.0 cm = volume: 122 mL. Echogenicity within normal limits. No mass or hydronephrosis visualized. Bladder: Appears normal for degree of bladder distention. Other: None. IMPRESSION: No collecting system dilatation Electronically Signed   By: Karen Kays M.D.   On: 02/01/2023 17:10   DG Chest Port 1 View  Result Date: 02/01/2023 CLINICAL DATA:  Questionable sepsis, evaluate for abnormality. Respiratory distress. EXAM: PORTABLE CHEST 1 VIEW COMPARISON:  09/01/2021. FINDINGS: Pulmonary venous congestion with bilateral basilar predominant reticular  opacities, favored to represent pulmonary edema, though atypical/viral infection is a differential consideration. No pleural effusion or pneumothorax. Stable cardiac and mediastinal contours. IMPRESSION: Pulmonary venous congestion with bilateral basilar predominant reticular opacities, favored to represent pulmonary edema, though atypical/viral infection is a differential consideration. Electronically Signed   By: Orvan Falconer M.D.   On: 02/01/2023 11:48        Scheduled Meds:  aspirin EC  81 mg Oral Daily   atorvastatin  10 mg Oral Daily   divalproex  250 mg Oral Q12H   enoxaparin (LOVENOX) injection  40 mg Subcutaneous Q24H   fluticasone furoate-vilanterol  1 puff Inhalation Daily   And   umeclidinium bromide  1 puff Inhalation Daily   folic acid  1 mg Oral Daily   furosemide  20 mg Oral Daily   multivitamin with minerals  1 tablet Oral Daily   nicotine  21 mg Transdermal Daily   sodium chloride flush  3 mL Intravenous Q12H   thiamine  100 mg Oral Daily   Or   thiamine  100 mg Intravenous Daily   Continuous Infusions:  sodium chloride     nitroGLYCERIN Stopped (02/01/23 1637)     LOS: 1 day    Time spent: 35 mins     Charise Killian, MD [facility] Pager [phone]  If 7PM-7AM, please contact night-coverage [email or web address] 02/02/2023, 9:05 AM

## 2023-02-03 DIAGNOSIS — I5021 Acute systolic (congestive) heart failure: Secondary | ICD-10-CM

## 2023-02-03 LAB — CBC
HCT: 43.8 % (ref 39.0–52.0)
Hemoglobin: 15 g/dL (ref 13.0–17.0)
MCH: 33.8 pg (ref 26.0–34.0)
MCHC: 34.2 g/dL (ref 30.0–36.0)
MCV: 98.6 fL (ref 80.0–100.0)
Platelets: 163 10*3/uL (ref 150–400)
RBC: 4.44 MIL/uL (ref 4.22–5.81)
RDW: 12.6 % (ref 11.5–15.5)
WBC: 5.7 10*3/uL (ref 4.0–10.5)
nRBC: 0 % (ref 0.0–0.2)

## 2023-02-03 LAB — COMPREHENSIVE METABOLIC PANEL
ALT: 42 U/L (ref 0–44)
AST: 33 U/L (ref 15–41)
Albumin: 3.7 g/dL (ref 3.5–5.0)
Alkaline Phosphatase: 84 U/L (ref 38–126)
Anion gap: 9 (ref 5–15)
BUN: 27 mg/dL — ABNORMAL HIGH (ref 6–20)
CO2: 25 mmol/L (ref 22–32)
Calcium: 8.8 mg/dL — ABNORMAL LOW (ref 8.9–10.3)
Chloride: 105 mmol/L (ref 98–111)
Creatinine, Ser: 1.6 mg/dL — ABNORMAL HIGH (ref 0.61–1.24)
GFR, Estimated: 50 mL/min — ABNORMAL LOW (ref >60)
Glucose, Bld: 100 mg/dL — ABNORMAL HIGH (ref 70–99)
Potassium: 4 mmol/L (ref 3.5–5.1)
Sodium: 139 mmol/L (ref 135–145)
Total Bilirubin: 1 mg/dL (ref 0.3–1.2)
Total Protein: 7 g/dL (ref 6.5–8.1)

## 2023-02-03 LAB — ETHANOL: Alcohol, Ethyl (B): <10 mg/dL (ref <10)

## 2023-02-03 MED ORDER — ISOSORB DINITRATE-HYDRALAZINE 20-37.5 MG PO TABS
1.0000 | ORAL_TABLET | Freq: Three times a day (TID) | ORAL | Status: DC
  Administered 2023-02-03 – 2023-02-04 (×2): 1 via ORAL
  Filled 2023-02-03 (×2): qty 1

## 2023-02-03 MED ORDER — SODIUM CHLORIDE 0.9 % IV SOLN: INTRAVENOUS | Status: DC

## 2023-02-03 MED ORDER — ASPIRIN 81 MG PO CHEW
81.0000 mg | CHEWABLE_TABLET | ORAL | Status: AC
  Administered 2023-02-04: 81 mg via ORAL
  Filled 2023-02-03: qty 1

## 2023-02-03 MED ORDER — SODIUM CHLORIDE 0.9% FLUSH: 3.0000 mL | INTRAVENOUS | Status: DC | PRN

## 2023-02-03 MED ORDER — FUROSEMIDE 40 MG PO TABS
40.0000 mg | ORAL_TABLET | Freq: Two times a day (BID) | ORAL | Status: DC
  Administered 2023-02-03 – 2023-02-04 (×2): 40 mg via ORAL
  Filled 2023-02-03 (×2): qty 1

## 2023-02-03 MED ORDER — SODIUM CHLORIDE 0.9 % IV SOLN: 250.0000 mL | INTRAVENOUS | Status: DC | PRN

## 2023-02-03 MED ORDER — CARVEDILOL 3.125 MG PO TABS
3.1250 mg | ORAL_TABLET | Freq: Two times a day (BID) | ORAL | Status: DC
  Administered 2023-02-04 – 2023-02-05 (×3): 3.125 mg via ORAL
  Filled 2023-02-03 (×3): qty 1

## 2023-02-03 MED ORDER — SODIUM CHLORIDE 0.9% FLUSH
3.0000 mL | Freq: Two times a day (BID) | INTRAVENOUS | Status: DC
  Administered 2023-02-03: 3 mL via INTRAVENOUS

## 2023-02-03 NOTE — ED Notes (Signed)
Pt up to bedside commode independently for BM

## 2023-02-03 NOTE — ED Notes (Signed)
Pt able to perform perineal care on self after BM. Pt changed into pajamas from home independently. Pt steady on feet without assistance. Pt dyspneic with mild exertion of changing clothes. Bed linens changed. Pt given warm blankets and reattached to cardiac monitoring.

## 2023-02-03 NOTE — H&P (View-Only) (Signed)
 Progress Note  Patient Name: James Padilla Date of Encounter: 02/03/2023  Primary Cardiologist: New - consult by [facility]  Subjective   No chest pain, dyspnea, palpitations, dizziness, presyncope, or syncope.  Blood pressure has improved into the 120s bpm.  Echo this admission showed a new cardiomyopathy with an EF of 30 to 35% with global hypokinesis, grade 1 diastolic dysfunction, mild to moderate mitral regurgitation, and moderate to severe aortic insufficiency.  Documented urine output 2.1 L for the admission.  Inpatient Medications    Scheduled Meds:  aspirin EC  81 mg Oral Daily   atenolol  25 mg Oral Daily   atorvastatin  10 mg Oral Daily   divalproex  250 mg Oral Q12H   enoxaparin (LOVENOX) injection  40 mg Subcutaneous Q24H   fluticasone furoate-vilanterol  1 puff Inhalation Daily   And   umeclidinium bromide  1 puff Inhalation Daily   folic acid  1 mg Oral Daily   furosemide  20 mg Oral Daily   multivitamin with minerals  1 tablet Oral Daily   nicotine  21 mg Transdermal Daily   sodium chloride flush  3 mL Intravenous Q12H   thiamine  100 mg Oral Daily   Or   thiamine  100 mg Intravenous Daily   Continuous Infusions:  sodium chloride     PRN Meds: sodium chloride, labetalol, LORazepam **OR** LORazepam, ondansetron **OR** ondansetron (ZOFRAN) IV, sodium chloride flush, tiZANidine   Vital Signs    Vitals:   02/03/23 0700 02/03/23 0754 02/03/23 0925 02/03/23 0930  BP: 118/70 118/70 124/83   Pulse: 81 63 64 77  Resp: 14 16    Temp:  97.6 F (36.4 C)    TempSrc:  Oral    SpO2: 97% 96%  98%  Weight:      Height:        Intake/Output Summary (Last 24 hours) at 02/03/2023 1147 Last data filed at 02/02/2023 1322 Gross per 24 hour  Intake --  Output 425 ml  Net -425 ml   Filed Weights   02/01/23 1042  Weight: 58.1 kg    Telemetry    Sinus rhythm with sinus bradycardia - Personally Reviewed  ECG    No new tracings - Personally Reviewed  Physical  Exam   GEN: No acute distress.   Neck: No JVD. Cardiac: RRR, II/VI systolic and II.VI diastolic murmur, no rubs, or gallops.  Respiratory: Clear to auscultation bilaterally, with poor inspiratory effort.  GI: Soft, nontender, non-distended.   MS: No edema; No deformity. Neuro:  Alert and oriented x 3; Nonfocal.  Psych: Normal affect.  Labs    Chemistry Recent Labs  Lab 02/01/23 1043 02/02/23 0535 02/03/23 0634  NA 136 138 139  K 3.8 4.1 4.0  CL 108 106 105  CO2 20* 24 25  GLUCOSE 152* 99 100*  BUN 17 26* 27*  CREATININE 1.56* 1.54* 1.60*  CALCIUM 8.6* 8.9 8.8*  PROT 7.6 7.1 7.0  ALBUMIN 3.9 3.7 3.7  AST 112* 45* 33  ALT 72* 49* 42  ALKPHOS 102 90 84  BILITOT 0.7 1.2 1.0  GFRNONAA 51* 52* 50*  ANIONGAP 8 8 9     Hematology Recent Labs  Lab 02/01/23 1043 02/02/23 0535 02/03/23 0634  WBC 6.1 5.8 5.7  RBC 4.26 4.25 4.44  HGB 14.7 14.7 15.0  HCT 43.3 42.2 43.8  MCV 101.6* 99.3 98.6  MCH 34.5* 34.6* 33.8  MCHC 33.9 34.8 34.2  RDW 12.7 12.2 12.6    PLT 164 159 163    Cardiac EnzymesNo results for input(s): "TROPONINI" in the last 168 hours. No results for input(s): "TROPIPOC" in the last 168 hours.   BNP Recent Labs  Lab 02/01/23 1041  BNP 842.2*     DDimer No results for input(s): "DDIMER" in the last 168 hours.   Radiology     CXR 02/01/2023: IMPRESSION: Pulmonary venous congestion with bilateral basilar predominant reticular opacities, favored to represent pulmonary edema, though atypical/viral infection is a differential consideration.  US RENAL  Result Date: 02/01/2023 IMPRESSION: No collecting system dilatation Electronically Signed   By: Ashok  Gupta M.D.   On: 02/01/2023 17:10    Cardiac Studies   2D echo 02/02/2023: 1. Left ventricular ejection fraction, by estimation, is 30 to 35%. The  left ventricle has moderately decreased function. The left ventricle  demonstrates global hypokinesis. There is moderate left ventricular  hypertrophy.  Left ventricular diastolic  parameters are consistent with Grade I diastolic dysfunction (impaired  relaxation). The average left ventricular global longitudinal strain is  -10.3 %.   2. Right ventricular systolic function is normal. The right ventricular  size is normal. Tricuspid regurgitation signal is inadequate for assessing  PA pressure.   3. The mitral valve is normal in structure. Mild to moderate mitral valve  regurgitation. No evidence of mitral stenosis.   4. The aortic valve is normal in structure. There is mild calcification  of the aortic valve. Aortic valve regurgitation is moderate to severe.  Aortic valve sclerosis/calcification is present, without any evidence of  aortic stenosis.   5. The inferior vena cava is normal in size with greater than 50%  respiratory variability, suggesting right atrial pressure of 3 mmHg.   Patient Profile     57 y.o. male with history of cocaine use, COPD, aortic atherosclerosis, HTN, cervical myelopathy with neuropathy, depression, and irritability and anger who we are evaluating for acute HFrEF and elevated troponin.  Assessment & Plan    1.  Acute HFrEF: -Etiology and chronicity uncertain -Cannot exclude ischemic etiology at this time given multiple risk factors including ongoing tobacco and cocaine use -Possibly hypertensive in etiology -Recommend R/LHC on 6/11, however patient does not agree to this at this time indicated he wants to discuss this further with his family -Optimize GDMT with transition of atenolol to carvedilol, and if BP allows addition of BiDil with further escalation of GDMT as tolerated moving forward throughout the admission -CHF education -Daily weights, strict I's and O's  2.  Elevated high-sensitivity troponin: -Mildly elevated and flat trending, not consistent with ACS -No indication for heparin drip -Recommend pursuing diagnostic R/LHC given cardiomyopathy as outlined above -Aspirin, atorvastatin,  carvedilol  3.  Valvular heart disease: -Optimize medical therapy, pursue cath as outlined above  4.  Polysubstance abuse: -Ongoing cocaine and tobacco use -Complete cessation is recommended  5.  Medication noncompliance: -Not taking medications in the outpatient setting as directed -Compliance recommended   Shared Decision Making/Informed Consent{  The risks [stroke (1 in 1000), death (1 in 1000), kidney failure [usually temporary] (1 in 500), bleeding (1 in 200), allergic reaction [possibly serious] (1 in 200)], benefits (diagnostic support and management of coronary artery disease) and alternatives of a cardiac catheterization were discussed in detail with Mr. Bordas and he wants to discuss further with family prior to moving forward with procedure.   For questions or updates, please contact [facility] Please consult [email or web address] for contact info under Cardiology/STEMI.    Signed, Maryclare Nydam,   PA-C [facility] Pager: [phone] 02/03/2023, 11:47 AM  

## 2023-02-03 NOTE — Progress Notes (Signed)
Progress Note  Patient Name: James Padilla Date of Encounter: 02/03/2023  Primary Cardiologist: New - consult by [facility]  Subjective   No chest pain, dyspnea, palpitations, dizziness, presyncope, or syncope.  Blood pressure has improved into the 120s bpm.  Echo this admission showed a new cardiomyopathy with an EF of 30 to 35% with global hypokinesis, grade 1 diastolic dysfunction, mild to moderate mitral regurgitation, and moderate to severe aortic insufficiency.  Documented urine output 2.1 L for the admission.  Inpatient Medications    Scheduled Meds:  aspirin EC  81 mg Oral Daily   atenolol  25 mg Oral Daily   atorvastatin  10 mg Oral Daily   divalproex  250 mg Oral Q12H   enoxaparin (LOVENOX) injection  40 mg Subcutaneous Q24H   fluticasone furoate-vilanterol  1 puff Inhalation Daily   And   umeclidinium bromide  1 puff Inhalation Daily   folic acid  1 mg Oral Daily   furosemide  20 mg Oral Daily   multivitamin with minerals  1 tablet Oral Daily   nicotine  21 mg Transdermal Daily   sodium chloride flush  3 mL Intravenous Q12H   thiamine  100 mg Oral Daily   Or   thiamine  100 mg Intravenous Daily   Continuous Infusions:  sodium chloride     PRN Meds: sodium chloride, labetalol, LORazepam **OR** LORazepam, ondansetron **OR** ondansetron (ZOFRAN) IV, sodium chloride flush, tiZANidine   Vital Signs    Vitals:   02/03/23 0700 02/03/23 0754 02/03/23 0925 02/03/23 0930  BP: 118/70 118/70 124/83   Pulse: 81 63 64 77  Resp: 14 16    Temp:  97.6 F (36.4 C)    TempSrc:  Oral    SpO2: 97% 96%  98%  Weight:      Height:        Intake/Output Summary (Last 24 hours) at 02/03/2023 1147 Last data filed at 02/02/2023 1322 Gross per 24 hour  Intake --  Output 425 ml  Net -425 ml   Filed Weights   02/01/23 1042  Weight: 58.1 kg    Telemetry    Sinus rhythm with sinus bradycardia - Personally Reviewed  ECG    No new tracings - Personally Reviewed  Physical  Exam   GEN: No acute distress.   Neck: No JVD. Cardiac: RRR, II/VI systolic and II.VI diastolic murmur, no rubs, or gallops.  Respiratory: Clear to auscultation bilaterally, with poor inspiratory effort.  GI: Soft, nontender, non-distended.   MS: No edema; No deformity. Neuro:  Alert and oriented x 3; Nonfocal.  Psych: Normal affect.  Labs    Chemistry Recent Labs  Lab 02/01/23 1043 02/02/23 0535 02/03/23 0634  NA 136 138 139  K 3.8 4.1 4.0  CL 108 106 105  CO2 20* 24 25  GLUCOSE 152* 99 100*  BUN 17 26* 27*  CREATININE 1.56* 1.54* 1.60*  CALCIUM 8.6* 8.9 8.8*  PROT 7.6 7.1 7.0  ALBUMIN 3.9 3.7 3.7  AST 112* 45* 33  ALT 72* 49* 42  ALKPHOS 102 90 84  BILITOT 0.7 1.2 1.0  GFRNONAA 51* 52* 50*  ANIONGAP 8 8 9      Hematology Recent Labs  Lab 02/01/23 1043 02/02/23 0535 02/03/23 0634  WBC 6.1 5.8 5.7  RBC 4.26 4.25 4.44  HGB 14.7 14.7 15.0  HCT 43.3 42.2 43.8  MCV 101.6* 99.3 98.6  MCH 34.5* 34.6* 33.8  MCHC 33.9 34.8 34.2  RDW 12.7 12.2 12.6  PLT 164 159 163    Cardiac EnzymesNo results for input(s): "TROPONINI" in the last 168 hours. No results for input(s): "TROPIPOC" in the last 168 hours.   BNP Recent Labs  Lab 02/01/23 1041  BNP 842.2*     DDimer No results for input(s): "DDIMER" in the last 168 hours.   Radiology     CXR 02/01/2023: IMPRESSION: Pulmonary venous congestion with bilateral basilar predominant reticular opacities, favored to represent pulmonary edema, though atypical/viral infection is a differential consideration.  US RENAL  Result Date: 02/01/2023 IMPRESSION: No collecting system dilatation Electronically Signed   By: Karen Kays M.D.   On: 02/01/2023 17:10    Cardiac Studies   2D echo 02/02/2023: 1. Left ventricular ejection fraction, by estimation, is 30 to 35%. The  left ventricle has moderately decreased function. The left ventricle  demonstrates global hypokinesis. There is moderate left ventricular  hypertrophy.  Left ventricular diastolic  parameters are consistent with Grade I diastolic dysfunction (impaired  relaxation). The average left ventricular global longitudinal strain is  -10.3 %.   2. Right ventricular systolic function is normal. The right ventricular  size is normal. Tricuspid regurgitation signal is inadequate for assessing  PA pressure.   3. The mitral valve is normal in structure. Mild to moderate mitral valve  regurgitation. No evidence of mitral stenosis.   4. The aortic valve is normal in structure. There is mild calcification  of the aortic valve. Aortic valve regurgitation is moderate to severe.  Aortic valve sclerosis/calcification is present, without any evidence of  aortic stenosis.   5. The inferior vena cava is normal in size with greater than 50%  respiratory variability, suggesting right atrial pressure of 3 mmHg.   Patient Profile     57 y.o. male with history of cocaine use, COPD, aortic atherosclerosis, HTN, cervical myelopathy with neuropathy, depression, and irritability and anger who we are evaluating for acute HFrEF and elevated troponin.  Assessment & Plan    1.  Acute HFrEF: -Etiology and chronicity uncertain -Cannot exclude ischemic etiology at this time given multiple risk factors including ongoing tobacco and cocaine use -Possibly hypertensive in etiology -Recommend R/LHC on 6/11, however patient does not agree to this at this time indicated he wants to discuss this further with his family -Optimize GDMT with transition of atenolol to carvedilol, and if BP allows addition of BiDil with further escalation of GDMT as tolerated moving forward throughout the admission -CHF education -Daily weights, strict I's and O's  2.  Elevated high-sensitivity troponin: -Mildly elevated and flat trending, not consistent with ACS -No indication for heparin drip -Recommend pursuing diagnostic R/LHC given cardiomyopathy as outlined above -Aspirin, atorvastatin,  carvedilol  3.  Valvular heart disease: -Optimize medical therapy, pursue cath as outlined above  4.  Polysubstance abuse: -Ongoing cocaine and tobacco use -Complete cessation is recommended  5.  Medication noncompliance: -Not taking medications in the outpatient setting as directed -Compliance recommended   Shared Decision Making/Informed Consent{  The risks [stroke (1 in 1000), death (1 in 1000), kidney failure [usually temporary] (1 in 500), bleeding (1 in 200), allergic reaction [possibly serious] (1 in 200)], benefits (diagnostic support and management of coronary artery disease) and alternatives of a cardiac catheterization were discussed in detail with Mr. Carion and he wants to discuss further with family prior to moving forward with procedure.   For questions or updates, please contact [facility] Please consult [email or web address] for contact info under Cardiology/STEMI.    Signed, Eula Listen,  PA-C [facility] Pager: [phone] 02/03/2023, 11:47 AM

## 2023-02-04 ENCOUNTER — Encounter
Admission: EM | Disposition: A | Discharge status: Home / Self Care | Payer: Self-pay | Source: Home / Self Care | Attending: Internal Medicine

## 2023-02-04 ENCOUNTER — Other Ambulatory Visit (HOSPITAL_COMMUNITY): Payer: Self-pay

## 2023-02-04 DIAGNOSIS — I5021 Acute systolic (congestive) heart failure: Secondary | ICD-10-CM

## 2023-02-04 HISTORY — PX: RIGHT/LEFT HEART CATH AND CORONARY ANGIOGRAPHY: CATH118266

## 2023-02-04 LAB — POCT I-STAT EG7
Acid-Base Excess: 3 mmol/L — ABNORMAL HIGH (ref 0.0–2.0)
Bicarbonate: 27.6 mmol/L (ref 20.0–28.0)
Calcium, Ion: 1.18 mmol/L (ref 1.15–1.40)
HCT: 42 % (ref 39.0–52.0)
Hemoglobin: 14.3 g/dL (ref 13.0–17.0)
O2 Saturation: 73 %
Potassium: 3.7 mmol/L (ref 3.5–5.1)
Sodium: 138 mmol/L (ref 135–145)
TCO2: 29 mmol/L (ref 22–32)
pCO2, Ven: 40.2 mmHg — ABNORMAL LOW (ref 44–60)
pH, Ven: 7.445 — ABNORMAL HIGH (ref 7.25–7.43)
pO2, Ven: 37 mmHg (ref 32–45)

## 2023-02-04 LAB — POCT I-STAT 7, (LYTES, BLD GAS, ICA,H+H)
Acid-Base Excess: 2 mmol/L (ref 0.0–2.0)
Bicarbonate: 24.2 mmol/L (ref 20.0–28.0)
Calcium, Ion: 1.17 mmol/L (ref 1.15–1.40)
HCT: 43 % (ref 39.0–52.0)
Hemoglobin: 14.6 g/dL (ref 13.0–17.0)
O2 Saturation: 97 %
Potassium: 3.8 mmol/L (ref 3.5–5.1)
Sodium: 138 mmol/L (ref 135–145)
TCO2: 25 mmol/L (ref 22–32)
pCO2 arterial: 30.5 mmHg — ABNORMAL LOW (ref 32–48)
pH, Arterial: 7.507 — ABNORMAL HIGH (ref 7.35–7.45)
pO2, Arterial: 82 mmHg — ABNORMAL LOW (ref 83–108)

## 2023-02-04 LAB — CULTURE, BLOOD (ROUTINE X 2)
Culture: NO GROWTH
Culture: NO GROWTH

## 2023-02-04 SURGERY — RIGHT/LEFT HEART CATH AND CORONARY ANGIOGRAPHY
Anesthesia: Moderate Sedation

## 2023-02-04 MED ORDER — HEPARIN SODIUM (PORCINE) 1000 UNIT/ML IJ SOLN
INTRAMUSCULAR | Status: AC
  Filled 2023-02-04: qty 10

## 2023-02-04 MED ORDER — SODIUM CHLORIDE 0.9% FLUSH
3.0000 mL | Freq: Two times a day (BID) | INTRAVENOUS | Status: DC
  Administered 2023-02-04 – 2023-02-05 (×2): 3 mL via INTRAVENOUS

## 2023-02-04 MED ORDER — SODIUM CHLORIDE 0.9 % IV BOLUS
INTRAVENOUS | Status: DC | PRN
  Administered 2023-02-04: 250 mL via INTRAVENOUS

## 2023-02-04 MED ORDER — HEPARIN (PORCINE) IN NACL 1000-0.9 UT/500ML-% IV SOLN
INTRAVENOUS | Status: DC | PRN
  Administered 2023-02-04: 1000 mL

## 2023-02-04 MED ORDER — FENTANYL CITRATE (PF) 100 MCG/2ML IJ SOLN
INTRAMUSCULAR | Status: AC
  Filled 2023-02-04: qty 2

## 2023-02-04 MED ORDER — MIDAZOLAM HCL 2 MG/2ML IJ SOLN
INTRAMUSCULAR | Status: DC | PRN
  Administered 2023-02-04: 1 mg via INTRAVENOUS

## 2023-02-04 MED ORDER — SODIUM CHLORIDE 0.9 % IV SOLN: 250.0000 mL | INTRAVENOUS | Status: DC | PRN

## 2023-02-04 MED ORDER — LIDOCAINE HCL 1 % IJ SOLN
INTRAMUSCULAR | Status: AC
  Filled 2023-02-04: qty 20

## 2023-02-04 MED ORDER — SODIUM CHLORIDE 0.9 % IV SOLN: INTRAVENOUS | Status: AC

## 2023-02-04 MED ORDER — LIDOCAINE HCL (PF) 1 % IJ SOLN
INTRAMUSCULAR | Status: DC | PRN
  Administered 2023-02-04: 5 mL

## 2023-02-04 MED ORDER — ATORVASTATIN CALCIUM 20 MG PO TABS
40.0000 mg | ORAL_TABLET | Freq: Every day | ORAL | Status: DC
  Administered 2023-02-04: 40 mg via ORAL
  Filled 2023-02-04: qty 2

## 2023-02-04 MED ORDER — SODIUM CHLORIDE 0.9% FLUSH: 3.0000 mL | INTRAVENOUS | Status: DC | PRN

## 2023-02-04 MED ORDER — ISOSORB DINITRATE-HYDRALAZINE 20-37.5 MG PO TABS
1.0000 | ORAL_TABLET | Freq: Three times a day (TID) | ORAL | Status: DC
  Administered 2023-02-04 – 2023-02-05 (×3): 1 via ORAL
  Filled 2023-02-04 (×4): qty 1

## 2023-02-04 MED ORDER — IOHEXOL 300 MG/ML  SOLN
INTRAMUSCULAR | Status: DC | PRN
  Administered 2023-02-04: 30 mL

## 2023-02-04 MED ORDER — FENTANYL CITRATE (PF) 100 MCG/2ML IJ SOLN
INTRAMUSCULAR | Status: DC | PRN
  Administered 2023-02-04: 25 ug via INTRAVENOUS

## 2023-02-04 MED ORDER — HEPARIN (PORCINE) IN NACL 1000-0.9 UT/500ML-% IV SOLN
INTRAVENOUS | Status: AC
  Filled 2023-02-04: qty 1000

## 2023-02-04 MED ORDER — VERAPAMIL HCL 2.5 MG/ML IV SOLN
INTRAVENOUS | Status: DC | PRN
  Administered 2023-02-04: 2.5 mg via INTRA_ARTERIAL

## 2023-02-04 MED ORDER — ACETAMINOPHEN 325 MG PO TABS: 650.0000 mg | ORAL_TABLET | ORAL | Status: DC | PRN

## 2023-02-04 MED ORDER — MIDAZOLAM HCL 2 MG/2ML IJ SOLN
INTRAMUSCULAR | Status: AC
  Filled 2023-02-04: qty 2

## 2023-02-04 MED ORDER — VERAPAMIL HCL 2.5 MG/ML IV SOLN
INTRAVENOUS | Status: AC
  Filled 2023-02-04: qty 2

## 2023-02-04 MED ORDER — HEPARIN SODIUM (PORCINE) 1000 UNIT/ML IJ SOLN
INTRAMUSCULAR | Status: DC | PRN
  Administered 2023-02-04: 3000 [IU] via INTRAVENOUS

## 2023-02-04 SURGICAL SUPPLY — 12 items
CATH BALLN WEDGE 5F 110CM (CATHETERS) IMPLANT
CATH INFINITI 5FR JK (CATHETERS) IMPLANT
DEVICE RAD TR BAND REGULAR (VASCULAR PRODUCTS) IMPLANT
DRAPE BRACHIAL (DRAPES) IMPLANT
GLIDESHEATH SLEND SS 6F .021 (SHEATH) IMPLANT
GUIDEWIRE INQWIRE 1.5J.035X260 (WIRE) IMPLANT
INQWIRE 1.5J .035X260CM (WIRE) ×1
PACK CARDIAC CATH (CUSTOM PROCEDURE TRAY) ×1 IMPLANT
PROTECTION STATION PRESSURIZED (MISCELLANEOUS) ×1
SET ATX-X65L (MISCELLANEOUS) IMPLANT
SHEATH GLIDE SLENDER 4/5FR (SHEATH) IMPLANT
STATION PROTECTION PRESSURIZED (MISCELLANEOUS) IMPLANT

## 2023-02-04 NOTE — Progress Notes (Signed)
Progress Note  Patient Name: James Padilla Date of Encounter: 02/04/2023  Primary Cardiologist: New - consult by [facility]  Subjective   Right and left cardiac catheterization was performed today.  Coronary angiography showed moderate nonobstructive three-vessel coronary artery disease.  Right heart catheterization showed low filling pressures, normal pulmonary pressure and normal cardiac output.  The patient was mildly hypotensive throughout the procedure.   Inpatient Medications    Scheduled Meds:  [MAR Hold] aspirin EC  81 mg Oral Daily   atorvastatin  40 mg Oral QHS   [MAR Hold] carvedilol  3.125 mg Oral BID WC   [MAR Hold] divalproex  250 mg Oral Q12H   [MAR Hold] enoxaparin (LOVENOX) injection  40 mg Subcutaneous Q24H   [MAR Hold] fluticasone furoate-vilanterol  1 puff Inhalation Daily   And   [MAR Hold] umeclidinium bromide  1 puff Inhalation Daily   [MAR Hold] folic acid  1 mg Oral Daily   isosorbide-hydrALAZINE  1 tablet Oral TID   [MAR Hold] multivitamin with minerals  1 tablet Oral Daily   [MAR Hold] nicotine  21 mg Transdermal Daily   sodium chloride flush  3 mL Intravenous Q12H   [MAR Hold] thiamine  100 mg Oral Daily   Or   [MAR Hold] thiamine  100 mg Intravenous Daily   Continuous Infusions:  [MAR Hold] sodium chloride     sodium chloride 75 mL/hr at 02/04/23 1142   sodium chloride     PRN Meds: [MAR Hold] sodium chloride, sodium chloride, acetaminophen, [MAR Hold] LORazepam **OR** [MAR Hold] LORazepam, [MAR Hold] ondansetron **OR** [MAR Hold] ondansetron (ZOFRAN) IV, sodium chloride flush, [MAR Hold] tiZANidine   Vital Signs    Vitals:   02/04/23 0927 02/04/23 1115 02/04/23 1130 02/04/23 1148  BP: 113/80 128/71 107/80 99/77  Pulse: 71 67 74 (!) 57  Resp: 20 20 14 14   Temp: 97.7 F (36.5 C)     TempSrc: Oral     SpO2: 98% 98% 98% 96%  Weight:      Height:        Intake/Output Summary (Last 24 hours) at 02/04/2023 1246 Last data filed at 02/04/2023  1122 Gross per 24 hour  Intake 58.53 ml  Output 425 ml  Net -366.47 ml    Filed Weights   02/01/23 1042  Weight: 58.1 kg    Telemetry    Sinus rhythm with sinus bradycardia - Personally Reviewed  ECG    No new tracings - Personally Reviewed  Physical Exam   GEN: No acute distress.   Neck: No JVD. Cardiac: RRR, II/VI systolic and II.VI diastolic murmur, no rubs, or gallops.  Respiratory: Clear to auscultation bilaterally, with poor inspiratory effort.  GI: Soft, nontender, non-distended.   MS: No edema; No deformity. Neuro:  Alert and oriented x 3; Nonfocal.  Psych: Normal affect.  Labs    Chemistry Recent Labs  Lab 02/01/23 1043 02/02/23 0535 02/03/23 0634 02/04/23 1035  NA 136 138 139 138  K 3.8 4.1 4.0 3.8  CL 108 106 105  --   CO2 20* 24 25  --   GLUCOSE 152* 99 100*  --   BUN 17 26* 27*  --   CREATININE 1.56* 1.54* 1.60*  --   CALCIUM 8.6* 8.9 8.8*  --   PROT 7.6 7.1 7.0  --   ALBUMIN 3.9 3.7 3.7  --   AST 112* 45* 33  --   ALT 72* 49* 42  --   ALKPHOS  102 90 84  --   BILITOT 0.7 1.2 1.0  --   GFRNONAA 51* 52* 50*  --   ANIONGAP 8 8 9   --       Hematology Recent Labs  Lab 02/01/23 1043 02/02/23 0535 02/03/23 0634 02/04/23 1035  WBC 6.1 5.8 5.7  --   RBC 4.26 4.25 4.44  --   HGB 14.7 14.7 15.0 14.6  HCT 43.3 42.2 43.8 43.0  MCV 101.6* 99.3 98.6  --   MCH 34.5* 34.6* 33.8  --   MCHC 33.9 34.8 34.2  --   RDW 12.7 12.2 12.6  --   PLT 164 159 163  --      Cardiac EnzymesNo results for input(s): "TROPONINI" in the last 168 hours. No results for input(s): "TROPIPOC" in the last 168 hours.   BNP Recent Labs  Lab 02/01/23 1041  BNP 842.2*      DDimer No results for input(s): "DDIMER" in the last 168 hours.   Radiology     CXR 02/01/2023: IMPRESSION: Pulmonary venous congestion with bilateral basilar predominant reticular opacities, favored to represent pulmonary edema, though atypical/viral infection is a differential  consideration.  US RENAL  Result Date: 02/01/2023 IMPRESSION: No collecting system dilatation Electronically Signed   By: Karen Kays M.D.   On: 02/01/2023 17:10    Cardiac Studies   2D echo 02/02/2023: 1. Left ventricular ejection fraction, by estimation, is 30 to 35%. The  left ventricle has moderately decreased function. The left ventricle  demonstrates global hypokinesis. There is moderate left ventricular  hypertrophy. Left ventricular diastolic  parameters are consistent with Grade I diastolic dysfunction (impaired  relaxation). The average left ventricular global longitudinal strain is  -10.3 %.   2. Right ventricular systolic function is normal. The right ventricular  size is normal. Tricuspid regurgitation signal is inadequate for assessing  PA pressure.   3. The mitral valve is normal in structure. Mild to moderate mitral valve  regurgitation. No evidence of mitral stenosis.   4. The aortic valve is normal in structure. There is mild calcification  of the aortic valve. Aortic valve regurgitation is moderate to severe.  Aortic valve sclerosis/calcification is present, without any evidence of  aortic stenosis.   5. The inferior vena cava is normal in size with greater than 50%  respiratory variability, suggesting right atrial pressure of 3 mmHg.   Patient Profile     57 y.o. male with history of cocaine use, COPD, aortic atherosclerosis, HTN, cervical myelopathy with neuropathy, depression, and irritability and anger who we are evaluating for acute HFrEF and elevated troponin.  Assessment & Plan    1.  Acute HFrEF: This is due to nonischemic cardiomyopathy likely hypertensive heart disease.  He denies excessive alcohol use. Continue small dose carvedilol and BiDil if blood pressure tolerates. I discontinued his furosemide after cardiac catheterization given that his filling pressures were low and he was mildly hypertensive. Consider resuming a smaller dose of furosemide 20  mg daily tomorrow.  2.  Elevated high-sensitivity troponin: This is due to supply demand mismatch. -Cardiac catheterization showed moderate three-vessel coronary artery disease.  Continue aspirin.  I increased atorvastatin.  3.  Moderate to severe aortic insufficiency: This will have to be monitored by serial echocardiograms as an outpatient.  4.  Polysubstance abuse: -Ongoing cocaine and tobacco use -Complete cessation is recommended  The patient should be monitored another day and likely discharge home tomorrow if he remains stable.   Signed, Lorine Bears, MD  [facility] 02/04/2023, 12:46 PM

## 2023-02-04 NOTE — Interval H&P Note (Signed)
History and Physical Interval Note:  02/04/2023 10:15 AM  James Padilla  has presented today for surgery, with the diagnosis of Acute systolic heart failure.  The various methods of treatment have been discussed with the patient and family. After consideration of risks, benefits and other options for treatment, the patient has consented to  Procedure(s): RIGHT/LEFT HEART CATH AND CORONARY ANGIOGRAPHY (N/A) as a surgical intervention.  The patient's history has been reviewed, patient examined, no change in status, stable for surgery.  I have reviewed the patient's chart and labs.  Questions were answered to the patient's satisfaction.     Lorine Bears

## 2023-02-04 NOTE — TOC Benefit Eligibility Note (Signed)
Patient Advocate Encounter  Insurance verification completed.    The patient is currently admitted and upon discharge could be taking Farxiga 10 mg.  The current 30 day co-pay is $0.00.   The patient is currently admitted and upon discharge could be taking Jardiance 10 mg.  The current 30 day co-pay is $0.00.   The patient is insured through [employer] Medicare Part D   This test claim was processed through [facility]- copay amounts may vary at other pharmacies due to pharmacy/plan contracts, or as the patient moves through the different stages of their insurance plan.  Daija Routson, CPHT Pharmacy Patient Advocate Specialist  Pharmacy Patient Advocate Team Direct Number: [phone]  Fax: [phone]       

## 2023-02-04 NOTE — Plan of Care (Signed)

## 2023-02-05 ENCOUNTER — Encounter: Payer: Self-pay | Admitting: Cardiovascular Disease

## 2023-02-05 DIAGNOSIS — I160 Hypertensive urgency: Secondary | ICD-10-CM | POA: Diagnosis not present

## 2023-02-05 DIAGNOSIS — I428 Other cardiomyopathies: Secondary | ICD-10-CM | POA: Diagnosis not present

## 2023-02-05 DIAGNOSIS — J810 Acute pulmonary edema: Secondary | ICD-10-CM

## 2023-02-05 DIAGNOSIS — I161 Hypertensive emergency: Secondary | ICD-10-CM

## 2023-02-05 DIAGNOSIS — I214 Non-ST elevation (NSTEMI) myocardial infarction: Secondary | ICD-10-CM

## 2023-02-05 LAB — CULTURE, BLOOD (ROUTINE X 2)

## 2023-02-05 MED ORDER — FUROSEMIDE 20 MG PO TABS: 20.0000 mg | ORAL_TABLET | Freq: Every day | ORAL | 0 refills | Status: DC

## 2023-02-05 MED ORDER — ATORVASTATIN CALCIUM 40 MG PO TABS: 40.0000 mg | ORAL_TABLET | Freq: Every day | ORAL | 0 refills | Status: DC

## 2023-02-05 MED ORDER — ISOSORB DINITRATE-HYDRALAZINE 20-37.5 MG PO TABS: 1.0000 | ORAL_TABLET | Freq: Three times a day (TID) | ORAL | 0 refills | Status: AC

## 2023-02-05 MED ORDER — FUROSEMIDE 20 MG PO TABS
20.0000 mg | ORAL_TABLET | Freq: Every day | ORAL | Status: DC
  Administered 2023-02-05: 20 mg via ORAL
  Filled 2023-02-05: qty 1

## 2023-02-05 MED ORDER — CARVEDILOL 3.125 MG PO TABS: 3.1250 mg | ORAL_TABLET | Freq: Two times a day (BID) | ORAL | 0 refills | Status: AC

## 2023-02-05 NOTE — Discharge Summary (Signed)
Physician Discharge Summary  James Padilla:[id number] DOB: August 04, 1966 DOA: 02/01/2023  PCP: Alba Cory, MD  Admit date: 02/01/2023 Discharge date: 02/05/2023  Admitted From: home  Disposition:  home   Recommendations for Outpatient Follow-up:  Follow up with PCP in 1-2 weeks F/u w/ cardio, Dr. Mariah Milling, in 1-2 weeks   Home Health: no  Equipment/Devices:)  Discharge Condition: stable  CODE STATUS: full  Diet recommendation: Heart Healthy  Brief/Interim Summary: HPI was taken from Dr. Alvester Morin: James Padilla is a 57 y.o. male with medical history significant of hypertension, emphysema, substance abuse with cocaine, depression, coronary artery disease, presenting with hypertensive urgency, cardiomyopathy, NSTEMI.  Patient reports increased work of breathing over the past 1 to 2 weeks.  Positive orthopnea, PND and lower extremity swelling.  Baseline crack cocaine use.  Patient reports heavy crack use just about every other day.  Also drinks 1-2 shots of E&J  on a fairly regular basis.  Worsening shortness of breath despite symptoms.  No chest pain.  No nausea or vomiting.  No hemiparesis or confusion.  Patient ambiguous about salt intake.  Had significantly worsening shortness of breath over the past 12 to 24 hours.  1 pack/day smoker.  No reported wheezing or cough. Presented to the ER afebrile, heart rate 100s, initially requiring BiPAP and transition to 2 L nasal cannula to room air after IV Lasix.  White count 6.1, hemoglobin 14.7, platelets 164, urine drug screen positive for cocaine, troponin 53 and 55, creatinine 1.56.  AST 112, ALT 72.  Chest x-ray with pulmonary vascular congestion with bilateral basilar reticular opacities concerning for pulmonary edema versus atypical infection.  EKG normal sinus rhythm.   Discharge Diagnoses:  Principal Problem:   Hypertensive urgency Active Problems:   Cardiomyopathy (HCC)   CAD in native artery   Tobacco abuse counseling   Centrilobular  emphysema (HCC)   Cocaine abuse (HCC)   ETOH abuse   AKI (acute kidney injury) (HCC)   Hypertensive emergency   Acute pulmonary edema (HCC)   Elevated troponin   Acute systolic heart failure (HCC)   Non-ST elevation (NSTEMI) myocardial infarction [facility])  Hypertensive urgency: initially started on IV nitro drip but has since been weaned off. Possibly secondary to cocaine use. Urgency resolved but still w/ HTN. Continue on corag, bidil, lasix as per cardio   Nonischemic Cardiomyopathy: s/p cardiac cath that showed filling pressures were low. Continue on coreg, bidil, lasix as per cardio. Will need outpatient f/u w/ cardio   Flash pulmonary edema: initially required BiPAP but has since been weaned off. Continue on lasix. Monitor I/Os. Resolved   Cocaine use: received illicit drug use cessation counseling. Does not want resources to help himself quit   Elevated troponins: minimally elevated. Likely demand ischemia as per cardio. NSTEMI r/o. Cardio recs apprec   AKI: Cr is labile. US renal shows no collecting system dilatation.    Alcohol abuse: drinks 1-2 shots of TMJ daily as per admitting physician. Alcohol cessation counseling. Continue on CIWA protocol   Transaminitis: likely secondary to illicit drug use & alcohol abuse. Resolved    Centrilobular emphysema: w/o exacerbation. Bronchodilators prn    Tobacco abuse: received cessation counseling already. Nicotine patch to prevent w/drawal  Discharge Instructions  Discharge Instructions     Diet - low sodium heart healthy   Complete by: As directed    Discharge instructions   Complete by: As directed    F/u w/ PCP in 1-2 weeks. F/u w/ cardio, Dr. Mariah Milling,  in 1-2 weeks   Increase activity slowly   Complete by: As directed       Allergies as of 02/05/2023       Reactions   Septra [sulfamethoxazole-trimethoprim] Other (See Comments)   Renal Failure   Lactose Nausea And Vomiting        Medication List     STOP taking  these medications    atenolol 25 MG tablet Commonly known as: TENORMIN   losartan-hydrochlorothiazide 100-12.5 MG tablet Commonly known as: HYZAAR       TAKE these medications    atorvastatin 40 MG tablet Commonly known as: LIPITOR Take 1 tablet (40 mg total) by mouth at bedtime. What changed:  medication strength how much to take when to take this additional instructions   carvedilol 3.125 MG tablet Commonly known as: COREG Take 1 tablet (3.125 mg total) by mouth 2 (two) times daily with a meal.   divalproex 250 MG DR tablet Commonly known as: Depakote Take 1 tablet (250 mg total) by mouth in the morning and at bedtime. For mood   EQ Aspirin Adult Low Dose 81 MG tablet Generic drug: aspirin EC Take 1 tablet by mouth once daily   fluticasone 50 MCG/ACT nasal spray Commonly known as: FLONASE Place 2 sprays into both nostrils daily.   furosemide 20 MG tablet Commonly known as: LASIX Take 1 tablet (20 mg total) by mouth daily. Start taking on: February 06, 2023   isosorbide-hydrALAZINE 20-37.5 MG tablet Commonly known as: BIDIL Take 1 tablet by mouth 3 (three) times daily.   tiZANidine 2 MG tablet Commonly known as: ZANAFLEX Take 1 tablet (2 mg total) by mouth 2 (two) times daily as needed for muscle spasms.   Trelegy Ellipta 100-62.5-25 MCG/ACT Aepb Generic drug: Fluticasone-Umeclidin-Vilant Inhale 1 puff into the lungs daily.   triamcinolone cream 0.1 % Commonly known as: KENALOG Apply 1 application topically 2 (two) times daily.        Follow-up Information     Gollan, Tollie Pizza, MD Follow up.   Specialty: Cardiology Why: F/u in 1-2 weeks Contact information: [address] [place] [address] [phone]                Allergies  Allergen Reactions   Septra [Sulfamethoxazole-Trimethoprim] Other (See Comments)    Renal Failure   Lactose Nausea And Vomiting    Consultations: Cardio    Procedures/Studies: CARDIAC  CATHETERIZATION  Result Date: 02/04/2023   Prox RCA lesion is 40% stenosed.   Prox RCA to Mid RCA lesion is 40% stenosed.   Mid Cx to Dist Cx lesion is 50% stenosed.   2nd Mrg lesion is 40% stenosed.   Mid LAD lesion is 60% stenosed.   Dist LAD lesion is 60% stenosed. 1.  Moderate nonobstructive three-vessel coronary artery disease. 2.  Left ventricular angiography was not performed.  EF was moderately reduced by echo. 3.  Right heart catheterization showed low filling pressures, normal pulmonary pressure and normal cardiac output. Recommendations: The patient has nonischemic cardiomyopathy but does have underlying coronary artery disease.  Recommend aggressive medical therapy of risk factors.  I increased his atorvastatin.  No need for revascularization. I discontinued furosemide as he seems to be mildly volume depleted.  ECHOCARDIOGRAM COMPLETE  Result Date: 02/02/2023    ECHOCARDIOGRAM REPORT   Patient Name:   James Padilla Date of Exam: 02/02/2023 Medical Rec #:  [id number]     Height:       64.0 in  Accession #:    [id number]    Weight:       128.0 lb Date of Birth:  12/08/1965     BSA:          1.618 m Patient Age:    56 years      BP:           138/86 mmHg Patient Gender: M             HR:           85 bpm. Exam Location:  [facility] Procedure: 2D Echo, Color Doppler, Cardiac Doppler and Intracardiac            Opacification Agent Indications:     I50.21 Acute Systolic CHF  History:         Patient has no prior history of Echocardiogram examinations.                  CAD, COPD; Risk Factors:Hypertension and Cocaine Use.  Sonographer:     L. Thornton-Maynard Referring Phys:  [id number] Francoise Schaumann [place] Diagnosing Phys: Julien Nordmann MD  Sonographer Comments: Image acquisition challenging due to respiratory motion. IMPRESSIONS  1. Left ventricular ejection fraction, by estimation, is 30 to 35%. The left ventricle has moderately decreased function. The left ventricle demonstrates global hypokinesis. There is moderate left  ventricular hypertrophy. Left ventricular diastolic parameters are consistent with Grade I diastolic dysfunction (impaired relaxation). The average left ventricular global longitudinal strain is -10.3 %.  2. Right ventricular systolic function is normal. The right ventricular size is normal. Tricuspid regurgitation signal is inadequate for assessing PA pressure.  3. The mitral valve is normal in structure. Mild to moderate mitral valve regurgitation. No evidence of mitral stenosis.  4. The aortic valve is normal in structure. There is mild calcification of the aortic valve. Aortic valve regurgitation is moderate to severe. Aortic valve sclerosis/calcification is present, without any evidence of aortic stenosis.  5. The inferior vena cava is normal in size with greater than 50% respiratory variability, suggesting right atrial pressure of 3 mmHg. FINDINGS  Left Ventricle: Left ventricular ejection fraction, by estimation, is 30 to 35%. The left ventricle has moderately decreased function. The left ventricle demonstrates global hypokinesis. Definity contrast agent was given IV to delineate the left ventricular endocardial borders. The average left ventricular global longitudinal strain is -10.3 %. The left ventricular internal cavity size was normal in size. There is moderate left ventricular hypertrophy. Left ventricular diastolic parameters are consistent with Grade I diastolic dysfunction (impaired relaxation). Right Ventricle: The right ventricular size is normal. No increase in right ventricular wall thickness. Right ventricular systolic function is normal. Tricuspid regurgitation signal is inadequate for assessing PA pressure. Left Atrium: Left atrial size was normal in size. Right Atrium: Right atrial size was normal in size. Pericardium: There is no evidence of pericardial effusion. Mitral Valve: The mitral valve is normal in structure. There is moderate thickening of the mitral valve leaflet(s). Mild to  moderate mitral valve regurgitation. No evidence of mitral valve stenosis. MV peak gradient, 3.6 mmHg. The mean mitral valve gradient is 2.0 mmHg. Tricuspid Valve: The tricuspid valve is normal in structure. Tricuspid valve regurgitation is mild . No evidence of tricuspid stenosis. Aortic Valve: The aortic valve is normal in structure. There is mild calcification of the aortic valve. Aortic valve regurgitation is moderate to severe. Aortic regurgitation PHT measures 479 msec. Aortic valve sclerosis/calcification is present, without  any evidence of aortic stenosis. Aortic valve mean  gradient measures 5.5 mmHg. Aortic valve peak gradient measures 9.7 mmHg. Aortic valve area, by VTI measures 1.04 cm. Pulmonic Valve: The pulmonic valve was normal in structure. Pulmonic valve regurgitation is not visualized. No evidence of pulmonic stenosis. Aorta: The aortic root is normal in size and structure. Venous: The inferior vena cava is normal in size with greater than 50% respiratory variability, suggesting right atrial pressure of 3 mmHg. IAS/Shunts: No atrial level shunt detected by color flow Doppler.  LEFT VENTRICLE PLAX 2D LVIDd:         4.80 cm      Diastology LVIDs:         4.10 cm      LV e' medial:    6.96 cm/s LV PW:         1.30 cm      LV E/e' medial:  4.3 LV IVS:        1.40 cm      LV e' lateral:   5.77 cm/s LVOT diam:     2.10 cm      LV E/e' lateral: 5.2 LV SV:         26 LV SV Index:   16           2D Longitudinal Strain LVOT Area:     3.46 cm     2D Strain GLS Avg:     -10.3 %  LV Volumes (MOD) LV vol d, MOD A2C: 112.0 ml LV vol d, MOD A4C: 155.0 ml LV vol s, MOD A2C: 64.7 ml LV vol s, MOD A4C: 138.0 ml LV SV MOD A2C:     47.3 ml LV SV MOD A4C:     155.0 ml LV SV MOD BP:      37.2 ml RIGHT VENTRICLE             IVC RV Basal diam:  2.80 cm     IVC diam: 1.40 cm RV S prime:     13.70 cm/s TAPSE (M-mode): 2.0 cm LEFT ATRIUM             Index        RIGHT ATRIUM           Index LA diam:        3.30 cm 2.04  cm/m   RA Area:     12.70 cm LA Vol (A2C):   51.4 ml 31.76 ml/m  RA Volume:   29.20 ml  18.04 ml/m LA Vol (A4C):   32.9 ml 20.33 ml/m LA Biplane Vol: 42.0 ml 25.95 ml/m  AORTIC VALVE                     PULMONIC VALVE AV Area (Vmax):    1.32 cm      PV Vmax:       0.89 m/s AV Area (Vmean):   1.42 cm      PV Peak grad:  3.2 mmHg AV Area (VTI):     1.04 cm AV Vmax:           156.00 cm/s AV Vmean:          110.500 cm/s AV VTI:            0.246 m AV Peak Grad:      9.7 mmHg AV Mean Grad:      5.5 mmHg LVOT Vmax:         59.50 cm/s LVOT Vmean:        45.200 cm/s  LVOT VTI:          0.074 m LVOT/AV VTI ratio: 0.30 AI PHT:            479 msec  AORTA Ao Root diam: 3.60 cm MITRAL VALVE MV Area (PHT): 4.68 cm       SHUNTS MV Area VTI:   1.37 cm       Systemic VTI:  0.07 m MV Peak grad:  3.6 mmHg       Systemic Diam: 2.10 cm MV Mean grad:  2.0 mmHg MV Vmax:       0.95 m/s MV Vmean:      60.4 cm/s MV Decel Time: 162 msec MR Peak grad:    86.1 mmHg MR Mean grad:    53.0 mmHg MR Vmax:         464.00 cm/s MR Vmean:        336.0 cm/s MR PISA:         0.25 cm MR PISA Eff ROA: 2 mm MR PISA Radius:  0.20 cm MV E velocity: 29.90 cm/s MV A velocity: 57.50 cm/s MV E/A ratio:  0.52 Julien Nordmann MD Electronically signed by Julien Nordmann MD Signature Date/Time: 02/02/2023/11:22:04 AM    Final    US RENAL  Result Date: 02/01/2023 CLINICAL DATA:  Acute kidney injury EXAM: RENAL / URINARY TRACT ULTRASOUND COMPLETE COMPARISON:  None Available. FINDINGS: Right Kidney: Renal measurements: 8.5 x 3.9 x 5.8 cm = volume: 101.6 mL. Echogenicity within normal limits. No mass or hydronephrosis visualized. Left Kidney: Renal measurements: 9.7 x 4.8 x 5.0 cm = volume: 122 mL. Echogenicity within normal limits. No mass or hydronephrosis visualized. Bladder: Appears normal for degree of bladder distention. Other: None. IMPRESSION: No collecting system dilatation Electronically Signed   By: Karen Kays M.D.   On: 02/01/2023 17:10   DG  Chest Port 1 View  Result Date: 02/01/2023 CLINICAL DATA:  Questionable sepsis, evaluate for abnormality. Respiratory distress. EXAM: PORTABLE CHEST 1 VIEW COMPARISON:  09/01/2021. FINDINGS: Pulmonary venous congestion with bilateral basilar predominant reticular opacities, favored to represent pulmonary edema, though atypical/viral infection is a differential consideration. No pleural effusion or pneumothorax. Stable cardiac and mediastinal contours. IMPRESSION: Pulmonary venous congestion with bilateral basilar predominant reticular opacities, favored to represent pulmonary edema, though atypical/viral infection is a differential consideration. Electronically Signed   By: Orvan Falconer M.D.   On: 02/01/2023 11:48   (Echo, Carotid, EGD, Colonoscopy, ERCP)    Subjective:   Discharge Exam: Vitals:   02/05/23 0812 02/05/23 1210  BP: 129/88 113/64  Pulse: 71 84  Resp: 16 16  Temp: (!) 97.5 F (36.4 C) 98.2 F (36.8 C)  SpO2: 99% 100%   Vitals:   02/04/23 2308 02/05/23 0400 02/05/23 0812 02/05/23 1210  BP: 115/68 99/61 129/88 113/64  Pulse: 88 80 71 84  Resp: 18 16 16 16   Temp: 97.8 F (36.6 C) 97.7 F (36.5 C) (!) 97.5 F (36.4 C) 98.2 F (36.8 C)  TempSrc: Oral Oral    SpO2: 99% 98% 99% 100%  Weight:      Height:        General: Pt is alert, awake, not in acute distress Cardiovascular:S1/S2 +, no rubs, no gallops Respiratory: CTA bilaterally, no wheezing, no rhonchi Abdominal: Soft, NT, ND, bowel sounds + Extremities: no cyanosis    The results of significant diagnostics from this hospitalization (including imaging, microbiology, ancillary and laboratory) are listed below for reference.     Microbiology: Recent Results (from  the past 240 hour(s))  Blood Culture (routine x 2)     Status: None (Preliminary result)   Collection Time: 02/01/23 10:42 AM   Specimen: BLOOD  Result Value Ref Range Status   Specimen Description BLOOD LEFT ANTECUBITAL  Final   Special  Requests   Final    BOTTLES DRAWN AEROBIC AND ANAEROBIC Blood Culture adequate volume   Culture   Final    NO GROWTH 4 DAYS Performed at [facility], [address] [place], [place] [address]    Report Status PENDING  Incomplete  Blood Culture (routine x 2)     Status: None (Preliminary result)   Collection Time: 02/01/23 10:42 AM   Specimen: BLOOD  Result Value Ref Range Status   Specimen Description BLOOD BLOOD RIGHT FOREARM  Final   Special Requests   Final    BOTTLES DRAWN AEROBIC AND ANAEROBIC Blood Culture results may not be optimal due to an excessive volume of blood received in culture bottles   Culture   Final    NO GROWTH 4 DAYS Performed at [facility], [address] [place], [place] [address]    Report Status PENDING  Incomplete     Labs: BNP (last 3 results) Recent Labs    02/01/23 1041  BNP 842.2*   Basic Metabolic Panel: Recent Labs  Lab 02/01/23 1043 02/02/23 0535 02/03/23 0634 02/04/23 1035 02/04/23 1039  NA 136 138 139 138 138  K 3.8 4.1 4.0 3.8 3.7  CL 108 106 105  --   --   CO2 20* 24 25  --   --   GLUCOSE 152* 99 100*  --   --   BUN 17 26* 27*  --   --   CREATININE 1.56* 1.54* 1.60*  --   --   CALCIUM 8.6* 8.9 8.8*  --   --    Liver Function Tests: Recent Labs  Lab 02/01/23 1043 02/02/23 0535 02/03/23 0634  AST 112* 45* 33  ALT 72* 49* 42  ALKPHOS 102 90 84  BILITOT 0.7 1.2 1.0  PROT 7.6 7.1 7.0  ALBUMIN 3.9 3.7 3.7   No results for input(s): "LIPASE", "AMYLASE" in the last 168 hours. No results for input(s): "AMMONIA" in the last 168 hours. CBC: Recent Labs  Lab 02/01/23 1043 02/02/23 0535 02/03/23 0634 02/04/23 1035 02/04/23 1039  WBC 6.1 5.8 5.7  --   --   NEUTROABS 1.6*  --   --   --   --   HGB 14.7 14.7 15.0 14.6 14.3  HCT 43.3 42.2 43.8 43.0 42.0  MCV 101.6* 99.3 98.6  --   --   PLT 164 159 163  --   --    Cardiac Enzymes: No results for input(s): "CKTOTAL", "CKMB", "CKMBINDEX", "TROPONINI"  in the last 168 hours. BNP: Invalid input(s): "POCBNP" CBG: No results for input(s): "GLUCAP" in the last 168 hours. D-Dimer No results for input(s): "DDIMER" in the last 72 hours. Hgb A1c No results for input(s): "HGBA1C" in the last 72 hours. Lipid Profile No results for input(s): "CHOL", "HDL", "LDLCALC", "TRIG", "CHOLHDL", "LDLDIRECT" in the last 72 hours. Thyroid function studies No results for input(s): "TSH", "T4TOTAL", "T3FREE", "THYROIDAB" in the last 72 hours.  Invalid input(s): "FREET3" Anemia work up No results for input(s): "VITAMINB12", "FOLATE", "FERRITIN", "TIBC", "IRON", "RETICCTPCT" in the last 72 hours. Urinalysis    Component Value Date/Time   COLORURINE COLORLESS (A) 02/01/2023 1241   APPEARANCEUR CLEAR (A) 02/01/2023 1241  LABSPEC 1.005 02/01/2023 1241   PHURINE 6.0 02/01/2023 1241   GLUCOSEU NEGATIVE 02/01/2023 1241   HGBUR NEGATIVE 02/01/2023 1241   BILIRUBINUR NEGATIVE 02/01/2023 1241   BILIRUBINUR negative 10/05/2018 1225   KETONESUR NEGATIVE 02/01/2023 1241   PROTEINUR NEGATIVE 02/01/2023 1241   UROBILINOGEN negative (A) 10/05/2018 1225   NITRITE NEGATIVE 02/01/2023 1241   LEUKOCYTESUR NEGATIVE 02/01/2023 1241   Sepsis Labs Recent Labs  Lab 02/01/23 1043 02/02/23 0535 02/03/23 0634  WBC 6.1 5.8 5.7   Microbiology Recent Results (from the past 240 hour(s))  Blood Culture (routine x 2)     Status: None (Preliminary result)   Collection Time: 02/01/23 10:42 AM   Specimen: BLOOD  Result Value Ref Range Status   Specimen Description BLOOD LEFT ANTECUBITAL  Final   Special Requests   Final    BOTTLES DRAWN AEROBIC AND ANAEROBIC Blood Culture adequate volume   Culture   Final    NO GROWTH 4 DAYS Performed at [facility], [address] [place], [place] [address]    Report Status PENDING  Incomplete  Blood Culture (routine x 2)     Status: None (Preliminary result)   Collection Time: 02/01/23 10:42 AM   Specimen: BLOOD  Result  Value Ref Range Status   Specimen Description BLOOD BLOOD RIGHT FOREARM  Final   Special Requests   Final    BOTTLES DRAWN AEROBIC AND ANAEROBIC Blood Culture results may not be optimal due to an excessive volume of blood received in culture bottles   Culture   Final    NO GROWTH 4 DAYS Performed at [facility], [address], [place], [place] [address]    Report Status PENDING  Incomplete     Time coordinating discharge: Over 30 minutes  SIGNED:   Charise Killian, MD  [facility] 02/05/2023, 1:23 PM Pager   If 7PM-7AM, please contact night-coverage [email or web address]

## 2023-02-05 NOTE — Progress Notes (Signed)
James Padilla  A and O x 4. VSS. Pt tolerating diet well. No complaints of pain or nausea. IV removed intact, prescriptions given. Pt voiced understanding of discharge instructions with no further questions. Pt discharged via wheelchair.     Allergies as of 02/05/2023       Reactions   Septra [sulfamethoxazole-trimethoprim] Other (See Comments)   Renal Failure   Lactose Nausea And Vomiting        Medication List     STOP taking these medications    atenolol 25 MG tablet Commonly known as: TENORMIN   losartan-hydrochlorothiazide 100-12.5 MG tablet Commonly known as: HYZAAR       TAKE these medications    atorvastatin 40 MG tablet Commonly known as: LIPITOR Take 1 tablet (40 mg total) by mouth at bedtime. What changed:  medication strength how much to take when to take this additional instructions   carvedilol 3.125 MG tablet Commonly known as: COREG Take 1 tablet (3.125 mg total) by mouth 2 (two) times daily with a meal.   divalproex 250 MG DR tablet Commonly known as: Depakote Take 1 tablet (250 mg total) by mouth in the morning and at bedtime. For mood   EQ Aspirin Adult Low Dose 81 MG tablet Generic drug: aspirin EC Take 1 tablet by mouth once daily   fluticasone 50 MCG/ACT nasal spray Commonly known as: FLONASE Place 2 sprays into both nostrils daily.   furosemide 20 MG tablet Commonly known as: LASIX Take 1 tablet (20 mg total) by mouth daily. Start taking on: February 06, 2023   isosorbide-hydrALAZINE 20-37.5 MG tablet Commonly known as: BIDIL Take 1 tablet by mouth 3 (three) times daily.   tiZANidine 2 MG tablet Commonly known as: ZANAFLEX Take 1 tablet (2 mg total) by mouth 2 (two) times daily as needed for muscle spasms.   Trelegy Ellipta 100-62.5-25 MCG/ACT Aepb Generic drug: Fluticasone-Umeclidin-Vilant Inhale 1 puff into the lungs daily.   triamcinolone cream 0.1 % Commonly known as: KENALOG Apply 1 application topically 2 (two) times  daily.        Vitals:   02/05/23 0812 02/05/23 1210  BP: 129/88 113/64  Pulse: 71 84  Resp: 16 16  Temp: (!) 97.5 F (36.4 C) 98.2 F (36.8 C)  SpO2: 99% 100%    Suzzanne Cloud

## 2023-02-05 NOTE — Progress Notes (Signed)
Transition of Care [facility]) - Inpatient Brief Assessment   Patient Details  Name: PARRISH BONN MRN: [id number] Date of Birth: 1965-11-10  Transition of Care [facility]) CM/SW Contact:    Truddie Hidden, RN Phone Number: 02/05/2023, 1:42 PM   Clinical Narrative: TOC continues to provide ongoing  assessment for needs and discharge planning.    Transition of Care Asessment: Insurance and Status: Insurance coverage has been reviewed Patient has primary care physician: Yes Home environment has been reviewed: Return to home Prior level of function:: Independent Prior/Current Home Services: No current home services Social Determinants of Health Reivew: SDOH reviewed no interventions necessary Readmission risk has been reviewed: Yes Transition of care needs: no transition of care needs at this time

## 2023-02-05 NOTE — Progress Notes (Signed)
Progress Note  Patient Name: James Padilla Date of Encounter: 02/05/2023  Primary Cardiologist: New - consult by [facility]  Subjective   Right and left cardiac catheterization was performed 6/11 showed moderate nonobstructive three-vessel coronary artery disease.  Right heart catheterization showed low filling pressures, normal pulmonary pressure and normal cardiac output.    No symptoms of angina or cardiac decompensation.   Inpatient Medications    Scheduled Meds:  aspirin EC  81 mg Oral Daily   atorvastatin  40 mg Oral QHS   carvedilol  3.125 mg Oral BID WC   divalproex  250 mg Oral Q12H   enoxaparin (LOVENOX) injection  40 mg Subcutaneous Q24H   fluticasone furoate-vilanterol  1 puff Inhalation Daily   And   umeclidinium bromide  1 puff Inhalation Daily   folic acid  1 mg Oral Daily   isosorbide-hydrALAZINE  1 tablet Oral TID   multivitamin with minerals  1 tablet Oral Daily   nicotine  21 mg Transdermal Daily   sodium chloride flush  3 mL Intravenous Q12H   thiamine  100 mg Oral Daily   Or   thiamine  100 mg Intravenous Daily   Continuous Infusions:  sodium chloride     sodium chloride     PRN Meds: sodium chloride, sodium chloride, acetaminophen, ondansetron **OR** ondansetron (ZOFRAN) IV, sodium chloride flush, tiZANidine   Vital Signs    Vitals:   02/04/23 2006 02/04/23 2308 02/05/23 0400 02/05/23 0812  BP: 106/71 115/68 99/61 129/88  Pulse: 77 88 80 71  Resp: 20 18 16 16   Temp: 98.4 F (36.9 C) 97.8 F (36.6 C) 97.7 F (36.5 C) (!) 97.5 F (36.4 C)  TempSrc:  Oral Oral   SpO2: 99% 99% 98% 99%  Weight:      Height:        Intake/Output Summary (Last 24 hours) at 02/05/2023 0837 Last data filed at 02/05/2023 0814 Gross per 24 hour  Intake 997.5 ml  Output 2000 ml  Net -1002.5 ml    Filed Weights   02/01/23 1042  Weight: 58.1 kg    Telemetry    Sinus rhythm with sinus bradycardia - Personally Reviewed  ECG    No new tracings -  Personally Reviewed  Physical Exam   GEN: No acute distress.   Neck: No JVD. Cardiac: RRR, II/VI systolic and II/VI diastolic murmur, no rubs, or gallops.  Respiratory: Clear to auscultation bilaterally, with poor inspiratory effort.  GI: Soft, nontender, non-distended.   MS: No edema; No deformity. Neuro:  Alert and oriented x 3; Nonfocal.  Psych: Normal affect.  Labs    Chemistry Recent Labs  Lab 02/01/23 1043 02/02/23 0535 02/03/23 0634 02/04/23 1035 02/04/23 1039  NA 136 138 139 138 138  K 3.8 4.1 4.0 3.8 3.7  CL 108 106 105  --   --   CO2 20* 24 25  --   --   GLUCOSE 152* 99 100*  --   --   BUN 17 26* 27*  --   --   CREATININE 1.56* 1.54* 1.60*  --   --   CALCIUM 8.6* 8.9 8.8*  --   --   PROT 7.6 7.1 7.0  --   --   ALBUMIN 3.9 3.7 3.7  --   --   AST 112* 45* 33  --   --   ALT 72* 49* 42  --   --   ALKPHOS 102 90 84  --   --  BILITOT 0.7 1.2 1.0  --   --   GFRNONAA 51* 52* 50*  --   --   ANIONGAP 8 8 9   --   --       Hematology Recent Labs  Lab 02/01/23 1043 02/02/23 0535 02/03/23 0634 02/04/23 1035 02/04/23 1039  WBC 6.1 5.8 5.7  --   --   RBC 4.26 4.25 4.44  --   --   HGB 14.7 14.7 15.0 14.6 14.3  HCT 43.3 42.2 43.8 43.0 42.0  MCV 101.6* 99.3 98.6  --   --   MCH 34.5* 34.6* 33.8  --   --   MCHC 33.9 34.8 34.2  --   --   RDW 12.7 12.2 12.6  --   --   PLT 164 159 163  --   --      Cardiac EnzymesNo results for input(s): "TROPONINI" in the last 168 hours. No results for input(s): "TROPIPOC" in the last 168 hours.   BNP Recent Labs  Lab 02/01/23 1041  BNP 842.2*      DDimer No results for input(s): "DDIMER" in the last 168 hours.   Radiology     CXR 02/01/2023: IMPRESSION: Pulmonary venous congestion with bilateral basilar predominant reticular opacities, favored to represent pulmonary edema, though atypical/viral infection is a differential consideration.  US RENAL  Result Date: 02/01/2023 IMPRESSION: No collecting system dilatation  Electronically Signed   By: Karen Kays M.D.   On: 02/01/2023 17:10    Cardiac Studies   2D echo 02/02/2023: 1. Left ventricular ejection fraction, by estimation, is 30 to 35%. The  left ventricle has moderately decreased function. The left ventricle  demonstrates global hypokinesis. There is moderate left ventricular  hypertrophy. Left ventricular diastolic  parameters are consistent with Grade I diastolic dysfunction (impaired  relaxation). The average left ventricular global longitudinal strain is  -10.3 %.   2. Right ventricular systolic function is normal. The right ventricular  size is normal. Tricuspid regurgitation signal is inadequate for assessing  PA pressure.   3. The mitral valve is normal in structure. Mild to moderate mitral valve  regurgitation. No evidence of mitral stenosis.   4. The aortic valve is normal in structure. There is mild calcification  of the aortic valve. Aortic valve regurgitation is moderate to severe.  Aortic valve sclerosis/calcification is present, without any evidence of  aortic stenosis.   5. The inferior vena cava is normal in size with greater than 50%  respiratory variability, suggesting right atrial pressure of 3 mmHg.  __________  [facility] 02/04/2023:   Prox RCA lesion is 40% stenosed.   Prox RCA to Mid RCA lesion is 40% stenosed.   Mid Cx to Dist Cx lesion is 50% stenosed.   2nd Mrg lesion is 40% stenosed.   Mid LAD lesion is 60% stenosed.   Dist LAD lesion is 60% stenosed.   1.  Moderate nonobstructive three-vessel coronary artery disease. 2.  Left ventricular angiography was not performed.  EF was moderately reduced by echo. 3.  Right heart catheterization showed low filling pressures, normal pulmonary pressure and normal cardiac output.   Recommendations: The patient has nonischemic cardiomyopathy but does have underlying coronary artery disease.  Recommend aggressive medical therapy of risk factors.  I increased his atorvastatin.  No  need for revascularization. I discontinued furosemide as he seems to be mildly volume depleted.  Patient Profile     57 y.o. male with history of cocaine use, COPD, aortic atherosclerosis, HTN, cervical  myelopathy with neuropathy, depression, and irritability and anger who we are evaluating for acute HFrEF and elevated troponin.  Assessment & Plan    1.  Acute HFrEF: -This is due to nonischemic cardiomyopathy, likely hypertensive heart disease.   -He denies excessive alcohol use. Continue small dose carvedilol and BiDil if blood pressure tolerates. -Furosemide has been held following cardiac catheterization given that his filling pressures were low and he was mildly hypertensive. -Continue Coreg and BiDil -Restart furosemide 20 mg daily. -Not currently on MRA with renal dysfunction.  -Consider SGLT2i in the outpatient setting (deferred during admission due to renal dysfunction) - co-pay of $0. -Post cath instructions.  -Cardiac rehab.   2.  Elevated high-sensitivity troponin:  -This is due to supply demand mismatch. -Cardiac catheterization showed moderate three-vessel coronary artery disease.   -Continue aspirin and titrated dose of atorvastatin.  3.  Moderate to severe aortic insufficiency:  -This will have to be monitored by serial echocardiograms as an outpatient.  4.  Polysubstance abuse: -Ongoing cocaine and tobacco use. -Complete cessation is recommended.  5. CKD stage IIIa: -Monitor.    Signed, Eula Listen, PA-C [facility] 02/05/2023, 8:37 AM

## 2023-02-06 ENCOUNTER — Telehealth: Payer: Self-pay | Admitting: *Deleted

## 2023-02-06 ENCOUNTER — Telehealth (HOSPITAL_BASED_OUTPATIENT_CLINIC_OR_DEPARTMENT_OTHER): Payer: Self-pay | Admitting: *Deleted

## 2023-02-06 NOTE — Transitions of Care (Post Inpatient/ED Visit) (Signed)
02/06/2023  Name: JAN OLANO MRN: [id number] DOB: 1965-09-17  Today's TOC FU Call Status: Today's TOC FU Call Status:: Successful TOC FU Call Competed TOC FU Call Complete Date: 02/06/23  Transition Care Management Follow-up Telephone Call Date of Discharge: 02/05/23 Discharge Facility: [facility]) Type of Discharge: Inpatient Admission Primary Inpatient Discharge Diagnosis:: Hypertensive urgency How have you been since you were released from the hospital?: Better  Items Reviewed: Did you receive and understand the discharge instructions provided?: Yes Medications obtained,verified, and reconciled?: Yes (Medications Reviewed) Any new allergies since your discharge?: No Dietary orders reviewed?: No Do you have support at home?: Yes People in Home: alone Name of Support/Comfort Primary Source: Casimiro Needle  Medications Reviewed Today: Medications Reviewed Today     Reviewed by Luella Cook, RN (Case Manager) on 02/06/23 at 1228  Med List Status: <None>   Medication Order Taking? Sig Documenting Provider Last Dose Status Informant  atorvastatin (LIPITOR) 40 MG tablet [id number] Yes Take 1 tablet (40 mg total) by mouth at bedtime. Charise Killian, MD Taking Active   carvedilol (COREG) 3.125 MG tablet [id number] Yes Take 1 tablet (3.125 mg total) by mouth 2 (two) times daily with a meal. Charise Killian, MD Taking Active   divalproex (DEPAKOTE) 250 MG DR tablet [id number] Yes Take 1 tablet (250 mg total) by mouth in the morning and at bedtime. For mood Alba Cory, MD Taking Active Self  EQ ASPIRIN ADULT LOW DOSE 81 MG EC tablet [id number] Yes Take 1 tablet by mouth once daily Margarita Mail, DO Taking Active Self  fluticasone (FLONASE) 50 MCG/ACT nasal spray [id number] Yes Place 2 sprays into both nostrils daily. Alba Cory, MD Taking Active Self  Fluticasone-Umeclidin-Vilant (TRELEGY ELLIPTA) 100-62.5-25 MCG/ACT AEPB [id number] Yes  Inhale 1 puff into the lungs daily. Alba Cory, MD Taking Active Self  furosemide (LASIX) 20 MG tablet [id number] Yes Take 1 tablet (20 mg total) by mouth daily. Charise Killian, MD Taking Active   isosorbide-hydrALAZINE (BIDIL) 20-37.5 MG tablet [id number] Yes Take 1 tablet by mouth 3 (three) times daily. Charise Killian, MD Taking Active   tiZANidine (ZANAFLEX) 2 MG tablet [id number] Yes Take 1 tablet (2 mg total) by mouth 2 (two) times daily as needed for muscle spasms. Alba Cory, MD Taking Active Self  triamcinolone cream (KENALOG) 0.1 % [id number] No Apply 1 application topically 2 (two) times daily.  Patient not taking: Reported on 02/06/2023   Alba Cory, MD Not Taking Active Self            Home Care and Equipment/Supplies: Were Home Health Services Ordered?: NA Any new equipment or medical supplies ordered?: NA  Functional Questionnaire: Do you need assistance with bathing/showering or dressing?: No Do you need assistance with meal preparation?: No Do you need assistance with eating?: No Do you have difficulty maintaining continence: No Do you need assistance with getting out of bed/getting out of a chair/moving?: No Do you have difficulty managing or taking your medications?: No  Follow up appointments reviewed: PCP Follow-up appointment confirmed?: Yes Date of PCP follow-up appointment?: 02/11/23 Follow-up Provider: Henry Russel Specialist [facility] Follow-up appointment confirmed?: No Reason Specialist Follow-Up Not Confirmed: Patient has Specialist Provider Number and will Call for Appointment Do you need transportation to your follow-up appointment?: No Do you understand care options if your condition(s) worsen?: Yes-patient verbalized understanding  SDOH Interventions Today    Flowsheet Row Most Recent Value  SDOH Interventions   Food Insecurity Interventions Intervention Not  Indicated      Interventions Today    Flowsheet Row Most Recent  Value  General Interventions   General Interventions Discussed/Reviewed General Interventions Discussed, General Interventions Reviewed, Doctor Visits  [Patient declined referral to Care Coordinator]  Doctor Visits Discussed/Reviewed Doctor Visits Discussed, Doctor Visits Reviewed  Pharmacy Interventions   Pharmacy Dicussed/Reviewed Pharmacy Topics Discussed      TOC Interventions Today    Flowsheet Row Most Recent Value  TOC Interventions   TOC Interventions Discussed/Reviewed Arranged PCP follow up within 7 days/Care Guide scheduled       Gean Maidens BSN RN [facility] [phone]

## 2023-02-07 LAB — CULTURE, BLOOD (ROUTINE X 2): Special Requests: ADEQUATE

## 2023-02-11 ENCOUNTER — Ambulatory Visit (INDEPENDENT_AMBULATORY_CARE_PROVIDER_SITE_OTHER): Payer: 59 | Admitting: Family Medicine

## 2023-02-11 ENCOUNTER — Encounter: Payer: Self-pay | Admitting: Family Medicine

## 2023-02-11 VITALS — BP 94/60 | HR 78 | Resp 16 | Ht 64.0 in | Wt 129.9 lb

## 2023-02-11 DIAGNOSIS — Z09 Encounter for follow-up examination after completed treatment for conditions other than malignant neoplasm: Secondary | ICD-10-CM

## 2023-02-11 DIAGNOSIS — J432 Centrilobular emphysema: Secondary | ICD-10-CM | POA: Diagnosis not present

## 2023-02-11 DIAGNOSIS — I429 Cardiomyopathy, unspecified: Secondary | ICD-10-CM | POA: Diagnosis not present

## 2023-02-11 DIAGNOSIS — J810 Acute pulmonary edema: Secondary | ICD-10-CM

## 2023-02-11 DIAGNOSIS — N179 Acute kidney failure, unspecified: Secondary | ICD-10-CM | POA: Diagnosis not present

## 2023-02-11 DIAGNOSIS — I2510 Atherosclerotic heart disease of native coronary artery without angina pectoris: Secondary | ICD-10-CM

## 2023-02-11 DIAGNOSIS — E785 Hyperlipidemia, unspecified: Secondary | ICD-10-CM | POA: Insufficient documentation

## 2023-02-11 DIAGNOSIS — F1410 Cocaine abuse, uncomplicated: Secondary | ICD-10-CM

## 2023-02-11 DIAGNOSIS — Z5181 Encounter for therapeutic drug level monitoring: Secondary | ICD-10-CM | POA: Diagnosis not present

## 2023-02-11 DIAGNOSIS — I5021 Acute systolic (congestive) heart failure: Secondary | ICD-10-CM | POA: Diagnosis not present

## 2023-02-11 DIAGNOSIS — I160 Hypertensive urgency: Secondary | ICD-10-CM | POA: Diagnosis not present

## 2023-02-11 NOTE — Progress Notes (Signed)
Name: James Padilla   MRN: [id number]    DOB: [birth year]   Date:02/11/2023       Progress Note  Chief Complaint  Patient presents with   Hospitalization Follow-up     Subjective:   James Padilla is a 57 y.o. male, presents to clinic for hospital follow up  Here for hospital follow up/transition of care.  Admit date: 02/01/2023 Discharge date: 02/05/2023 Transition of care was initiated previously by [facility] on 02/06/2023 and med changes, diagnosis, specialist follow ups and pts symptoms and condition were all reviewed.  Pt was admitted for hypertensive urgency, cardiomyopathy and NSTEMI  New medications started per hospitalization include : START taking: carvedilol (COREG) furosemide (LASIX) Start taking on: February 06, 2023 isosorbide-hydrALAZINE (BIDIL) CHANGE how you take: atorvastatin (LIPITOR) - DOSE increased per cardiology after cath STOP taking: atenolol 25 MG tablet (TENORMIN) losartan-hydrochlorothiazide 100-12.5 MG tablet (HYZAAR)  Hypertension:   BP Readings from Last 3 Encounters:  02/11/23 94/60  02/05/23 113/64  02/01/23 (!) 233/132   Cardiac sx: Pt denies CP, SOB, exertional sx, LE edema, palpitation, Ha's, visual disturbances, lightheadedness, hypotension, syncope.  02/02/2023 eGFR 52 Lab Results  Component Value Date   EGFR 58 (L) 08/05/2022   LFTs mildly elevated inpt but improved from 6/8 to 6/10 prior to discharge Lab Results  Component Value Date   ALT 42 02/03/2023   AST 33 02/03/2023   ALKPHOS 84 02/03/2023   BILITOT 1.0 02/03/2023   Troponin trended down on 02/01/2023    Labs due today are - recheck CMP with lasix (potassium?) Pt feels uch better breathing and chest, but back is hurting  Reviewed inpt ECHO And cath:  02/04/2023:   Prox RCA lesion is 40% stenosed.   Prox RCA to Mid RCA lesion is 40% stenosed.   Mid Cx to Dist Cx lesion is 50% stenosed.   2nd Mrg lesion is 40% stenosed.   Mid LAD lesion is 60% stenosed.    Dist LAD lesion is 60% stenosed.   1.  Moderate nonobstructive three-vessel coronary artery disease. 2.  Left ventricular angiography was not performed.  EF was moderately reduced by echo. 3.  Right heart catheterization showed low filling pressures, normal pulmonary pressure and normal cardiac output.   Recommendations: The patient has nonischemic cardiomyopathy but does have underlying coronary artery disease.  Recommend aggressive medical therapy of risk factors.  I increased his atorvastatin.  No need for revascularization. I discontinued furosemide as he seems to be mildly volume depleted.  ECHO 02/02/2023 IMPRESSIONS     1. Left ventricular ejection fraction, by estimation, is 30 to 35%. The  left ventricle has moderately decreased function. The left ventricle  demonstrates global hypokinesis. There is moderate left ventricular  hypertrophy. Left ventricular diastolic  parameters are consistent with Grade I diastolic dysfunction (impaired  relaxation). The average left ventricular global longitudinal strain is  -10.3 %.   2. Right ventricular systolic function is normal. The right ventricular  size is normal. Tricuspid regurgitation signal is inadequate for assessing  PA pressure.   3. The mitral valve is normal in structure. Mild to moderate mitral valve  regurgitation. No evidence of mitral stenosis.   4. The aortic valve is normal in structure. There is mild calcification  of the aortic valve. Aortic valve regurgitation is moderate to severe.  Aortic valve sclerosis/calcification is present, without any evidence of  aortic stenosis.   5. The inferior vena cava is normal in size with greater than 50%  respiratory variability, suggesting right atrial pressure of 3 mmHg.    Weight stable, no LE edema Wt Readings from Last 5 Encounters:  02/11/23 129 lb 14.4 oz (58.9 kg)  02/01/23 128 lb (58.1 kg)  01/30/23 128 lb (58.1 kg)  08/05/22 134 lb 4.8 oz (60.9 kg)  06/04/22 123 lb  (55.8 kg)   BMI Readings from Last 5 Encounters:  02/11/23 22.30 kg/m  02/01/23 21.97 kg/m  01/30/23 21.97 kg/m  08/05/22 22.35 kg/m  06/04/22 20.47 kg/m        Current Outpatient Medications:    atorvastatin (LIPITOR) 40 MG tablet, Take 1 tablet (40 mg total) by mouth at bedtime., Disp: 30 tablet, Rfl: 0   carvedilol (COREG) 3.125 MG tablet, Take 1 tablet (3.125 mg total) by mouth 2 (two) times daily with a meal., Disp: 60 tablet, Rfl: 0   divalproex (DEPAKOTE) 250 MG DR tablet, Take 1 tablet (250 mg total) by mouth in the morning and at bedtime. For mood, Disp: 180 tablet, Rfl: 1   EQ ASPIRIN ADULT LOW DOSE 81 MG EC tablet, Take 1 tablet by mouth once daily, Disp: 30 tablet, Rfl: 0   fluticasone (FLONASE) 50 MCG/ACT nasal spray, Place 2 sprays into both nostrils daily., Disp: 16 g, Rfl: 2   Fluticasone-Umeclidin-Vilant (TRELEGY ELLIPTA) 100-62.5-25 MCG/ACT AEPB, Inhale 1 puff into the lungs daily., Disp: 3 each, Rfl: 1   furosemide (LASIX) 20 MG tablet, Take 1 tablet (20 mg total) by mouth daily., Disp: 30 tablet, Rfl: 0   isosorbide-hydrALAZINE (BIDIL) 20-37.5 MG tablet, Take 1 tablet by mouth 3 (three) times daily., Disp: 90 tablet, Rfl: 0   tiZANidine (ZANAFLEX) 2 MG tablet, Take 1 tablet (2 mg total) by mouth 2 (two) times daily as needed for muscle spasms., Disp: 180 tablet, Rfl: 1   triamcinolone cream (KENALOG) 0.1 %, Apply 1 application topically 2 (two) times daily. (Patient not taking: Reported on 02/06/2023), Disp: 30 g, Rfl: 0  Patient Active Problem List   Diagnosis Date Noted   Non-ST elevation (NSTEMI) myocardial infarction (HCC) 02/05/2023   Acute systolic heart failure (HCC) 02/04/2023   Hypertensive emergency 02/02/2023   Acute pulmonary edema (HCC) 02/02/2023   Elevated troponin 02/02/2023   Hypertensive urgency 02/01/2023   Cardiomyopathy (HCC) 02/01/2023   ETOH abuse 02/01/2023   AKI (acute kidney injury) (HCC) 02/01/2023   Cocaine abuse (HCC)  01/10/2022   Cervical myelopathy (HCC) 01/10/2022   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CC) 01/10/2022   Centrilobular emphysema (HCC) 10/04/2020   Atherosclerosis of aorta (HCC) 10/04/2020   CAD in native artery 10/04/2020   Neuropathy 08/29/2020   Hypertension, benign 01/04/2019   Sebaceous cyst 12/17/2018   Nonintractable episodic headache 10/05/2018   Tobacco abuse counseling 10/05/2018   Irritability and anger 10/05/2018   Chronic neck pain 09/19/2018   Chronic bilateral back pain 09/19/2018   Right arm pain 08/14/2018    Past Surgical History:  Procedure Laterality Date   COLONOSCOPY WITH PROPOFOL N/A 10/15/2018   Procedure: COLONOSCOPY WITH PROPOFOL;  Surgeon: Wyline Mood, MD;  Location: [facility] ENDOSCOPY;  Service: Gastroenterology;  Laterality: N/A;   MULTIPLE TOOTH EXTRACTIONS     RIGHT/LEFT HEART CATH AND CORONARY ANGIOGRAPHY N/A 02/04/2023   Procedure: RIGHT/LEFT HEART CATH AND CORONARY ANGIOGRAPHY;  Surgeon: Iran Ouch, MD;  Location: [facility] INVASIVE CV LAB;  Service: Cardiovascular;  Laterality: N/A;    Family History  Problem Relation Age of Onset   Breast cancer Mother    Aneurysm Father  Social History   Tobacco Use   Smoking status: Every Day    Packs/day: 0.75    Years: 40.00    Additional pack years: 0.00    Total pack years: 30.00    Types: Cigarettes    Start date: 04/12/1982    Passive exposure: Past   Smokeless tobacco: Never  Vaping Use   Vaping Use: Never used  Substance Use Topics   Alcohol use: Yes    Comment: rarely    Drug use: Yes    Frequency: 2.0 times per week    Types: Marijuana, Cocaine    Comment: crack     Allergies  Allergen Reactions   Septra [Sulfamethoxazole-Trimethoprim] Other (See Comments)    Renal Failure   Lactose Nausea And Vomiting    Health Maintenance  Topic Date Due   Medicare Annual Wellness (AWV)  Never done   Lung Cancer Screening  10/10/2022   INFLUENZA VACCINE  03/27/2023    Colonoscopy  10/15/2025   DTaP/Tdap/Td (2 - Td or Tdap) 10/05/2028   Hepatitis C Screening  Completed   HIV Screening  Completed   Zoster Vaccines- Shingrix  Completed   HPV VACCINES  Aged Out   COVID-19 Vaccine  Discontinued    Chart Review Today: I personally reviewed active problem list, medication list, allergies, family history, social history, health maintenance, notes from last encounter, lab results, imaging with the patient/caregiver today.   Review of Systems  Constitutional: Negative.   HENT: Negative.    Eyes: Negative.   Respiratory: Negative.    Cardiovascular: Negative.   Gastrointestinal: Negative.   Endocrine: Negative.   Genitourinary: Negative.   Musculoskeletal: Negative.   Skin: Negative.   Allergic/Immunologic: Negative.   Neurological: Negative.   Hematological: Negative.   Psychiatric/Behavioral: Negative.    All other systems reviewed and are negative.    Objective:   Vitals:   02/11/23 1427  BP: 94/60  Pulse: 78  Resp: 16  SpO2: 97%  Weight: 129 lb 14.4 oz (58.9 kg)  Height: 5\' 4"  (1.626 m)    Body mass index is 22.3 kg/m.  Physical Exam Vitals and nursing note reviewed.  Constitutional:      General: He is not in acute distress.    Appearance: Normal appearance. He is well-developed. He is not ill-appearing, toxic-appearing or diaphoretic.  HENT:     Head: Normocephalic and atraumatic.     Right Ear: External ear normal.     Left Ear: External ear normal.     Nose: Nose normal.  Eyes:     General:        Right eye: No discharge.        Left eye: No discharge.     Conjunctiva/sclera: Conjunctivae normal.  Neck:     Trachea: No tracheal deviation.  Cardiovascular:     Rate and Rhythm: Normal rate and regular rhythm.     Pulses: Normal pulses.     Heart sounds: Normal heart sounds.  Pulmonary:     Effort: Pulmonary effort is normal. No respiratory distress.     Breath sounds: Normal breath sounds. No stridor. No wheezing,  rhonchi or rales.     Comments: Back brace preventing deep inspiration and auscultation at bases Musculoskeletal:     Right lower leg: No edema.     Left lower leg: No edema.  Skin:    General: Skin is warm and dry.     Findings: No rash.  Neurological:     Mental Status:  He is alert.     Motor: No abnormal muscle tone.     Coordination: Coordination normal.         Assessment & Plan:     ICD-10-CM   1. Encounter for examination following treatment at hospital  Z09 COMPLETE METABOLIC PANEL WITH GFR   thorough review of hospitalization, testing/procedures, discharge summary/plan, meds    2. Encounter for medication monitoring  Z51.81 COMPLETE METABOLIC PANEL WITH GFR    Magnesium    3. Centrilobular emphysema (HCC)  J43.2    breathing sig improved - he is out of inhalers but afraid to use them - he thinks it "set him into" sx that caused him to be hospitalized    4. Drug abuse, cocaine type (HCC)  F14.10     5. Hypertensive urgency  I16.0 COMPLETE METABOLIC PANEL WITH GFR   BP well controlled to soft today    6. Cardiomyopathy, unspecified type (HCC)  I42.9    f/yp cardiology    7. AKI (acute kidney injury) (HCC)  N17.9 COMPLETE METABOLIC PANEL WITH GFR   renal function improved prior to discharge, recheck today since on lasix daily    8. Acute systolic heart failure (HCC)  Z61.09 COMPLETE METABOLIC PANEL WITH GFR    Magnesium    9. Acute pulmonary edema (HCC)  J81.0    portion of lungs that I could examine today were CTA    10. Hyperlipidemia, unspecified hyperlipidemia type  E78.5    on higher dose of atorvastatin, would recheck lipids in 3-4 months with PCP - last lipids were 1-2 years ago    11. Coronary artery disease involving native coronary artery of native heart without angina pectoris  I25.10    multivessel nonobstructing CAD        Pt has f/up with cardiology - Dr. Mariah Milling and pulmonology   With discharge on lasix will recheck electrolytes/renal  function Meds refills to ensure he has enough until cardiology appt - at which time they would manage new meds for new cardiac dx - defer to them BP is a little soft - pt can hold lasix or coreg temporarily if he feels lightheaded or bp is <90/60 He notes cutting back on smoking and drinking  He is compliant with all the meds and has cardiology f/up Sx are much better now     Return in about 3 months (around 05/14/2023) for Routine follow-up Dr Carlynn Purl .   Danelle Berry, PA-C 02/11/23 10:08 AM

## 2023-02-11 NOTE — Patient Instructions (Signed)
Hold the furosemide if you feel too lightheaded or like you may pass out and let us or cardiology know. I will call you with your lab results and any needed changed so your medications

## 2023-02-12 LAB — COMPLETE METABOLIC PANEL WITH GFR
AG Ratio: 1.4 (calc) (ref 1.0–2.5)
ALT: 29 U/L (ref 9–46)
AST: 37 U/L — ABNORMAL HIGH (ref 10–35)
Albumin: 4.4 g/dL (ref 3.6–5.1)
Alkaline phosphatase (APISO): 82 U/L (ref 35–144)
BUN/Creatinine Ratio: 11 (calc) (ref 6–22)
BUN: 21 mg/dL (ref 7–25)
CO2: 31 mmol/L (ref 20–32)
Calcium: 9.2 mg/dL (ref 8.6–10.3)
Chloride: 98 mmol/L (ref 98–110)
Creat: 1.87 mg/dL — ABNORMAL HIGH (ref 0.70–1.30)
Globulin: 3.1 g/dL (calc) (ref 1.9–3.7)
Glucose, Bld: 94 mg/dL (ref 65–99)
Potassium: 4.5 mmol/L (ref 3.5–5.3)
Sodium: 135 mmol/L (ref 135–146)
Total Bilirubin: 0.6 mg/dL (ref 0.2–1.2)
Total Protein: 7.5 g/dL (ref 6.1–8.1)
eGFR: 41 mL/min/{1.73_m2} — ABNORMAL LOW (ref >=60)

## 2023-02-12 LAB — MAGNESIUM: Magnesium: 2.2 mg/dL (ref 1.5–2.5)

## 2023-02-12 NOTE — Addendum Note (Signed)
Addended by: Danelle Berry on: 02/12/2023 05:08 PM   Modules accepted: Orders

## 2023-02-14 ENCOUNTER — Ambulatory Visit: Payer: 59 | Admitting: Internal Medicine

## 2023-02-14 NOTE — Progress Notes (Deleted)
   Acute Office Visit  Subjective:     Patient ID: James Padilla, male    DOB: [birth year], 57 y.o.   MRN: [id number]  No chief complaint on file.   HPI Patient is in today for scrotal swelling.    ROS      Objective:    There were no vitals taken for this visit. {Vitals History (Optional):23777}  Physical Exam  No results found for any visits on 02/14/23.      Assessment & Plan:   Problem List Items Addressed This Visit   None   No orders of the defined types were placed in this encounter.   No follow-ups on file.  Margarita Mail, DO

## 2023-02-18 ENCOUNTER — Other Ambulatory Visit: Payer: Self-pay

## 2023-02-18 ENCOUNTER — Ambulatory Visit: Payer: 59 | Attending: Cardiovascular Disease | Admitting: Cardiovascular Disease

## 2023-02-18 ENCOUNTER — Encounter: Payer: Self-pay | Admitting: Cardiovascular Disease

## 2023-02-18 VITALS — BP 100/60 | HR 77 | Ht 65.5 in | Wt 129.0 lb

## 2023-02-18 DIAGNOSIS — N189 Chronic kidney disease, unspecified: Secondary | ICD-10-CM

## 2023-02-18 DIAGNOSIS — I5021 Acute systolic (congestive) heart failure: Secondary | ICD-10-CM | POA: Diagnosis not present

## 2023-02-18 DIAGNOSIS — I2510 Atherosclerotic heart disease of native coronary artery without angina pectoris: Secondary | ICD-10-CM | POA: Diagnosis not present

## 2023-02-18 MED ORDER — FUROSEMIDE 40 MG PO TABS: 40.0000 mg | ORAL_TABLET | Freq: Every day | ORAL | 6 refills | Status: AC

## 2023-02-18 NOTE — Patient Instructions (Addendum)
Referral to nephrology for chronic renal dysfunction  Cut back on fluid intake  Medication Instructions:  Please increase the lasix/furosemide up to 40 mg daily  If you need a refill on your cardiac medications before your next appointment, please call your pharmacy.   Lab work: No new labs needed  Testing/Procedures: No new testing needed  Follow-Up: At [facility], you and your health needs are our priority.  As part of our continuing mission to provide you with exceptional heart care, we have created designated Provider Care Teams.  These Care Teams include your primary Cardiologist (physician) and Advanced Practice Providers (APPs -  Physician Assistants and Nurse Practitioners) who all work together to provide you with the care you need, when you need it.  You will need a follow up appointment in 6 months  Providers on your designated Care Team:   Nicolasa Ducking, NP Eula Listen, PA-C Cadence Fransico Michael, [place]  COVID-19 Vaccine Information can be found at: [email or web address] For questions related to vaccine distribution or appointments, please email [email or web address] or call [phone].

## 2023-02-18 NOTE — Progress Notes (Signed)
Cardiology Office Note  Date:  02/18/2023   ID:  James Padilla, James Padilla [birth year], MRN [id number]  PCP:  James Cory, MD   Chief Complaint  Patient presents with   James Padilla follow up     Patient c/o bilateral LE edema, chest tightness and shortness of breath at times. Medications reviewed by the patient verbally.     HPI:  [name] is a 57 year old gentleman with  long history of smoking since he was a teenager, COPD,  frequent cocaine/crack abuse, every other day hypertension,  aortic atherosclerosis,  chronic neck and back pain/neuropathy Recent hospitalization with acute shortness of breath, cocaine positive, malignant hypertension Workup including cardiac catheterization showing moderate nonobstructive coronary disease Ejection fraction 30 to 35% on echo February 02, 2023 Presents to establish care in the James Padilla office, follow-up of his hypertension, coronary disease  Recent emergency room and hospitalization records reviewed Presenting to the emergency room February 01, 2023 with acute shortness of breath Reports symptoms were significant, presented to the emergency room via EMS Notes indicating labored respirations, wheezing, given multiple rounds of nebulizers Placed on CPAP for respiratory support Blood pressure in the ER 220 systolic Reports he continues to use cocaine every other day, alcohol regularly " I am done with that" Does not eat out, likes to cook at home Continues to smoke 1 pack/day Reports having a cough which is chronic, nonproductive  right and left heart catheterization June 11 showing moderate nonobstructive three-vessel coronary disease Normal right heart pressures  In follow-up today, unclear of his medications, reports he is taking 4 prescriptions Believes he is taking Lipitor, carvedilol twice daily, may be taking half dose Lasix, taking BiDil 3 times a day Blood pressure low but denies orthostasis symptoms  Reports he is having worsening leg  swelling High fluid intake  EKG personally reviewed by myself on todays visit EKG Interpretation  Date/Time:  Tuesday February 18 2023 16:42:04 EDT Ventricular Rate:  77 PR Interval:  138 QRS Duration: 90 QT Interval:  382 QTC Calculation: 432 R Axis:   -48 Text Interpretation: Normal sinus rhythm Left axis deviation T wave abnormality, consider anterolateral ischemia When compared with ECG of 02-Feb-2023 16:16, Minimal criteria for Septal infarct are no longer Present Confirmed by Julien Nordmann [phone]) on 02/18/2023 4:54:10 PM     PMH:   has a past medical history of Abscess (12/17/2018), Anxiety, Depression, Dyspnea, Hypertension, Neck pain, and Weakness of extremity (Right arm and hand).  PSH:    Past Surgical History:  Procedure Laterality Date   COLONOSCOPY WITH PROPOFOL N/A 10/15/2018   Procedure: COLONOSCOPY WITH PROPOFOL;  Surgeon: Wyline Mood, MD;  Location: [facility] ENDOSCOPY;  Service: Gastroenterology;  Laterality: N/A;   MULTIPLE TOOTH EXTRACTIONS     RIGHT/LEFT HEART CATH AND CORONARY ANGIOGRAPHY N/A 02/04/2023   Procedure: RIGHT/LEFT HEART CATH AND CORONARY ANGIOGRAPHY;  Surgeon: Iran Ouch, MD;  Location: [facility] INVASIVE CV LAB;  Service: Cardiovascular;  Laterality: N/A;    Current Outpatient Medications  Medication Sig Dispense Refill   atorvastatin (LIPITOR) 40 MG tablet Take 1 tablet (40 mg total) by mouth at bedtime. 30 tablet 0   carvedilol (COREG) 3.125 MG tablet Take 1 tablet (3.125 mg total) by mouth 2 (two) times daily with a meal. 60 tablet 0   divalproex (DEPAKOTE) 250 MG DR tablet Take 1 tablet (250 mg total) by mouth in the morning and at bedtime. For mood 180 tablet 1   EQ ASPIRIN ADULT LOW DOSE 81 MG EC tablet  Take 1 tablet by mouth once daily 30 tablet 0   fluticasone (FLONASE) 50 MCG/ACT nasal spray Place 2 sprays into both nostrils daily. 16 g 2   Fluticasone-Umeclidin-Vilant (TRELEGY ELLIPTA) 100-62.5-25 MCG/ACT AEPB Inhale 1 puff into the lungs  daily. 3 each 1   furosemide (LASIX) 20 MG tablet Take 1 tablet (20 mg total) by mouth daily. 30 tablet 0   isosorbide-hydrALAZINE (BIDIL) 20-37.5 MG tablet Take 1 tablet by mouth 3 (three) times daily. 90 tablet 0   tiZANidine (ZANAFLEX) 2 MG tablet Take 1 tablet (2 mg total) by mouth 2 (two) times daily as needed for muscle spasms. 180 tablet 1   triamcinolone cream (KENALOG) 0.1 % Apply 1 application topically 2 (two) times daily. 30 g 0   No current facility-administered medications for this visit.     Allergies:   Septra [sulfamethoxazole-trimethoprim] and Lactose   Social History:  The patient  reports that he quit smoking about 2 weeks ago. His smoking use included cigarettes. He started smoking about 40 years ago. He has a 30.00 pack-year smoking history. He has been exposed to tobacco smoke. He has never used smokeless tobacco. He reports current alcohol use. He reports current drug use. Frequency: 2.00 times per week. Drugs: Marijuana and Cocaine.   Family History:   family history includes Aneurysm in his father; Breast cancer in his mother.    Review of Systems: Review of Systems  Constitutional: Negative.   HENT: Negative.    Respiratory: Negative.    Cardiovascular: Negative.   Gastrointestinal: Negative.   Musculoskeletal: Negative.   Neurological: Negative.   Psychiatric/Behavioral: Negative.    All other systems reviewed and are negative.    PHYSICAL EXAM: VS:  BP 100/60 (BP Location: Left Arm, Patient Position: Sitting, Cuff Size: Normal)   Pulse 77   Ht 5' 5.5" (1.664 m)   Wt 129 lb (58.5 kg)   SpO2 96%   BMI 21.14 kg/m  , BMI Body mass index is 21.14 kg/m. GEN: Well nourished, well developed, in no acute distress HEENT: normal Neck: no JVD, carotid bruits, or masses Cardiac: RRR; no murmurs, rubs, or gallops,no edema  Respiratory:  clear to auscultation bilaterally, normal work of breathing GI: soft, nontender, nondistended, + BS MS: no deformity or  atrophy Skin: warm and dry, no rash Neuro:  Strength and sensation are intact Psych: euthymic mood, full affect  Recent Labs: 02/01/2023: B Natriuretic Peptide 842.2 02/03/2023: Platelets 163 02/04/2023: Hemoglobin 14.3 02/11/2023: ALT 29; BUN 21; Creat 1.87; Magnesium 2.2; Potassium 4.5; Sodium 135    Lipid Panel Lab Results  Component Value Date   CHOL 95 09/04/2021   HDL 66 09/04/2021   LDLCALC 15 09/04/2021   TRIG 68 09/04/2021     Wt Readings from Last 3 Encounters:  02/18/23 129 lb (58.5 kg)  02/11/23 129 lb 14.4 oz (58.9 kg)  02/01/23 128 lb (58.1 kg)     ASSESSMENT AND PLAN:  Problem List Items Addressed This Visit       Cardiology Problems   Acute systolic heart failure (HCC) - Primary   Relevant Orders   EKG 12-Lead   CAD in native artery   Relevant Orders   EKG 12-Lead   Acute respiratory distress Recent hospitalization for shortness of breath symptoms felt to be multifactorial in the setting of COPD, chronic cough, cocaine,/pulmonary edema in the setting of poorly controlled blood pressure, medication noncompliance EF 30 to 35% He received several doses IV Lasix during his hospital course  with improved symptoms In follow-up today, worsening leg swelling, trace pitting noted  he is uncertain whether he is taking 10 mg of Lasix or 20 We have recommended that if he is taking 10 mg that he increase his dose up to 20 mg daily and decrease his fluid intake If he is taking Lasix 20 mg we have recommended that he increase Lasix up to 40 mg daily with decreased fluid intake -Scheduled to see pulmonary tomorrow  Acute on chronic renal failure Worsening renal dysfunction, most recent creatinine 1.8 in the setting of malignant hypertension, cocaine, cardiomyopathy Referral made to nephrology   Elevated troponin Ejection fraction 30 to 35%, had cardiac catheterization on recent hospital admission showing moderate nonobstructive coronary disease -Smoking cessation  recommended   Polysubstance abuse Smoker, alcohol, cocaine Cessation recommended   COPD/emphysema Long history of smoking since he was a teenager Smoking cessation recommended On Trelegy -Scheduled to see pulmonary tomorrow   Medication noncompliance Reports he is more compliant with his medications on today's visit, Though somewhat unclear which medications and what doses he is taking Recommend he keep an accurate list with him or bring his medications with him for future office follow-up     Total encounter time more than 40 minutes  Greater than 50% was spent in counseling and coordination of care with the patient    Signed, Dossie Arbour, M.D., Ph.D. [facility] [place], [place] [phone]

## 2023-02-19 ENCOUNTER — Encounter: Payer: Self-pay | Admitting: Student in an Organized Health Care Education/Training Program

## 2023-02-19 ENCOUNTER — Ambulatory Visit (INDEPENDENT_AMBULATORY_CARE_PROVIDER_SITE_OTHER): Payer: 59 | Admitting: Student in an Organized Health Care Education/Training Program

## 2023-02-19 VITALS — BP 128/62 | HR 98 | Temp 97.7°F | Ht 65.0 in | Wt 127.6 lb

## 2023-02-19 DIAGNOSIS — R0602 Shortness of breath: Secondary | ICD-10-CM

## 2023-02-19 DIAGNOSIS — Z87891 Personal history of nicotine dependence: Secondary | ICD-10-CM

## 2023-02-19 DIAGNOSIS — J432 Centrilobular emphysema: Secondary | ICD-10-CM

## 2023-02-19 MED ORDER — STIOLTO RESPIMAT 2.5-2.5 MCG/ACT IN AERS
2.0000 | INHALATION_SPRAY | Freq: Every day | RESPIRATORY_TRACT | 12 refills | Status: AC
Start: 2023-02-19 — End: 2024-02-19

## 2023-02-19 NOTE — Progress Notes (Signed)
Synopsis: Referred in for emphysema by Alba Cory, MD  Assessment & Plan:   #Shortness of breath #Emphysema  Presenting for the evaluation of shortness of breath with exertion as well as findings of emphysema on his chest CT.  He has a longstanding smoking history as well as crack cocaine use and I have counseled him at length on the importance of cessation of smoking and illicit drug use. Given his emphysema and shortness of breath, he is at high risk for the development of COPD which I will test for with a pulmonary function test.  Furthermore, I will start him on LABA/LAMA therapy to help with the symptoms as well as to decrease the risk of exacerbations.  - Tiotropium Bromide-Olodaterol (STIOLTO RESPIMAT) 2.5-2.5 MCG/ACT AERS; Inhale 2 puffs into the lungs daily.  Dispense: 60 each; Refill: 12 - Pulmonary Function Test [facility] Only; Future  #Personal history of tobacco use, presenting hazards to health  Patient with a longstanding history of smoking cigarettes as well as cocaine use and marijuana use.  Counseled him at length on the importance of cessation.  He has already quit smoking and plans to remain abstinent.  I encouraged him to abstain from cocaine use and marijuana use.   Return in about 3 months (around 05/22/2023).  I spent 45 minutes caring for this patient today, including preparing to see the patient, obtaining a medical history , reviewing a separately obtained history, performing a medically appropriate examination and/or evaluation, counseling and educating the patient/family/caregiver, ordering medications, tests, or procedures, and documenting clinical information in the electronic health record.  6 minutes spent on smoking and drug cessation counseling.  Raechel Chute, MD [facility] 02/19/2023 10:53 AM    End of visit medications:  Meds ordered this encounter  Medications   Tiotropium Bromide-Olodaterol (STIOLTO RESPIMAT) 2.5-2.5 MCG/ACT  AERS    Sig: Inhale 2 puffs into the lungs daily.    Dispense:  60 each    Refill:  12     Current Outpatient Medications:    atorvastatin (LIPITOR) 40 MG tablet, Take 1 tablet (40 mg total) by mouth at bedtime., Disp: 30 tablet, Rfl: 0   carvedilol (COREG) 3.125 MG tablet, Take 1 tablet (3.125 mg total) by mouth 2 (two) times daily with a meal., Disp: 60 tablet, Rfl: 0   divalproex (DEPAKOTE) 250 MG DR tablet, Take 1 tablet (250 mg total) by mouth in the morning and at bedtime. For mood, Disp: 180 tablet, Rfl: 1   EQ ASPIRIN ADULT LOW DOSE 81 MG EC tablet, Take 1 tablet by mouth once daily, Disp: 30 tablet, Rfl: 0   fluticasone (FLONASE) 50 MCG/ACT nasal spray, Place 2 sprays into both nostrils daily., Disp: 16 g, Rfl: 2   isosorbide-hydrALAZINE (BIDIL) 20-37.5 MG tablet, Take 1 tablet by mouth 3 (three) times daily., Disp: 90 tablet, Rfl: 0   losartan (COZAAR) 100 MG tablet, Take 100 mg by mouth daily., Disp: , Rfl:    Tiotropium Bromide-Olodaterol (STIOLTO RESPIMAT) 2.5-2.5 MCG/ACT AERS, Inhale 2 puffs into the lungs daily., Disp: 60 each, Rfl: 12   tiZANidine (ZANAFLEX) 2 MG tablet, Take 1 tablet (2 mg total) by mouth 2 (two) times daily as needed for muscle spasms., Disp: 180 tablet, Rfl: 1   furosemide (LASIX) 40 MG tablet, Take 1 tablet (40 mg total) by mouth daily. (Patient not taking: Reported on 02/19/2023), Disp: 30 tablet, Rfl: 6   triamcinolone cream (KENALOG) 0.1 %, Apply 1 application topically 2 (two) times daily. (  Patient not taking: Reported on 02/19/2023), Disp: 30 g, Rfl: 0   Subjective:   PATIENT ID: James Padilla GENDER: male DOB: 07/26/1966, MRN: [id number]  Chief Complaint  Patient presents with   pulmonary consult    Hx of Emphysema- SOB with exertion,     HPI  Patient is a 57 year old male presenting to clinic to establish care.  He has a longstanding history of smoking cigarettes as well as using crack cocaine and smoking marijuana.  He was previously  enrolled in our lung cancer screening program with imaging showing paraseptal and centrilobular emphysema consistent with his history of smoking.  Patient was most recently seen in the hospital in early June secondary to increased shortness of breath where he was noted to have hypertensive emergency, nonischemic cardiomyopathy, pulmonary edema, and kidney disease.  He was not noted to be in a COPD exacerbation at that point.  He tells me that he was unable to tolerate the Trelegy that was prescribed to him by his primary care physician and has quit taking it.  He feels that some of his symptoms resulting in admission were due to Trelegy use and he does not want to use it again.  Today, he presents to establish care.  He reports some exertional dyspnea but is unable to further elaborate on that.  He tells me that he sometimes feels shortness of breath going up a flight of stairs but does not feel much of it with regular activity.  He does have an occasional cough that is productive of yellow sputum.  He denies any chest pain or chest tightness, denies any wheezing, and denies any pleurisy.  Patient has not had any history of lung disease in the past and does not know what diagnosis he carries from his cardiologist.  Patient reports longstanding history of smoking but reports quitting a few weeks ago after his recent hospitalization.  He has around 30 to 35 pack years of smoking history.  Patient continues to use crack cocaine and tells me that his last time using it was a couple days ago.  He is also smoking marijuana.  Ancillary information including prior medications, full medical/surgical/family/social histories, and PFTs (when available) are listed below and have been reviewed.   Review of Systems  Constitutional:  Negative for chills, fever, malaise/fatigue and weight loss.  Respiratory:  Positive for cough, sputum production and shortness of breath. Negative for hemoptysis and wheezing.    Cardiovascular:  Negative for chest pain.     Objective:   Vitals:   02/19/23 1031  BP: 128/62  Pulse: 98  Temp: 97.7 F (36.5 C)  TempSrc: Temporal  SpO2: 94%  Weight: 127 lb 9.6 oz (57.9 kg)  Height: 5\' 5"  (1.651 m)   94% on RA BMI Readings from Last 3 Encounters:  02/19/23 21.23 kg/m  02/18/23 21.14 kg/m  02/11/23 22.30 kg/m   Wt Readings from Last 3 Encounters:  02/19/23 127 lb 9.6 oz (57.9 kg)  02/18/23 129 lb (58.5 kg)  02/11/23 129 lb 14.4 oz (58.9 kg)    Physical Exam Constitutional:      Appearance: Normal appearance.  HENT:     Head: Normocephalic.  Cardiovascular:     Rate and Rhythm: Normal rate and regular rhythm.     Pulses: Normal pulses.     Heart sounds: Normal heart sounds.  Pulmonary:     Effort: Pulmonary effort is normal. No respiratory distress.     Breath sounds: Normal breath sounds. No wheezing  or rales.  Neurological:     General: No focal deficit present.     Mental Status: He is alert and oriented to person, place, and time. Mental status is at baseline.       Ancillary Information    Past Medical History:  Diagnosis Date   Abscess 12/17/2018   Anxiety    Depression    Dyspnea    Hypertension    Neck pain    Weakness of extremity Right arm and hand     Family History  Problem Relation Age of Onset   Breast cancer Mother    Aneurysm Father      Past Surgical History:  Procedure Laterality Date   COLONOSCOPY WITH PROPOFOL N/A 10/15/2018   Procedure: COLONOSCOPY WITH PROPOFOL;  Surgeon: Wyline Mood, MD;  Location: [facility] ENDOSCOPY;  Service: Gastroenterology;  Laterality: N/A;   MULTIPLE TOOTH EXTRACTIONS     RIGHT/LEFT HEART CATH AND CORONARY ANGIOGRAPHY N/A 02/04/2023   Procedure: RIGHT/LEFT HEART CATH AND CORONARY ANGIOGRAPHY;  Surgeon: Iran Ouch, MD;  Location: [facility] INVASIVE CV LAB;  Service: Cardiovascular;  Laterality: N/A;    Social History   Socioeconomic History   Marital status: Single     Spouse name: Not on file   Number of children: 2   Years of education: 12   Highest education level: High school graduate  Occupational History   Occupation: unemployed  Tobacco Use   Smoking status: Former    Packs/day: 0.75    Years: 40.00    Additional pack years: 0.00    Total pack years: 30.00    Types: Cigarettes    Start date: 04/12/1982    Quit date: 02/01/2023    Years since quitting: 0.0    Passive exposure: Past   Smokeless tobacco: Never  Vaping Use   Vaping Use: Never used  Substance and Sexual Activity   Alcohol use: Yes    Comment: rarely    Drug use: Yes    Frequency: 2.0 times per week    Types: Marijuana, Cocaine    Comment: crack   Sexual activity: Not Currently    Partners: Female  Other Topics Concern   Not on file  Social History Narrative   Lives in a friends house, sofa    Social Determinants of Health   Financial Resource Strain: High Risk (12/01/2020)   Overall Financial Resource Strain (CARDIA)    Difficulty of Paying Living Expenses: Hard  Food Insecurity: No Food Insecurity (02/06/2023)   Hunger Vital Sign    Worried About Running Out of Food in the Last Year: Never true    Ran Out of Food in the Last Year: Never true  Transportation Needs: Unmet Transportation Needs (02/03/2023)   PRAPARE - Transportation    Lack of Transportation (Medical): Yes    Lack of Transportation (Non-Medical): Yes  Physical Activity: Inactive (12/01/2020)   Exercise Vital Sign    Days of Exercise per Week: 0 days    Minutes of Exercise per Session: 0 min  Stress: Stress Concern Present (12/01/2020)   [employer] of Occupational Health - Occupational Stress Questionnaire    Feeling of Stress : Very much  Social Connections: Unknown (12/01/2020)   Social Connection and Isolation Panel [NHANES]    Frequency of Communication with Friends and Family: Not on file    Frequency of Social Gatherings with Friends and Family: Never    Attends Religious Services: 1 to 4  times per year    Active Member  of Clubs or Organizations: Yes    Attends Banker Meetings: Never    Marital Status: Never married  Intimate Partner Violence: Not At Risk (02/03/2023)   Humiliation, Afraid, Rape, and Kick questionnaire    Fear of Current or Ex-Partner: No    Emotionally Abused: No    Physically Abused: No    Sexually Abused: No     Allergies  Allergen Reactions   Septra [Sulfamethoxazole-Trimethoprim] Other (See Comments)    Renal Failure   Lactose Nausea And Vomiting     CBC    Component Value Date/Time   WBC 5.7 02/03/2023 0634   RBC 4.44 02/03/2023 0634   HGB 14.3 02/04/2023 1039   HCT 42.0 02/04/2023 1039   PLT 163 02/03/2023 0634   MCV 98.6 02/03/2023 0634   MCH 33.8 02/03/2023 0634   MCHC 34.2 02/03/2023 0634   RDW 12.6 02/03/2023 0634   LYMPHSABS 3.9 02/01/2023 1043   MONOABS 0.5 02/01/2023 1043   EOSABS 0.1 02/01/2023 1043   BASOSABS 0.0 02/01/2023 1043    Pulmonary Functions Testing Results:     No data to display          Outpatient Medications Prior to Visit  Medication Sig Dispense Refill   atorvastatin (LIPITOR) 40 MG tablet Take 1 tablet (40 mg total) by mouth at bedtime. 30 tablet 0   carvedilol (COREG) 3.125 MG tablet Take 1 tablet (3.125 mg total) by mouth 2 (two) times daily with a meal. 60 tablet 0   divalproex (DEPAKOTE) 250 MG DR tablet Take 1 tablet (250 mg total) by mouth in the morning and at bedtime. For mood 180 tablet 1   EQ ASPIRIN ADULT LOW DOSE 81 MG EC tablet Take 1 tablet by mouth once daily 30 tablet 0   fluticasone (FLONASE) 50 MCG/ACT nasal spray Place 2 sprays into both nostrils daily. 16 g 2   isosorbide-hydrALAZINE (BIDIL) 20-37.5 MG tablet Take 1 tablet by mouth 3 (three) times daily. 90 tablet 0   losartan (COZAAR) 100 MG tablet Take 100 mg by mouth daily.     tiZANidine (ZANAFLEX) 2 MG tablet Take 1 tablet (2 mg total) by mouth 2 (two) times daily as needed for muscle spasms. 180 tablet 1    Fluticasone-Umeclidin-Vilant (TRELEGY ELLIPTA) 100-62.5-25 MCG/ACT AEPB Inhale 1 puff into the lungs daily. 3 each 1   furosemide (LASIX) 40 MG tablet Take 1 tablet (40 mg total) by mouth daily. (Patient not taking: Reported on 02/19/2023) 30 tablet 6   triamcinolone cream (KENALOG) 0.1 % Apply 1 application topically 2 (two) times daily. (Patient not taking: Reported on 02/19/2023) 30 g 0   No facility-administered medications prior to visit.

## 2023-02-26 ENCOUNTER — Telehealth: Payer: Self-pay | Admitting: Cardiovascular Disease

## 2023-02-26 NOTE — Telephone Encounter (Signed)
-----   Message from [name], RN sent at 02/18/2023  5:28 PM EDT ----- Can you schedule James Padilla for his 6 month follow up? It was after 5 when he left today.   Thank you, Lerry Liner

## 2023-02-26 NOTE — Telephone Encounter (Signed)
Patient contacted 3x to schedule 6 month f/u with no success. Sending letter.

## 2023-02-28 NOTE — Progress Notes (Signed)
Order(s) created erroneously. Erroneous order ID: [id number]  Order moved by: Ian Malkin  Order move date/time: 02/28/2023 1:48 PM  Source Patient: [id number]  Source Contact: 02/18/2023  Destination Patient: [id number]  Destination Contact: 02/05/2023

## 2023-03-12 DIAGNOSIS — H04123 Dry eye syndrome of bilateral lacrimal glands: Secondary | ICD-10-CM | POA: Diagnosis not present

## 2023-03-25 ENCOUNTER — Telehealth: Payer: Self-pay | Admitting: Emergency Medicine

## 2023-03-25 NOTE — Telephone Encounter (Signed)
Opened in error

## 2023-03-28 ENCOUNTER — Other Ambulatory Visit: Payer: Self-pay | Admitting: Family Medicine

## 2023-03-28 MED ORDER — ATORVASTATIN CALCIUM 40 MG PO TABS: 40.0000 mg | ORAL_TABLET | Freq: Every day | ORAL | 0 refills | Status: AC

## 2023-04-29 ENCOUNTER — Ambulatory Visit: Payer: 59 | Admitting: Cardiovascular Disease

## 2023-05-02 ENCOUNTER — Ambulatory Visit: Payer: 59 | Admitting: Family Medicine

## 2023-05-13 NOTE — Progress Notes (Deleted)
Name: James Padilla   MRN: [id number]    DOB: 11-23-1965   Date:05/13/2023       Progress Note  Subjective  Chief Complaint  Follow Up  HPI  HTN: taking losartan hctz and Atenolol 25 mg , bp is slightly elevated today, he states he was worried about his transportation today. BP has been elevated lately and we will adjust dose of losartan   Centribular emphysema: he has a morning cough, that is productive ans has some SOB with activity , he has been a smoker since his late teenage years. He has been smoking more lately, up to one pack daily. He states he is trying to get a car - and due an old DUI he will need to be able to use a breathalyzer before the engine starts. He asked me to write a note for him to not use the breathalyzer, explained he will need to see pulmonologist and he agreed    Chronic neck/cervical myelopathy  and back pain: he has a long history of neuropathy, he has tingling on both arms and some myoclonus. He states has difficulty working due to the pain, waiting for disability, lives in friend's house, sleeps on a sofa. He had neck surgery scheduled for Summer 2020 but cancelled once due to COVID-19 and once due to positive drug screen for crack cocaine and was dismissed from Dr. Marcell Barlow. He stopped taking gabapentin - states not effective, taking Tizanidine . He uses a cane intermittently   Amnesia: he states sometimes he goes to bed and wakes up in the living the room, does not recall what happens in between. Discussed seeing a neurologist but he is not interested at this time   Atherosclerosis of aorta: on statin therapy, denies side effects, Last LDL was below 30 , we will decrease dose from 20 mg to 10 mg . He also has coronary artery disease he has occasional chest tightness - he cannot elaborate   Cocaine drug use: he has been smoking crack cocaine since 1986. He is now using it intermittently - last use was this past weekend.   MDD: he takes Depakote but mostly for  anger. He does not want to add another medication at this time , he also would not be a good candidate since still using cocaine.  He is on his own place now, trying to get a car. He still has relapses and smokes crack cocaine intermittently   Rhinorrhea: he states he has noticed rhinorrhea for the past few months, he states he never snorted drugs, we will try flonase   Patient Active Problem List   Diagnosis Date Noted   Hyperlipidemia 02/11/2023   Non-ST elevation (NSTEMI) myocardial infarction (HCC) 02/05/2023   Acute systolic heart failure (HCC) 02/04/2023   Hypertensive emergency 02/02/2023   Acute pulmonary edema (HCC) 02/02/2023   Elevated troponin 02/02/2023   Hypertensive urgency 02/01/2023   Cardiomyopathy (HCC) 02/01/2023   ETOH abuse 02/01/2023   AKI (acute kidney injury) (HCC) 02/01/2023   Cocaine abuse (HCC) 01/10/2022   Cervical myelopathy (HCC) 01/10/2022   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CC) 01/10/2022   Centrilobular emphysema (HCC) 10/04/2020   Atherosclerosis of aorta (HCC) 10/04/2020   CAD in native artery 10/04/2020   Neuropathy 08/29/2020   Hypertension, benign 01/04/2019   Sebaceous cyst 12/17/2018   Nonintractable episodic headache 10/05/2018   Tobacco abuse counseling 10/05/2018   Irritability and anger 10/05/2018   Chronic neck pain 09/19/2018   Chronic bilateral back  pain 09/19/2018   Right arm pain 08/14/2018    Past Surgical History:  Procedure Laterality Date   COLONOSCOPY WITH PROPOFOL N/A 10/15/2018   Procedure: COLONOSCOPY WITH PROPOFOL;  Surgeon: Wyline Mood, MD;  Location: [facility] ENDOSCOPY;  Service: Gastroenterology;  Laterality: N/A;   MULTIPLE TOOTH EXTRACTIONS     RIGHT/LEFT HEART CATH AND CORONARY ANGIOGRAPHY N/A 02/04/2023   Procedure: RIGHT/LEFT HEART CATH AND CORONARY ANGIOGRAPHY;  Surgeon: Iran Ouch, MD;  Location: [facility] INVASIVE CV LAB;  Service: Cardiovascular;  Laterality: N/A;    Family History   Problem Relation Age of Onset   Breast cancer Mother    Aneurysm Father     Social History   Tobacco Use   Smoking status: Former    Current packs/day: 0.00    Average packs/day: 0.8 packs/day for 40.8 years (30.6 ttl pk-yrs)    Types: Cigarettes    Start date: 04/12/1982    Quit date: 02/01/2023    Years since quitting: 0.2    Passive exposure: Past   Smokeless tobacco: Never  Substance Use Topics   Alcohol use: Yes    Comment: rarely      Current Outpatient Medications:    atorvastatin (LIPITOR) 40 MG tablet, Take 1 tablet (40 mg total) by mouth at bedtime., Disp: 90 tablet, Rfl: 0   carvedilol (COREG) 3.125 MG tablet, Take 1 tablet (3.125 mg total) by mouth 2 (two) times daily with a meal., Disp: 60 tablet, Rfl: 0   divalproex (DEPAKOTE) 250 MG DR tablet, Take 1 tablet (250 mg total) by mouth in the morning and at bedtime. For mood, Disp: 180 tablet, Rfl: 1   EQ ASPIRIN ADULT LOW DOSE 81 MG EC tablet, Take 1 tablet by mouth once daily, Disp: 30 tablet, Rfl: 0   fluticasone (FLONASE) 50 MCG/ACT nasal spray, Place 2 sprays into both nostrils daily., Disp: 16 g, Rfl: 2   furosemide (LASIX) 40 MG tablet, Take 1 tablet (40 mg total) by mouth daily. (Patient not taking: Reported on 02/19/2023), Disp: 30 tablet, Rfl: 6   losartan (COZAAR) 100 MG tablet, Take 100 mg by mouth daily., Disp: , Rfl:    Tiotropium Bromide-Olodaterol (STIOLTO RESPIMAT) 2.5-2.5 MCG/ACT AERS, Inhale 2 puffs into the lungs daily., Disp: 60 each, Rfl: 12   tiZANidine (ZANAFLEX) 2 MG tablet, Take 1 tablet (2 mg total) by mouth 2 (two) times daily as needed for muscle spasms., Disp: 180 tablet, Rfl: 1   triamcinolone cream (KENALOG) 0.1 %, Apply 1 application topically 2 (two) times daily. (Patient not taking: Reported on 02/19/2023), Disp: 30 g, Rfl: 0  Allergies  Allergen Reactions   Septra [Sulfamethoxazole-Trimethoprim] Other (See Comments)    Renal Failure   Lactose Nausea And Vomiting    I personally  reviewed active problem list, medication list, allergies, family history, social history, health maintenance with the patient/caregiver today.   ROS  ***  Objective  There were no vitals filed for this visit.  There is no height or weight on file to calculate BMI.  Physical Exam ***  No results found for this or any previous visit (from the past 2160 hour(s)).   PHQ2/9:    02/11/2023    2:03 PM 01/30/2023    7:49 AM 08/05/2022    3:46 PM 06/04/2022    2:52 PM 03/01/2022    3:31 PM  Depression screen PHQ 2/9  Decreased Interest 0 0 0 1 2  Down, Depressed, Hopeless 0 0 0 1 0  PHQ - 2  Score 0 0 0 2 2  Altered sleeping 0 3 0 1 1  Tired, decreased energy 0 2 0 1 1  Change in appetite 0 0 0 0 1  Feeling bad or failure about yourself  0 0 0 0 0  Trouble concentrating 0 0 0 0 1  Moving slowly or fidgety/restless 0 0 0 0 0  Suicidal thoughts 0 0 0 0 0  PHQ-9 Score 0 5 0 4 6  Difficult doing work/chores Not difficult at all  Not difficult at all  Not difficult at all    phq 9 is {gen pos [id number]}   Fall Risk:    02/11/2023    2:03 PM 01/30/2023    7:48 AM 08/05/2022    3:43 PM 06/04/2022    2:52 PM 03/01/2022    3:31 PM  Fall Risk   Falls in the past year? 1 1 1 1 1   Number falls in past yr: 1 1 1 1 1   Injury with Fall? 1 1 0 0 1  Risk for fall due to : Impaired balance/gait No Fall Risks  No Fall Risks History of fall(s)  Follow up Falls prevention discussed;Education provided;Falls evaluation completed Falls prevention discussed  Falls prevention discussed Education provided;Falls prevention discussed;Falls evaluation completed      Functional Status Survey:      Assessment & Plan  *** There are no diagnoses linked to this encounter.

## 2023-05-14 ENCOUNTER — Ambulatory Visit: Payer: 59 | Admitting: Family Medicine

## 2023-05-14 DIAGNOSIS — Z23 Encounter for immunization: Secondary | ICD-10-CM

## 2023-05-14 DIAGNOSIS — J432 Centrilobular emphysema: Secondary | ICD-10-CM

## 2023-05-26 ENCOUNTER — Telehealth: Payer: Self-pay | Admitting: Family Medicine

## 2023-05-26 ENCOUNTER — Ambulatory Visit: Payer: 59 | Admitting: Student in an Organized Health Care Education/Training Program

## 2023-05-26 NOTE — Telephone Encounter (Signed)
Spoke to patient's daughter, she was inquiring about any recent labs/results as patient is currently incarcerated per her. I let her know Dr. Carlynn Purl is out of the office this week and there were no recent labs/encounters in our office to discuss with her. She stated she had gotten a call from the patient and he was unsure if he had missed anything new.

## 2023-05-26 NOTE — Telephone Encounter (Unsigned)
Copied from CRM [id number]. Topic: General - Other >> May 26, 2023  3:03 PM Santiya F wrote: Reason for CRM: Pt's daughter Glee Arvin is calling in requesting a call back from Dr. Carlynn Purl regarding this pt.

## 2023-09-03 ENCOUNTER — Encounter: Payer: Self-pay | Admitting: Acute Care

## 2024-03-11 ENCOUNTER — Encounter: Payer: Self-pay | Admitting: Cardiovascular Disease

## 2024-07-26 DEATH — deceased
